# Patient Record
Sex: Female | Born: 1935
Health system: Southern US, Community
[De-identification: ages and names within clinical notes are randomized; demographics above are authoritative.]

## PROBLEM LIST (undated history)

## (undated) DIAGNOSIS — Z8719 Personal history of other diseases of the digestive system: Secondary | ICD-10-CM

## (undated) DIAGNOSIS — K219 Gastro-esophageal reflux disease without esophagitis: Secondary | ICD-10-CM

## (undated) DIAGNOSIS — I440 Atrioventricular block, first degree: Secondary | ICD-10-CM

## (undated) DIAGNOSIS — M25562 Pain in left knee: Secondary | ICD-10-CM

## (undated) DIAGNOSIS — M1990 Unspecified osteoarthritis, unspecified site: Secondary | ICD-10-CM

## (undated) DIAGNOSIS — I4891 Unspecified atrial fibrillation: Secondary | ICD-10-CM

## (undated) DIAGNOSIS — E119 Type 2 diabetes mellitus without complications: Secondary | ICD-10-CM

## (undated) DIAGNOSIS — E785 Hyperlipidemia, unspecified: Secondary | ICD-10-CM

## (undated) DIAGNOSIS — Z974 Presence of external hearing-aid: Secondary | ICD-10-CM

## (undated) DIAGNOSIS — I10 Essential (primary) hypertension: Secondary | ICD-10-CM

## (undated) DIAGNOSIS — IMO0001 Reserved for inherently not codable concepts without codable children: Secondary | ICD-10-CM

## (undated) DIAGNOSIS — H9190 Unspecified hearing loss, unspecified ear: Secondary | ICD-10-CM

## (undated) DIAGNOSIS — Z9889 Other specified postprocedural states: Secondary | ICD-10-CM

## (undated) DIAGNOSIS — S838X2A Sprain of other specified parts of left knee, initial encounter: Secondary | ICD-10-CM

## (undated) HISTORY — PX: CATARACT EXTRACTION W/ INTRAOCULAR LENS IMPLANT: SHX1309

---

## 1959-05-17 HISTORY — PX: ABDOMINAL HYSTERECTOMY: SHX81

## 1998-02-28 ENCOUNTER — Ambulatory Visit (HOSPITAL_COMMUNITY): Admission: RE | Admit: 1998-02-28 | Discharge: 1998-02-28 | Payer: Self-pay | Admitting: Family Medicine

## 1998-03-29 ENCOUNTER — Ambulatory Visit: Admission: RE | Admit: 1998-03-29 | Discharge: 1998-03-29 | Payer: Self-pay | Admitting: Family Medicine

## 1998-07-25 ENCOUNTER — Ambulatory Visit (HOSPITAL_COMMUNITY): Admission: RE | Admit: 1998-07-25 | Discharge: 1998-07-25 | Payer: Self-pay | Admitting: Internal Medicine

## 1998-11-26 ENCOUNTER — Ambulatory Visit (HOSPITAL_COMMUNITY): Admission: RE | Admit: 1998-11-26 | Discharge: 1998-11-26 | Payer: Self-pay | Admitting: Gastroenterology

## 1998-11-26 ENCOUNTER — Encounter: Payer: Self-pay | Admitting: Gastroenterology

## 1998-11-29 ENCOUNTER — Ambulatory Visit (HOSPITAL_COMMUNITY): Admission: RE | Admit: 1998-11-29 | Discharge: 1998-11-29 | Payer: Self-pay | Admitting: Gastroenterology

## 1998-11-29 ENCOUNTER — Encounter: Payer: Self-pay | Admitting: Gastroenterology

## 2000-01-14 ENCOUNTER — Ambulatory Visit (HOSPITAL_COMMUNITY): Admission: RE | Admit: 2000-01-14 | Discharge: 2000-01-14 | Payer: Self-pay | Admitting: Internal Medicine

## 2001-08-25 ENCOUNTER — Ambulatory Visit (HOSPITAL_COMMUNITY): Admission: RE | Admit: 2001-08-25 | Discharge: 2001-08-25 | Payer: Self-pay | Admitting: *Deleted

## 2002-08-26 ENCOUNTER — Ambulatory Visit (HOSPITAL_COMMUNITY): Admission: RE | Admit: 2002-08-26 | Discharge: 2002-08-26 | Payer: Self-pay | Admitting: Family Medicine

## 2002-08-26 ENCOUNTER — Encounter: Payer: Self-pay | Admitting: Family Medicine

## 2003-10-09 ENCOUNTER — Ambulatory Visit (HOSPITAL_COMMUNITY): Admission: RE | Admit: 2003-10-09 | Discharge: 2003-10-09 | Payer: Self-pay | Admitting: Family Medicine

## 2005-05-27 ENCOUNTER — Encounter: Admission: RE | Admit: 2005-05-27 | Discharge: 2005-08-25 | Payer: Self-pay | Admitting: Family Medicine

## 2006-04-16 ENCOUNTER — Encounter: Admission: RE | Admit: 2006-04-16 | Discharge: 2006-04-28 | Payer: Self-pay | Admitting: Family Medicine

## 2007-06-23 ENCOUNTER — Ambulatory Visit (HOSPITAL_COMMUNITY): Admission: RE | Admit: 2007-06-23 | Discharge: 2007-06-23 | Payer: Self-pay | Admitting: *Deleted

## 2008-07-05 ENCOUNTER — Ambulatory Visit (HOSPITAL_COMMUNITY): Admission: RE | Admit: 2008-07-05 | Discharge: 2008-07-05 | Payer: Self-pay | Admitting: Family Medicine

## 2009-09-15 HISTORY — PX: TOTAL KNEE ARTHROPLASTY: SHX125

## 2011-10-22 DIAGNOSIS — H26499 Other secondary cataract, unspecified eye: Secondary | ICD-10-CM | POA: Diagnosis not present

## 2011-10-22 DIAGNOSIS — H2510 Age-related nuclear cataract, unspecified eye: Secondary | ICD-10-CM | POA: Diagnosis not present

## 2011-11-06 DIAGNOSIS — E7800 Pure hypercholesterolemia, unspecified: Secondary | ICD-10-CM | POA: Diagnosis not present

## 2011-11-06 DIAGNOSIS — Z23 Encounter for immunization: Secondary | ICD-10-CM | POA: Diagnosis not present

## 2011-11-06 DIAGNOSIS — E119 Type 2 diabetes mellitus without complications: Secondary | ICD-10-CM | POA: Diagnosis not present

## 2011-11-06 DIAGNOSIS — I10 Essential (primary) hypertension: Secondary | ICD-10-CM | POA: Diagnosis not present

## 2012-01-06 DIAGNOSIS — M1710 Unilateral primary osteoarthritis, unspecified knee: Secondary | ICD-10-CM | POA: Diagnosis not present

## 2012-04-20 DIAGNOSIS — E559 Vitamin D deficiency, unspecified: Secondary | ICD-10-CM | POA: Diagnosis not present

## 2012-04-20 DIAGNOSIS — I10 Essential (primary) hypertension: Secondary | ICD-10-CM | POA: Diagnosis not present

## 2012-04-20 DIAGNOSIS — E7800 Pure hypercholesterolemia, unspecified: Secondary | ICD-10-CM | POA: Diagnosis not present

## 2012-04-20 DIAGNOSIS — Z0000 Encounter for general adult medical examination without abnormal findings: Secondary | ICD-10-CM | POA: Diagnosis not present

## 2012-04-20 DIAGNOSIS — Z01419 Encounter for gynecological examination (general) (routine) without abnormal findings: Secondary | ICD-10-CM | POA: Diagnosis not present

## 2012-04-20 DIAGNOSIS — E119 Type 2 diabetes mellitus without complications: Secondary | ICD-10-CM | POA: Diagnosis not present

## 2012-06-07 DIAGNOSIS — Z23 Encounter for immunization: Secondary | ICD-10-CM | POA: Diagnosis not present

## 2012-10-04 DIAGNOSIS — E119 Type 2 diabetes mellitus without complications: Secondary | ICD-10-CM | POA: Diagnosis not present

## 2012-10-04 DIAGNOSIS — E559 Vitamin D deficiency, unspecified: Secondary | ICD-10-CM | POA: Diagnosis not present

## 2012-10-04 DIAGNOSIS — E7800 Pure hypercholesterolemia, unspecified: Secondary | ICD-10-CM | POA: Diagnosis not present

## 2012-10-04 DIAGNOSIS — I10 Essential (primary) hypertension: Secondary | ICD-10-CM | POA: Diagnosis not present

## 2012-12-28 DIAGNOSIS — H3530 Unspecified macular degeneration: Secondary | ICD-10-CM | POA: Diagnosis not present

## 2012-12-28 DIAGNOSIS — H35379 Puckering of macula, unspecified eye: Secondary | ICD-10-CM | POA: Diagnosis not present

## 2012-12-28 DIAGNOSIS — E119 Type 2 diabetes mellitus without complications: Secondary | ICD-10-CM | POA: Diagnosis not present

## 2012-12-28 DIAGNOSIS — H2510 Age-related nuclear cataract, unspecified eye: Secondary | ICD-10-CM | POA: Diagnosis not present

## 2013-01-04 DIAGNOSIS — H3581 Retinal edema: Secondary | ICD-10-CM | POA: Diagnosis not present

## 2013-01-04 DIAGNOSIS — H35379 Puckering of macula, unspecified eye: Secondary | ICD-10-CM | POA: Diagnosis not present

## 2013-01-04 DIAGNOSIS — H353190 Nonexudative age-related macular degeneration, unspecified eye, stage unspecified: Secondary | ICD-10-CM | POA: Diagnosis not present

## 2013-03-04 DIAGNOSIS — M1710 Unilateral primary osteoarthritis, unspecified knee: Secondary | ICD-10-CM | POA: Diagnosis not present

## 2013-03-11 DIAGNOSIS — M1710 Unilateral primary osteoarthritis, unspecified knee: Secondary | ICD-10-CM | POA: Diagnosis not present

## 2013-03-21 DIAGNOSIS — M1710 Unilateral primary osteoarthritis, unspecified knee: Secondary | ICD-10-CM | POA: Diagnosis not present

## 2013-03-24 ENCOUNTER — Other Ambulatory Visit: Payer: Self-pay | Admitting: Surgical

## 2013-03-29 DIAGNOSIS — Z01810 Encounter for preprocedural cardiovascular examination: Secondary | ICD-10-CM | POA: Diagnosis not present

## 2013-04-06 ENCOUNTER — Encounter (HOSPITAL_BASED_OUTPATIENT_CLINIC_OR_DEPARTMENT_OTHER): Payer: Self-pay | Admitting: *Deleted

## 2013-04-07 ENCOUNTER — Encounter (HOSPITAL_BASED_OUTPATIENT_CLINIC_OR_DEPARTMENT_OTHER): Payer: Self-pay | Admitting: *Deleted

## 2013-04-07 NOTE — Progress Notes (Addendum)
SPOKE W/ PT DAUGHTER, [name]. PT IS HOH OVER PHONE EVEN WITH HEARING AIDS. NPO AFTER MN. ARRIVES AT 1030. NEEDS ISTAT. CURRENT EKG TO BE FAXED FROM [facility] W/ [facility] [phone]. DAUGHTER TO CALL BACK W/ MED. LIST.  PT DAUGHTER , [name], CALLED BACK W/ MED LIST.  INSTRUCTIONS GIVEN FOR PT TO TAKE ATENOLOL AM OF SURG W/ SIP OF WATER.

## 2013-04-10 NOTE — H&P (Signed)
Rachel Hines DOB: 13-Sep-1931  Chief Complaint: left knee pain  History of Present Illness The patient is a 79 year old female who presents with knee complaints. Rachel Hines presented with a chief complaint of left knee pain. In April, she twisted her left knee "in an awkward way" and, since then, she's had left knee pain. She reports before that time she did have occasional knee pain in both knees but since this twisting injury she has had increased pain specifically in the left knee. She reports that she has had some swelling. All the pain is with weight bearing. No pain at rest. She denies locking and catching in the knee. She reports that she does not have any groin pain and no pain coming from her back. No numbness or tingling in the lower extremities.She had a cortisone injection in the left knee which did not get her relief.  MRI shows maceration with extrusion of the medial meniscus and partial tear of the lateral meniscus left knee. This is rather severe. She also has significant tricompartmental arthritis.    Allergies No Known Drug Allergies   Family History Cerebrovascular Accident. mother Congestive Heart Failure. mother Diabetes Mellitus. mother Heart Disease. mother Heart disease in female family member before age 76 Hypertension. mother   Social History Alcohol use. never consumed alcohol Children. 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 widowed Tobacco use. never smoker   Medication History Atenolol (100MG  Tablet, Oral) Active. Irbesartan (300MG  Tablet, Oral) Active. MetFORMIN HCl ER (500MG  Tablet ER 24HR, Oral) Active. Welchol (625MG  Tablet, Oral) Active. Zolpidem Tartrate (10MG  Tablet, Oral) Active.   Past Surgical History Arthroscopy of Knee. right Cataract Surgery. right Hysterectomy. partial (non-cancerous) Tonsillectomy   Past Medical History Diabetes Mellitus, Type  II Gastroesophageal Reflux Disease Hypertension Hypercholesterolemia Osteoporosis   Review of Systems General:Not Present- Chills, Fever, Night Sweats, Appetite Loss, Fatigue, Feeling sick, Weight Gain and Weight Loss. Skin:Not Present- Itching, Rash, Skin Color Changes, Ulcer, Psoriasis and Change in Hair or Nails. HEENT:Present- Sensitivity to light. Not Present- Hearing problems, Nose Bleed and Ringing in the Ears. Neck:Not Present- Swollen Glands and Neck Mass. Respiratory:Not Present- Snoring, Chronic Cough, Bloody sputum and Dyspnea. Cardiovascular:Not Present- Shortness of Breath, Chest Pain, Swelling of Extremities, Leg Cramps and Palpitations. Gastrointestinal:Present- Heartburn. Not Present- Bloody Stool, Abdominal Pain, Vomiting, Nausea and Incontinence of Stool. Female Genitourinary:Not Present- Blood in Urine, Menstrual Irregularities, Frequency, Incontinence and Nocturia. Musculoskeletal:Present- Joint Swelling and Joint Pain. Not Present- Muscle Weakness, Muscle Pain, Joint Stiffness and Back Pain. Neurological:Not Present- Tingling, Numbness, Burning, Tremor, Headaches and Dizziness. Psychiatric:Not Present- Anxiety, Depression and Memory Loss. Endocrine:Not Present- Cold Intolerance, Heat Intolerance, Excessive hunger and Excessive Thirst. Hematology:Not Present- Abnormal Bleeding, Anemia, Blood Clots and Easy Bruising.   Vitals Weight: 187.5 lb Height: 62 in Body Surface Area: 1.93 m Body Mass Index: 34.29 kg/m Pulse: 62 (Regular) BP: 156/76 (Sitting, Left Arm, Standard)    Objective  On physical exam, she's alert and oriented. She's in no acute distress. The right knee is slightly tender over the medial compartment. No swelling. No joint effusion. Ligaments are intact. Pain with ROM. The left knee is tender also over the medial joint line. There is some mild soft tissue swelling but no joint effusion. Ligaments are also intact in  this knee. Sensation and circulation are intact in the lower extremities. There are no motor deficits in the lower extremities. The calves are soft, nontender. There is no pain with ROM of the hips, and  she is not tender over either troch. bursa.On auscultating her lungs, the lungs are clear. Heart is normal sinus rhythm, no murmur. Neck supple, no bruit. EOM intact. Abdomen soft and nontender.     RADIOGRAPHS: AP and lateral views of the knee show that she has lateral and medial joint space narrowing in the right knee as well as medial joint space narrowing in the left knee. She is nearly, if not already, bone on bone in the left knee and the medial compartment. There are no other fractures noted on x-ray.    Assessment & Plan Left knee, osteoarthritis Left knee, medial and lateral meniscus tears She needs to have an arthroscopic clean out and meniscectomy. Dr. Darrelyn Hillock dicussed the risks and benefits of the surgery with the patient and her family. This will be an outpatient procedure.      Dimitri Ped, PA-C

## 2013-04-14 ENCOUNTER — Encounter (HOSPITAL_BASED_OUTPATIENT_CLINIC_OR_DEPARTMENT_OTHER): Payer: Self-pay | Admitting: *Deleted

## 2013-04-14 ENCOUNTER — Encounter (HOSPITAL_BASED_OUTPATIENT_CLINIC_OR_DEPARTMENT_OTHER)
Admission: RE | Disposition: A | Discharge status: Home / Self Care | Payer: Self-pay | Source: Ambulatory Visit | Attending: Orthopedic Surgery

## 2013-04-14 ENCOUNTER — Ambulatory Visit (HOSPITAL_BASED_OUTPATIENT_CLINIC_OR_DEPARTMENT_OTHER): Payer: Medicare Other | Admitting: Certified Registered"

## 2013-04-14 ENCOUNTER — Encounter (HOSPITAL_BASED_OUTPATIENT_CLINIC_OR_DEPARTMENT_OTHER): Payer: Self-pay | Admitting: Certified Registered"

## 2013-04-14 ENCOUNTER — Ambulatory Visit (HOSPITAL_BASED_OUTPATIENT_CLINIC_OR_DEPARTMENT_OTHER)
Admission: RE | Admit: 2013-04-14 | Discharge: 2013-04-14 | Disposition: A | Discharge status: Home / Self Care | Payer: Medicare Other | Source: Ambulatory Visit | Attending: Orthopedic Surgery | Admitting: Orthopedic Surgery

## 2013-04-14 DIAGNOSIS — K219 Gastro-esophageal reflux disease without esophagitis: Secondary | ICD-10-CM | POA: Insufficient documentation

## 2013-04-14 DIAGNOSIS — S83289A Other tear of lateral meniscus, current injury, unspecified knee, initial encounter: Secondary | ICD-10-CM | POA: Diagnosis not present

## 2013-04-14 DIAGNOSIS — IMO0002 Reserved for concepts with insufficient information to code with codable children: Secondary | ICD-10-CM | POA: Insufficient documentation

## 2013-04-14 DIAGNOSIS — X500XXA Overexertion from strenuous movement or load, initial encounter: Secondary | ICD-10-CM | POA: Insufficient documentation

## 2013-04-14 DIAGNOSIS — Z79899 Other long term (current) drug therapy: Secondary | ICD-10-CM | POA: Diagnosis not present

## 2013-04-14 DIAGNOSIS — E119 Type 2 diabetes mellitus without complications: Secondary | ICD-10-CM | POA: Diagnosis not present

## 2013-04-14 DIAGNOSIS — Z8249 Family history of ischemic heart disease and other diseases of the circulatory system: Secondary | ICD-10-CM | POA: Insufficient documentation

## 2013-04-14 DIAGNOSIS — E7800 Pure hypercholesterolemia, unspecified: Secondary | ICD-10-CM | POA: Diagnosis not present

## 2013-04-14 DIAGNOSIS — M2390 Unspecified internal derangement of unspecified knee: Secondary | ICD-10-CM | POA: Diagnosis not present

## 2013-04-14 DIAGNOSIS — M810 Age-related osteoporosis without current pathological fracture: Secondary | ICD-10-CM | POA: Diagnosis not present

## 2013-04-14 DIAGNOSIS — Z823 Family history of stroke: Secondary | ICD-10-CM | POA: Insufficient documentation

## 2013-04-14 DIAGNOSIS — M23269 Derangement of other lateral meniscus due to old tear or injury, unspecified knee: Secondary | ICD-10-CM | POA: Diagnosis present

## 2013-04-14 DIAGNOSIS — I10 Essential (primary) hypertension: Secondary | ICD-10-CM | POA: Insufficient documentation

## 2013-04-14 DIAGNOSIS — M23205 Derangement of unspecified medial meniscus due to old tear or injury, unspecified knee: Secondary | ICD-10-CM | POA: Diagnosis present

## 2013-04-14 DIAGNOSIS — M1710 Unilateral primary osteoarthritis, unspecified knee: Secondary | ICD-10-CM | POA: Insufficient documentation

## 2013-04-14 DIAGNOSIS — M1712 Unilateral primary osteoarthritis, left knee: Secondary | ICD-10-CM | POA: Diagnosis present

## 2013-04-14 DIAGNOSIS — Y929 Unspecified place or not applicable: Secondary | ICD-10-CM | POA: Insufficient documentation

## 2013-04-14 HISTORY — DX: Essential (primary) hypertension: I10

## 2013-04-14 HISTORY — DX: Hyperlipidemia, unspecified: E78.5

## 2013-04-14 HISTORY — DX: Type 2 diabetes mellitus without complications: E11.9

## 2013-04-14 HISTORY — DX: Reserved for inherently not codable concepts without codable children: IMO0001

## 2013-04-14 HISTORY — DX: Unspecified osteoarthritis, unspecified site: M19.90

## 2013-04-14 HISTORY — DX: Sprain of other specified parts of left knee, initial encounter: S83.8X2A

## 2013-04-14 HISTORY — DX: Personal history of other diseases of the digestive system: Z87.19

## 2013-04-14 HISTORY — DX: Presence of external hearing-aid: Z97.4

## 2013-04-14 HISTORY — DX: Unspecified hearing loss, unspecified ear: H91.90

## 2013-04-14 HISTORY — DX: Gastro-esophageal reflux disease without esophagitis: K21.9

## 2013-04-14 HISTORY — DX: Pain in left knee: M25.562

## 2013-04-14 HISTORY — DX: Atrioventricular block, first degree: I44.0

## 2013-04-14 HISTORY — DX: Other specified postprocedural states: Z98.890

## 2013-04-14 HISTORY — PX: KNEE ARTHROSCOPY WITH LATERAL MENISECTOMY: SHX6193

## 2013-04-14 HISTORY — PX: CHONDROPLASTY: SHX5177

## 2013-04-14 HISTORY — PX: KNEE ARTHROSCOPY WITH MEDIAL MENISECTOMY: SHX5651

## 2013-04-14 LAB — POCT I-STAT 4, (NA,K, GLUC, HGB,HCT)
HCT: 42 % (ref 36.0–46.0)
Sodium: 142 mEq/L (ref 135–145)

## 2013-04-14 SURGERY — ARTHROSCOPY, KNEE, WITH MEDIAL MENISCECTOMY
Anesthesia: General | Site: Knee | Laterality: Left | Wound class: Clean

## 2013-04-14 MED ORDER — LIDOCAINE HCL (CARDIAC) 20 MG/ML IV SOLN
INTRAVENOUS | Status: DC | PRN
  Administered 2013-04-14: 50 mg via INTRAVENOUS

## 2013-04-14 MED ORDER — LACTATED RINGERS IV SOLN
INTRAVENOUS | Status: DC
  Filled 2013-04-14: qty 1000

## 2013-04-14 MED ORDER — OXYCODONE-ACETAMINOPHEN 10-325 MG PO TABS: 1.0000 | ORAL_TABLET | ORAL | Status: DC | PRN

## 2013-04-14 MED ORDER — CHLORHEXIDINE GLUCONATE 4 % EX LIQD
60.0000 mL | Freq: Once | CUTANEOUS | Status: DC
  Filled 2013-04-14: qty 60

## 2013-04-14 MED ORDER — PROPOFOL 10 MG/ML IV BOLUS
INTRAVENOUS | Status: DC | PRN
  Administered 2013-04-14: 160 mg via INTRAVENOUS

## 2013-04-14 MED ORDER — CEFAZOLIN SODIUM-DEXTROSE 2-3 GM-% IV SOLR
2.0000 g | INTRAVENOUS | Status: AC
  Administered 2013-04-14: 2 g via INTRAVENOUS
  Filled 2013-04-14: qty 50

## 2013-04-14 MED ORDER — LACTATED RINGERS IV SOLN
INTRAVENOUS | Status: DC
  Administered 2013-04-14 (×2): via INTRAVENOUS
  Filled 2013-04-14: qty 1000

## 2013-04-14 MED ORDER — GLYCOPYRROLATE 0.2 MG/ML IJ SOLN
INTRAMUSCULAR | Status: DC | PRN
  Administered 2013-04-14: 0.2 mg via INTRAVENOUS

## 2013-04-14 MED ORDER — OXYCODONE-ACETAMINOPHEN 5-325 MG PO TABS
1.0000 | ORAL_TABLET | ORAL | Status: DC | PRN
  Administered 2013-04-14: 1 via ORAL
  Filled 2013-04-14: qty 1

## 2013-04-14 MED ORDER — SODIUM CHLORIDE 0.9 % IR SOLN
Status: DC | PRN
  Administered 2013-04-14: 6000 mL

## 2013-04-14 MED ORDER — DEXAMETHASONE SODIUM PHOSPHATE 4 MG/ML IJ SOLN
INTRAMUSCULAR | Status: DC | PRN
  Administered 2013-04-14: 8 mg via INTRAVENOUS

## 2013-04-14 MED ORDER — BACITRACIN-NEOMYCIN-POLYMYXIN 400-5-5000 EX OINT
TOPICAL_OINTMENT | CUTANEOUS | Status: DC | PRN
  Administered 2013-04-14: 1 via TOPICAL

## 2013-04-14 MED ORDER — ONDANSETRON HCL 4 MG/2ML IJ SOLN
INTRAMUSCULAR | Status: DC | PRN
  Administered 2013-04-14: 4 mg via INTRAVENOUS

## 2013-04-14 MED ORDER — BUPIVACAINE-EPINEPHRINE 0.25% -1:200000 IJ SOLN
INTRAMUSCULAR | Status: DC | PRN
  Administered 2013-04-14: 30 mL

## 2013-04-14 MED ORDER — FENTANYL CITRATE 0.05 MG/ML IJ SOLN
INTRAMUSCULAR | Status: DC | PRN
  Administered 2013-04-14: 25 ug via INTRAVENOUS
  Administered 2013-04-14: 50 ug via INTRAVENOUS
  Administered 2013-04-14 (×3): 25 ug via INTRAVENOUS

## 2013-04-14 MED ORDER — FENTANYL CITRATE 0.05 MG/ML IJ SOLN
25.0000 ug | INTRAMUSCULAR | Status: DC | PRN
  Administered 2013-04-14: 25 ug via INTRAVENOUS
  Filled 2013-04-14: qty 1

## 2013-04-14 SURGICAL SUPPLY — 45 items
BANDAGE ELASTIC 4 VELCRO ST LF (GAUZE/BANDAGES/DRESSINGS) ×3 IMPLANT
BANDAGE ELASTIC 6 VELCRO ST LF (GAUZE/BANDAGES/DRESSINGS) ×3 IMPLANT
BLADE CUDA 5.5 (BLADE) IMPLANT
BLADE CUTTER GATOR 3.5 (BLADE) IMPLANT
BLADE CUTTER MENIS 5.5 (BLADE) IMPLANT
BLADE GREAT WHITE 4.2 (BLADE) ×3 IMPLANT
BLADE SURG 15 STRL LF DISP TIS (BLADE) IMPLANT
BLADE SURG 15 STRL SS (BLADE)
BNDG COHESIVE 6X5 TAN NS LF (GAUZE/BANDAGES/DRESSINGS) ×3 IMPLANT
CANISTER SUCT LVC 12 LTR MEDI- (MISCELLANEOUS) ×3 IMPLANT
CANISTER SUCTION 2500CC (MISCELLANEOUS) ×3 IMPLANT
CLOTH BEACON ORANGE TIMEOUT ST (SAFETY) ×3 IMPLANT
DRAPE ARTHROSCOPY W/POUCH 114 (DRAPES) ×3 IMPLANT
DRAPE LG THREE QUARTER DISP (DRAPES) ×3 IMPLANT
DRSG EMULSION OIL 3X3 NADH (GAUZE/BANDAGES/DRESSINGS) ×3 IMPLANT
DRSG PAD ABDOMINAL 8X10 ST (GAUZE/BANDAGES/DRESSINGS) ×3 IMPLANT
DURAPREP 26ML APPLICATOR (WOUND CARE) ×3 IMPLANT
ELECT MENISCUS 165MM 90D (ELECTRODE) IMPLANT
ELECT REM PT RETURN 9FT ADLT (ELECTROSURGICAL)
ELECTRODE REM PT RTRN 9FT ADLT (ELECTROSURGICAL) IMPLANT
GLOVE BIO SURGEON STRL SZ 6 (GLOVE) ×3 IMPLANT
GLOVE BIO SURGEON STRL SZ 6.5 (GLOVE) ×3 IMPLANT
GLOVE ECLIPSE 8.0 STRL XLNG CF (GLOVE) ×3 IMPLANT
GLOVE INDICATOR 7.5 STRL GRN (GLOVE) ×3 IMPLANT
GLOVE INDICATOR 8.5 STRL (GLOVE) ×6 IMPLANT
GLOVE SURG ORTHO 6.0 STRL STRW (GLOVE) ×3 IMPLANT
GOWN PREVENTION PLUS LG XLONG (DISPOSABLE) ×3 IMPLANT
GOWN STRL REIN XL XLG (GOWN DISPOSABLE) ×3 IMPLANT
IV NS IRRIG 3000ML ARTHROMATIC (IV SOLUTION) ×6 IMPLANT
KNEE WRAP E Z 3 GEL PACK (MISCELLANEOUS) ×3 IMPLANT
PACK ARTHROSCOPY DSU (CUSTOM PROCEDURE TRAY) ×3 IMPLANT
PACK BASIN DAY SURGERY FS (CUSTOM PROCEDURE TRAY) ×3 IMPLANT
PAD CAST 4YDX4 CTTN HI CHSV (CAST SUPPLIES) ×2 IMPLANT
PADDING CAST COTTON 4X4 STRL (CAST SUPPLIES) ×1
PADDING CAST COTTON 6X4 STRL (CAST SUPPLIES) ×3 IMPLANT
PENCIL BUTTON HOLSTER BLD 10FT (ELECTRODE) IMPLANT
SET ARTHROSCOPY TUBING (MISCELLANEOUS) ×1
SET ARTHROSCOPY TUBING LN (MISCELLANEOUS) ×2 IMPLANT
SPONGE GAUZE 4X4 12PLY (GAUZE/BANDAGES/DRESSINGS) ×3 IMPLANT
SPONGE SURGIFOAM ABS GEL 12-7 (HEMOSTASIS) IMPLANT
SUT ETHILON 3 0 PS 1 (SUTURE) ×3 IMPLANT
SYR CONTROL 10ML LL (SYRINGE) IMPLANT
TOWEL OR 17X24 6PK STRL BLUE (TOWEL DISPOSABLE) ×3 IMPLANT
WAND 90 DEG TURBOVAC W/CORD (SURGICAL WAND) ×6 IMPLANT
WATER STERILE IRR 500ML POUR (IV SOLUTION) ×3 IMPLANT

## 2013-04-14 NOTE — Op Note (Signed)
NAMEKAZI, MONTORO                ACCOUNT NO.:  [id number]  MEDICAL RECORD NO.:  [id number]  LOCATION:                                 FACILITY:  PHYSICIAN:  Georges Lynch. Darrelyn Hillock, M.D.     DATE OF BIRTH:  DATE OF PROCEDURE:  04/14/2013 DATE OF DISCHARGE:                              OPERATIVE REPORT   SURGEON:  Georges Lynch. Michaeljames Milnes, MD  ASSISTANT:  OR tech.  PREOPERATIVE DIAGNOSES: 1. Severe osteoarthritis of the left knee. 2. Bucket-handle tear, medial meniscus, left knee. 3. Bucket-handle tear, lateral meniscus, left knee.  POSTOPERATIVE DIAGNOSES: 1. Severe osteoarthritis of the left knee. 2. Bucket-handle tear, medial meniscus, left knee. 3. Bucket-handle tear, lateral meniscus, left knee.  OPERATION: 1. Diagnostic arthroscopy, left knee. 2. Medial meniscectomy, left knee. 3. Lateral meniscectomy, left knee. 4. Abrasion chondroplasty of the medial femoral condyle, left knee.  PROCEDURE:  Under general anesthesia, routine orthopedic prep and drape in left lower extremity was carried out.  The patient's left leg was placed in a knee holder.  At this time, the appropriate time-out was first carried out.  I also marked the appropriate left leg in the holding area.  After the prep and draping, a small punctate incision was made in the superior lateral aspect of the knee joint.  The inflow cannula was inserted.  Knee was distended with saline.  Another small punctate incision was made in the anterolateral joint.  The arthroscope was entered from lateral approach and a complete diagnostic arthroscopy was carried out.  I went up in the suprapatellar pouch.  She had chronic synovitis.  I went down in the lateral joint.  She had a large tear of the lateral meniscus.  I introduced the ArthroCare and did a lateral meniscectomy.  She had early arthritic changes.  The cruciates were intact.  I went over the medial joint where the main problem was she had severe osteoarthritic changes  with loss of cartilage of the tibial plateau and the medial femoral condyle.  I did an abrasion chondroplasty of the medial femoral condyle.  I also did a partial medial meniscectomy of a bucket-handle tear of the medial meniscus.  I thoroughly irrigated out the area. There were no other gross abnormalities noted.  I then closed all 3 punctate incisions with 3-0 nylon suture.  I injected a mixture of 30 mL of 0.25% Marcaine with epinephrine into the knee joint.  Sterile Neosporin dressings were applied after the wounds were closed.  The patient had 1 g of IV Ancef preop.          ______________________________ Georges Lynch. Darrelyn Hillock, M.D.     RAG/MEDQ  D:  04/14/2013  T:  04/14/2013  Job:  [id number]

## 2013-04-14 NOTE — Brief Op Note (Signed)
04/14/2013  1:47 PM  PATIENT:  Rachel Hines  79 y.o. female  PRE-OPERATIVE DIAGNOSIS:  left knee medial and lateral meniscal tear and osteoarthritis  POST-OPERATIVE DIAGNOSIS:  left knee medial and lateral meniscal tear and osteoarthritis.   PROCEDURE:  Procedure(s): LEFT KNEE ARTHROSCOPY WITH MEDIAL MENISECTOMY (Left)  ABRATION CHONDROPLASTY OF MEDIAL CHONDIAL KNEE ARTHROSCOPY WITH LATERAL MENISECTOMY (Left).Abraison Chondroplasty of Medial Femoral Condyle and Medial and lateral meniscectomies.  SURGEON:  Surgeon(s) and Role:    * Jacki Cones, MD - Primary  :   ASSISTANTS:OR Tech  ANESTHESIA:   general  EBL:  Total I/O In: 1000 [I.V.:1000] Out: -   BLOOD ADMINISTERED:none  DRAINS: none   LOCAL MEDICATIONS USED:  MARCAINE 30cc of 0.25% with Epinephrine.    SPECIMEN:  No Specimen  DISPOSITION OF SPECIMEN:  N/A  COUNTS:  YES  TOURNIQUET:  * No tourniquets in log *  DICTATION: .Other Dictation: Dictation Number [id number]  PLAN OF CARE: Discharge to home after PACU  PATIENT DISPOSITION:  PACU - hemodynamically stable.   Delay start of Pharmacological VTE agent (>24hrs) due to surgical blood loss or risk of bleeding: yes

## 2013-04-14 NOTE — Anesthesia Preprocedure Evaluation (Signed)
Anesthesia Evaluation  Patient identified by MRN, date of birth, ID band Patient awake    Reviewed: Allergy & Precautions, H&P , NPO status , Patient's Chart, lab work & pertinent test results, reviewed documented beta blocker date and time   Airway Mallampati: II TM Distance: >3 FB Neck ROM: full    Dental  (+) Edentulous Upper and Edentulous Lower   Pulmonary neg pulmonary ROS,  breath sounds clear to auscultation  Pulmonary exam normal       Cardiovascular Exercise Tolerance: Good hypertension, Pt. on medications and Pt. on home beta blockers Rhythm:regular Rate:Normal     Neuro/Psych negative neurological ROS  negative psych ROS   GI/Hepatic negative GI ROS, Neg liver ROS,   Endo/Other  diabetes, Well Controlled, Type 2, Oral Hypoglycemic Agents  Renal/GU negative Renal ROS  negative genitourinary   Musculoskeletal   Abdominal   Peds  Hematology negative hematology ROS (+)   Anesthesia Other Findings   Reproductive/Obstetrics negative OB ROS                           Anesthesia Physical Anesthesia Plan  ASA: III  Anesthesia Plan: General   Post-op Pain Management:    Induction: Intravenous  Airway Management Planned: LMA  Additional Equipment:   Intra-op Plan:   Post-operative Plan:   Informed Consent: I have reviewed the patients History and Physical, chart, labs and discussed the procedure including the risks, benefits and alternatives for the proposed anesthesia with the patient or authorized representative who has indicated his/her understanding and acceptance.   Dental Advisory Given  Plan Discussed with: CRNA and Surgeon  Anesthesia Plan Comments:         Anesthesia Quick Evaluation

## 2013-04-14 NOTE — Interval H&P Note (Signed)
History and Physical Interval Note:  04/14/2013 12:55 PM  Rachel Hines  has presented today for surgery, with the diagnosis of left knee medial and lateral meniscal tear   The various methods of treatment have been discussed with the patient and family. After consideration of risks, benefits and other options for treatment, the patient has consented to  Procedure(s): LEFT KNEE ARTHROSCOPY WITH MEDIAL AND LATERAL MENISCECTOMY (Left) as a surgical intervention .  The patient's history has been reviewed, patient examined, no change in status, stable for surgery.  I have reviewed the patient's chart and labs.  Questions were answered to the patient's satisfaction.     [place] A

## 2013-04-14 NOTE — Anesthesia Procedure Notes (Signed)
Procedure Name: LMA Insertion Date/Time: 04/14/2013 1:05 PM Performed by: Renella Cunas D Pre-anesthesia Checklist: Patient identified, Emergency Drugs available, Suction available and Patient being monitored Patient Re-evaluated:Patient Re-evaluated prior to inductionOxygen Delivery Method: Circle System Utilized Preoxygenation: Pre-oxygenation with 100% oxygen Intubation Type: IV induction Ventilation: Mask ventilation without difficulty LMA: LMA inserted LMA Size: 4.0 Number of attempts: 1 Airway Equipment and Method: bite block Placement Confirmation: positive ETCO2 Tube secured with: Tape Dental Injury: Teeth and Oropharynx as per pre-operative assessment

## 2013-04-14 NOTE — Transfer of Care (Signed)
Immediate Anesthesia Transfer of Care Note  Patient: Rachel Hines  Procedure(s) Performed: Procedure(s) (LRB): LEFT KNEE ARTHROSCOPY WITH MEDIAL MENISECTOMY (Left)  ABRATION CHONDROPLASTY OF MEDIAL CHONDIAL KNEE ARTHROSCOPY WITH LATERAL MENISECTOMY (Left)  Patient Location: PACU  Anesthesia Type: General  Level of Consciousness: awake, oriented, sedated and patient cooperative  Airway & Oxygen Therapy: Patient Spontanous Breathing and Patient connected to face mask oxygen  Post-op Assessment: Report given to PACU RN and Post -op Vital signs reviewed and stable  Post vital signs: Reviewed and stable  Complications: No apparent anesthesia complications

## 2013-04-15 ENCOUNTER — Encounter (HOSPITAL_BASED_OUTPATIENT_CLINIC_OR_DEPARTMENT_OTHER): Payer: Self-pay | Admitting: Orthopedic Surgery

## 2013-04-15 NOTE — Anesthesia Postprocedure Evaluation (Signed)
Anesthesia Post Note  Patient: Rachel Hines  Procedure(s) Performed: Procedure(s) (LRB): LEFT KNEE ARTHROSCOPY WITH MEDIAL MENISECTOMY (Left)  ABRATION CHONDROPLASTY OF MEDIAL CHONDIAL KNEE ARTHROSCOPY WITH LATERAL MENISECTOMY (Left)  Anesthesia type: General  Patient location: PACU  Post pain: Pain level controlled  Post assessment: Post-op Vital signs reviewed  Last Vitals: BP 152/70  Pulse 73  Temp(Src) 36.1 C (Oral)  Resp 16  Ht 5\' 2"  (1.575 m)  Wt 188 lb (85.276 kg)  BMI 34.38 kg/m2  SpO2 96%  Post vital signs: Reviewed  Level of consciousness: sedated  Complications: No apparent anesthesia complications

## 2013-04-25 DIAGNOSIS — Z4789 Encounter for other orthopedic aftercare: Secondary | ICD-10-CM | POA: Diagnosis not present

## 2013-05-04 DIAGNOSIS — G4700 Insomnia, unspecified: Secondary | ICD-10-CM | POA: Diagnosis not present

## 2013-05-04 DIAGNOSIS — E7800 Pure hypercholesterolemia, unspecified: Secondary | ICD-10-CM | POA: Diagnosis not present

## 2013-05-04 DIAGNOSIS — E559 Vitamin D deficiency, unspecified: Secondary | ICD-10-CM | POA: Diagnosis not present

## 2013-05-04 DIAGNOSIS — E119 Type 2 diabetes mellitus without complications: Secondary | ICD-10-CM | POA: Diagnosis not present

## 2013-05-04 DIAGNOSIS — Z1331 Encounter for screening for depression: Secondary | ICD-10-CM | POA: Diagnosis not present

## 2013-05-04 DIAGNOSIS — I10 Essential (primary) hypertension: Secondary | ICD-10-CM | POA: Diagnosis not present

## 2013-05-28 DIAGNOSIS — Z23 Encounter for immunization: Secondary | ICD-10-CM | POA: Diagnosis not present

## 2013-11-22 DIAGNOSIS — I10 Essential (primary) hypertension: Secondary | ICD-10-CM | POA: Diagnosis not present

## 2013-11-22 DIAGNOSIS — E559 Vitamin D deficiency, unspecified: Secondary | ICD-10-CM | POA: Diagnosis not present

## 2013-11-22 DIAGNOSIS — E119 Type 2 diabetes mellitus without complications: Secondary | ICD-10-CM | POA: Diagnosis not present

## 2013-11-22 DIAGNOSIS — E7800 Pure hypercholesterolemia, unspecified: Secondary | ICD-10-CM | POA: Diagnosis not present

## 2014-03-29 DIAGNOSIS — R21 Rash and other nonspecific skin eruption: Secondary | ICD-10-CM | POA: Diagnosis not present

## 2014-03-29 DIAGNOSIS — E7800 Pure hypercholesterolemia, unspecified: Secondary | ICD-10-CM | POA: Diagnosis not present

## 2014-03-29 DIAGNOSIS — G4700 Insomnia, unspecified: Secondary | ICD-10-CM | POA: Diagnosis not present

## 2014-03-29 DIAGNOSIS — E119 Type 2 diabetes mellitus without complications: Secondary | ICD-10-CM | POA: Diagnosis not present

## 2014-03-29 DIAGNOSIS — M1710 Unilateral primary osteoarthritis, unspecified knee: Secondary | ICD-10-CM | POA: Diagnosis not present

## 2014-03-29 DIAGNOSIS — I10 Essential (primary) hypertension: Secondary | ICD-10-CM | POA: Diagnosis not present

## 2014-05-11 DIAGNOSIS — L570 Actinic keratosis: Secondary | ICD-10-CM | POA: Diagnosis not present

## 2014-05-11 DIAGNOSIS — Z85828 Personal history of other malignant neoplasm of skin: Secondary | ICD-10-CM | POA: Diagnosis not present

## 2014-05-11 DIAGNOSIS — D485 Neoplasm of uncertain behavior of skin: Secondary | ICD-10-CM | POA: Diagnosis not present

## 2014-05-11 DIAGNOSIS — C4441 Basal cell carcinoma of skin of scalp and neck: Secondary | ICD-10-CM | POA: Diagnosis not present

## 2014-05-11 DIAGNOSIS — L723 Sebaceous cyst: Secondary | ICD-10-CM | POA: Diagnosis not present

## 2014-05-11 DIAGNOSIS — I8390 Asymptomatic varicose veins of unspecified lower extremity: Secondary | ICD-10-CM | POA: Diagnosis not present

## 2014-05-11 DIAGNOSIS — L821 Other seborrheic keratosis: Secondary | ICD-10-CM | POA: Diagnosis not present

## 2014-06-01 DIAGNOSIS — H2510 Age-related nuclear cataract, unspecified eye: Secondary | ICD-10-CM | POA: Diagnosis not present

## 2014-06-01 DIAGNOSIS — Z961 Presence of intraocular lens: Secondary | ICD-10-CM | POA: Diagnosis not present

## 2014-06-01 DIAGNOSIS — H353190 Nonexudative age-related macular degeneration, unspecified eye, stage unspecified: Secondary | ICD-10-CM | POA: Diagnosis not present

## 2014-06-01 DIAGNOSIS — H40019 Open angle with borderline findings, low risk, unspecified eye: Secondary | ICD-10-CM | POA: Diagnosis not present

## 2014-06-07 DIAGNOSIS — C4441 Basal cell carcinoma of skin of scalp and neck: Secondary | ICD-10-CM | POA: Diagnosis not present

## 2014-06-16 DIAGNOSIS — H26491 Other secondary cataract, right eye: Secondary | ICD-10-CM | POA: Diagnosis not present

## 2014-07-05 DIAGNOSIS — H401232 Low-tension glaucoma, bilateral, moderate stage: Secondary | ICD-10-CM | POA: Diagnosis not present

## 2014-07-07 ENCOUNTER — Encounter (INDEPENDENT_AMBULATORY_CARE_PROVIDER_SITE_OTHER): Payer: Medicare Other | Admitting: Ophthalmology

## 2014-07-07 DIAGNOSIS — H3531 Nonexudative age-related macular degeneration: Secondary | ICD-10-CM | POA: Diagnosis not present

## 2014-07-07 DIAGNOSIS — H43813 Vitreous degeneration, bilateral: Secondary | ICD-10-CM

## 2014-07-07 DIAGNOSIS — H35371 Puckering of macula, right eye: Secondary | ICD-10-CM

## 2014-07-07 DIAGNOSIS — H33302 Unspecified retinal break, left eye: Secondary | ICD-10-CM

## 2014-07-07 DIAGNOSIS — H35033 Hypertensive retinopathy, bilateral: Secondary | ICD-10-CM

## 2014-07-07 DIAGNOSIS — I10 Essential (primary) hypertension: Secondary | ICD-10-CM | POA: Diagnosis not present

## 2014-07-12 DIAGNOSIS — Z1389 Encounter for screening for other disorder: Secondary | ICD-10-CM | POA: Diagnosis not present

## 2014-07-12 DIAGNOSIS — G4700 Insomnia, unspecified: Secondary | ICD-10-CM | POA: Diagnosis not present

## 2014-07-12 DIAGNOSIS — E780 Pure hypercholesterolemia: Secondary | ICD-10-CM | POA: Diagnosis not present

## 2014-07-12 DIAGNOSIS — E119 Type 2 diabetes mellitus without complications: Secondary | ICD-10-CM | POA: Diagnosis not present

## 2014-07-12 DIAGNOSIS — Z23 Encounter for immunization: Secondary | ICD-10-CM | POA: Diagnosis not present

## 2014-07-12 DIAGNOSIS — I10 Essential (primary) hypertension: Secondary | ICD-10-CM | POA: Diagnosis not present

## 2014-07-12 DIAGNOSIS — Z0000 Encounter for general adult medical examination without abnormal findings: Secondary | ICD-10-CM | POA: Diagnosis not present

## 2014-07-21 ENCOUNTER — Ambulatory Visit (INDEPENDENT_AMBULATORY_CARE_PROVIDER_SITE_OTHER): Payer: Medicare Other | Admitting: Ophthalmology

## 2014-08-23 DIAGNOSIS — H401232 Low-tension glaucoma, bilateral, moderate stage: Secondary | ICD-10-CM | POA: Diagnosis not present

## 2014-08-23 DIAGNOSIS — H2510 Age-related nuclear cataract, unspecified eye: Secondary | ICD-10-CM | POA: Diagnosis not present

## 2014-08-23 DIAGNOSIS — H524 Presbyopia: Secondary | ICD-10-CM | POA: Diagnosis not present

## 2014-11-14 DIAGNOSIS — Z23 Encounter for immunization: Secondary | ICD-10-CM | POA: Diagnosis not present

## 2014-11-14 DIAGNOSIS — E780 Pure hypercholesterolemia: Secondary | ICD-10-CM | POA: Diagnosis not present

## 2014-11-14 DIAGNOSIS — I10 Essential (primary) hypertension: Secondary | ICD-10-CM | POA: Diagnosis not present

## 2014-11-14 DIAGNOSIS — E119 Type 2 diabetes mellitus without complications: Secondary | ICD-10-CM | POA: Diagnosis not present

## 2014-11-14 DIAGNOSIS — G4700 Insomnia, unspecified: Secondary | ICD-10-CM | POA: Diagnosis not present

## 2015-02-23 DIAGNOSIS — E782 Mixed hyperlipidemia: Secondary | ICD-10-CM | POA: Diagnosis not present

## 2015-05-22 DIAGNOSIS — Z23 Encounter for immunization: Secondary | ICD-10-CM | POA: Diagnosis not present

## 2015-05-22 DIAGNOSIS — S91209A Unspecified open wound of unspecified toe(s) with damage to nail, initial encounter: Secondary | ICD-10-CM | POA: Diagnosis not present

## 2015-05-29 DIAGNOSIS — Z23 Encounter for immunization: Secondary | ICD-10-CM | POA: Diagnosis not present

## 2015-08-15 DIAGNOSIS — E559 Vitamin D deficiency, unspecified: Secondary | ICD-10-CM | POA: Diagnosis not present

## 2015-08-15 DIAGNOSIS — Z0000 Encounter for general adult medical examination without abnormal findings: Secondary | ICD-10-CM | POA: Diagnosis not present

## 2015-08-15 DIAGNOSIS — E7800 Pure hypercholesterolemia, unspecified: Secondary | ICD-10-CM | POA: Diagnosis not present

## 2015-08-15 DIAGNOSIS — Z1389 Encounter for screening for other disorder: Secondary | ICD-10-CM | POA: Diagnosis not present

## 2015-08-15 DIAGNOSIS — E119 Type 2 diabetes mellitus without complications: Secondary | ICD-10-CM | POA: Diagnosis not present

## 2015-08-15 DIAGNOSIS — M129 Arthropathy, unspecified: Secondary | ICD-10-CM | POA: Diagnosis not present

## 2015-08-15 DIAGNOSIS — I10 Essential (primary) hypertension: Secondary | ICD-10-CM | POA: Diagnosis not present

## 2015-08-15 DIAGNOSIS — G4700 Insomnia, unspecified: Secondary | ICD-10-CM | POA: Diagnosis not present

## 2015-08-15 DIAGNOSIS — Z7984 Long term (current) use of oral hypoglycemic drugs: Secondary | ICD-10-CM | POA: Diagnosis not present

## 2016-04-15 DIAGNOSIS — Z7984 Long term (current) use of oral hypoglycemic drugs: Secondary | ICD-10-CM | POA: Diagnosis not present

## 2016-04-15 DIAGNOSIS — G4700 Insomnia, unspecified: Secondary | ICD-10-CM | POA: Diagnosis not present

## 2016-04-15 DIAGNOSIS — E119 Type 2 diabetes mellitus without complications: Secondary | ICD-10-CM | POA: Diagnosis not present

## 2016-04-15 DIAGNOSIS — M6780 Other specified disorders of synovium and tendon, unspecified site: Secondary | ICD-10-CM | POA: Diagnosis not present

## 2016-04-15 DIAGNOSIS — E7800 Pure hypercholesterolemia, unspecified: Secondary | ICD-10-CM | POA: Diagnosis not present

## 2016-04-15 DIAGNOSIS — I10 Essential (primary) hypertension: Secondary | ICD-10-CM | POA: Diagnosis not present

## 2016-06-03 DIAGNOSIS — Z23 Encounter for immunization: Secondary | ICD-10-CM | POA: Diagnosis not present

## 2016-08-26 DIAGNOSIS — I10 Essential (primary) hypertension: Secondary | ICD-10-CM | POA: Diagnosis not present

## 2016-08-30 DIAGNOSIS — R600 Localized edema: Secondary | ICD-10-CM | POA: Diagnosis not present

## 2016-08-30 DIAGNOSIS — I10 Essential (primary) hypertension: Secondary | ICD-10-CM | POA: Diagnosis not present

## 2016-11-21 DIAGNOSIS — R040 Epistaxis: Secondary | ICD-10-CM | POA: Diagnosis not present

## 2017-03-31 DIAGNOSIS — R2241 Localized swelling, mass and lump, right lower limb: Secondary | ICD-10-CM | POA: Diagnosis not present

## 2017-03-31 DIAGNOSIS — G4700 Insomnia, unspecified: Secondary | ICD-10-CM | POA: Diagnosis not present

## 2017-03-31 DIAGNOSIS — E7800 Pure hypercholesterolemia, unspecified: Secondary | ICD-10-CM | POA: Diagnosis not present

## 2017-03-31 DIAGNOSIS — M2550 Pain in unspecified joint: Secondary | ICD-10-CM | POA: Diagnosis not present

## 2017-03-31 DIAGNOSIS — E119 Type 2 diabetes mellitus without complications: Secondary | ICD-10-CM | POA: Diagnosis not present

## 2017-03-31 DIAGNOSIS — I10 Essential (primary) hypertension: Secondary | ICD-10-CM | POA: Diagnosis not present

## 2017-03-31 DIAGNOSIS — Z7984 Long term (current) use of oral hypoglycemic drugs: Secondary | ICD-10-CM | POA: Diagnosis not present

## 2017-04-04 DIAGNOSIS — L308 Other specified dermatitis: Secondary | ICD-10-CM | POA: Diagnosis not present

## 2017-04-22 DIAGNOSIS — B359 Dermatophytosis, unspecified: Secondary | ICD-10-CM | POA: Diagnosis not present

## 2017-04-29 DIAGNOSIS — H524 Presbyopia: Secondary | ICD-10-CM | POA: Diagnosis not present

## 2017-04-29 DIAGNOSIS — H353132 Nonexudative age-related macular degeneration, bilateral, intermediate dry stage: Secondary | ICD-10-CM | POA: Diagnosis not present

## 2017-04-29 DIAGNOSIS — H35371 Puckering of macula, right eye: Secondary | ICD-10-CM | POA: Diagnosis not present

## 2017-04-29 DIAGNOSIS — H2512 Age-related nuclear cataract, left eye: Secondary | ICD-10-CM | POA: Diagnosis not present

## 2017-04-29 DIAGNOSIS — H33312 Horseshoe tear of retina without detachment, left eye: Secondary | ICD-10-CM | POA: Diagnosis not present

## 2017-06-17 DIAGNOSIS — Z23 Encounter for immunization: Secondary | ICD-10-CM | POA: Diagnosis not present

## 2017-10-01 DIAGNOSIS — Z7984 Long term (current) use of oral hypoglycemic drugs: Secondary | ICD-10-CM | POA: Diagnosis not present

## 2017-10-01 DIAGNOSIS — I10 Essential (primary) hypertension: Secondary | ICD-10-CM | POA: Diagnosis not present

## 2017-10-01 DIAGNOSIS — Z1389 Encounter for screening for other disorder: Secondary | ICD-10-CM | POA: Diagnosis not present

## 2017-10-01 DIAGNOSIS — Z23 Encounter for immunization: Secondary | ICD-10-CM | POA: Diagnosis not present

## 2017-10-01 DIAGNOSIS — R8290 Unspecified abnormal findings in urine: Secondary | ICD-10-CM | POA: Diagnosis not present

## 2017-10-01 DIAGNOSIS — Z0000 Encounter for general adult medical examination without abnormal findings: Secondary | ICD-10-CM | POA: Diagnosis not present

## 2017-10-01 DIAGNOSIS — G4700 Insomnia, unspecified: Secondary | ICD-10-CM | POA: Diagnosis not present

## 2017-10-01 DIAGNOSIS — E7800 Pure hypercholesterolemia, unspecified: Secondary | ICD-10-CM | POA: Diagnosis not present

## 2017-10-01 DIAGNOSIS — E119 Type 2 diabetes mellitus without complications: Secondary | ICD-10-CM | POA: Diagnosis not present

## 2017-12-09 DIAGNOSIS — J069 Acute upper respiratory infection, unspecified: Secondary | ICD-10-CM | POA: Diagnosis not present

## 2017-12-09 DIAGNOSIS — I10 Essential (primary) hypertension: Secondary | ICD-10-CM | POA: Diagnosis not present

## 2017-12-14 ENCOUNTER — Other Ambulatory Visit: Payer: Self-pay | Admitting: Family Medicine

## 2017-12-14 DIAGNOSIS — I10 Essential (primary) hypertension: Secondary | ICD-10-CM

## 2017-12-16 ENCOUNTER — Other Ambulatory Visit: Payer: Medicare Other

## 2017-12-21 DIAGNOSIS — S46812A Strain of other muscles, fascia and tendons at shoulder and upper arm level, left arm, initial encounter: Secondary | ICD-10-CM | POA: Diagnosis not present

## 2017-12-23 ENCOUNTER — Other Ambulatory Visit: Payer: Medicare Other

## 2018-01-18 ENCOUNTER — Ambulatory Visit
Admission: RE | Admit: 2018-01-18 | Discharge: 2018-01-18 | Disposition: A | Discharge status: Home / Self Care | Payer: Medicare Other | Source: Ambulatory Visit | Attending: Family Medicine | Admitting: Family Medicine

## 2018-01-18 DIAGNOSIS — I10 Essential (primary) hypertension: Secondary | ICD-10-CM

## 2018-01-18 DIAGNOSIS — I150 Renovascular hypertension: Secondary | ICD-10-CM | POA: Diagnosis not present

## 2018-03-31 DIAGNOSIS — I10 Essential (primary) hypertension: Secondary | ICD-10-CM | POA: Diagnosis not present

## 2018-03-31 DIAGNOSIS — H6122 Impacted cerumen, left ear: Secondary | ICD-10-CM | POA: Diagnosis not present

## 2018-03-31 DIAGNOSIS — E7800 Pure hypercholesterolemia, unspecified: Secondary | ICD-10-CM | POA: Diagnosis not present

## 2018-03-31 DIAGNOSIS — G4700 Insomnia, unspecified: Secondary | ICD-10-CM | POA: Diagnosis not present

## 2018-03-31 DIAGNOSIS — E1169 Type 2 diabetes mellitus with other specified complication: Secondary | ICD-10-CM | POA: Diagnosis not present

## 2018-04-07 DIAGNOSIS — H35371 Puckering of macula, right eye: Secondary | ICD-10-CM | POA: Diagnosis not present

## 2018-04-07 DIAGNOSIS — H353132 Nonexudative age-related macular degeneration, bilateral, intermediate dry stage: Secondary | ICD-10-CM | POA: Diagnosis not present

## 2018-04-07 DIAGNOSIS — H524 Presbyopia: Secondary | ICD-10-CM | POA: Diagnosis not present

## 2018-04-07 DIAGNOSIS — H33312 Horseshoe tear of retina without detachment, left eye: Secondary | ICD-10-CM | POA: Diagnosis not present

## 2018-04-07 DIAGNOSIS — H2512 Age-related nuclear cataract, left eye: Secondary | ICD-10-CM | POA: Diagnosis not present

## 2018-04-26 DIAGNOSIS — H2512 Age-related nuclear cataract, left eye: Secondary | ICD-10-CM | POA: Diagnosis not present

## 2018-04-26 DIAGNOSIS — H2513 Age-related nuclear cataract, bilateral: Secondary | ICD-10-CM | POA: Diagnosis not present

## 2018-05-03 DIAGNOSIS — H25812 Combined forms of age-related cataract, left eye: Secondary | ICD-10-CM | POA: Diagnosis not present

## 2018-05-03 DIAGNOSIS — H2512 Age-related nuclear cataract, left eye: Secondary | ICD-10-CM | POA: Diagnosis not present

## 2018-05-28 DIAGNOSIS — I10 Essential (primary) hypertension: Secondary | ICD-10-CM | POA: Diagnosis not present

## 2018-06-17 DIAGNOSIS — I10 Essential (primary) hypertension: Secondary | ICD-10-CM | POA: Diagnosis not present

## 2018-06-17 DIAGNOSIS — Z23 Encounter for immunization: Secondary | ICD-10-CM | POA: Diagnosis not present

## 2018-07-23 DIAGNOSIS — E119 Type 2 diabetes mellitus without complications: Secondary | ICD-10-CM | POA: Diagnosis not present

## 2018-07-23 DIAGNOSIS — Z7984 Long term (current) use of oral hypoglycemic drugs: Secondary | ICD-10-CM | POA: Diagnosis not present

## 2018-10-05 DIAGNOSIS — E1169 Type 2 diabetes mellitus with other specified complication: Secondary | ICD-10-CM | POA: Diagnosis not present

## 2018-10-05 DIAGNOSIS — I10 Essential (primary) hypertension: Secondary | ICD-10-CM | POA: Diagnosis not present

## 2018-10-05 DIAGNOSIS — E7800 Pure hypercholesterolemia, unspecified: Secondary | ICD-10-CM | POA: Diagnosis not present

## 2018-10-05 DIAGNOSIS — Z1389 Encounter for screening for other disorder: Secondary | ICD-10-CM | POA: Diagnosis not present

## 2018-10-05 DIAGNOSIS — I700 Atherosclerosis of aorta: Secondary | ICD-10-CM | POA: Diagnosis not present

## 2018-10-05 DIAGNOSIS — G4700 Insomnia, unspecified: Secondary | ICD-10-CM | POA: Diagnosis not present

## 2018-10-05 DIAGNOSIS — Z0000 Encounter for general adult medical examination without abnormal findings: Secondary | ICD-10-CM | POA: Diagnosis not present

## 2018-10-05 DIAGNOSIS — M129 Arthropathy, unspecified: Secondary | ICD-10-CM | POA: Diagnosis not present

## 2018-10-05 DIAGNOSIS — Z7984 Long term (current) use of oral hypoglycemic drugs: Secondary | ICD-10-CM | POA: Diagnosis not present

## 2019-04-13 DIAGNOSIS — I10 Essential (primary) hypertension: Secondary | ICD-10-CM | POA: Diagnosis not present

## 2019-04-13 DIAGNOSIS — E1169 Type 2 diabetes mellitus with other specified complication: Secondary | ICD-10-CM | POA: Diagnosis not present

## 2019-04-13 DIAGNOSIS — G4700 Insomnia, unspecified: Secondary | ICD-10-CM | POA: Diagnosis not present

## 2019-04-13 DIAGNOSIS — E7800 Pure hypercholesterolemia, unspecified: Secondary | ICD-10-CM | POA: Diagnosis not present

## 2019-04-13 DIAGNOSIS — Z7984 Long term (current) use of oral hypoglycemic drugs: Secondary | ICD-10-CM | POA: Diagnosis not present

## 2019-04-29 DIAGNOSIS — B353 Tinea pedis: Secondary | ICD-10-CM | POA: Diagnosis not present

## 2019-04-29 DIAGNOSIS — B354 Tinea corporis: Secondary | ICD-10-CM | POA: Diagnosis not present

## 2019-06-27 DIAGNOSIS — Z23 Encounter for immunization: Secondary | ICD-10-CM | POA: Diagnosis not present

## 2019-10-14 ENCOUNTER — Ambulatory Visit: Payer: Medicare Other

## 2019-10-20 DIAGNOSIS — I700 Atherosclerosis of aorta: Secondary | ICD-10-CM | POA: Diagnosis not present

## 2019-10-20 DIAGNOSIS — G4700 Insomnia, unspecified: Secondary | ICD-10-CM | POA: Diagnosis not present

## 2019-10-20 DIAGNOSIS — E7800 Pure hypercholesterolemia, unspecified: Secondary | ICD-10-CM | POA: Diagnosis not present

## 2019-10-20 DIAGNOSIS — E1169 Type 2 diabetes mellitus with other specified complication: Secondary | ICD-10-CM | POA: Diagnosis not present

## 2019-10-20 DIAGNOSIS — Z1389 Encounter for screening for other disorder: Secondary | ICD-10-CM | POA: Diagnosis not present

## 2019-10-20 DIAGNOSIS — Z0000 Encounter for general adult medical examination without abnormal findings: Secondary | ICD-10-CM | POA: Diagnosis not present

## 2019-10-20 DIAGNOSIS — I10 Essential (primary) hypertension: Secondary | ICD-10-CM | POA: Diagnosis not present

## 2019-10-21 ENCOUNTER — Ambulatory Visit: Payer: Medicare Other | Attending: Internal Medicine

## 2019-10-21 ENCOUNTER — Ambulatory Visit: Payer: Medicare Other

## 2019-10-21 DIAGNOSIS — Z23 Encounter for immunization: Secondary | ICD-10-CM | POA: Insufficient documentation

## 2019-10-21 NOTE — Progress Notes (Signed)
   Covid-19 Vaccination Clinic  Name:  Rachel Hines    MRN: [id number] DOB: Mar 12, 1931  10/21/2019  [name] was observed post Covid-19 immunization for 15 minutes without incidence. She was provided with Vaccine Information Sheet and instruction to access the V-Safe system.   [name] was instructed to call 911 with any severe reactions post vaccine: Marland Kitchen Difficulty breathing  . Swelling of your face and throat  . A fast heartbeat  . A bad rash all over your body  . Dizziness and weakness    Immunizations Administered    Name Date Dose VIS Date Route   Pfizer COVID-19 Vaccine 10/21/2019  9:05 AM 0.3 mL 08/26/2019 Intramuscular   Manufacturer: [place]   Lot: [id number]   [place]: [id number]

## 2019-11-04 ENCOUNTER — Ambulatory Visit: Payer: Medicare Other

## 2019-11-15 ENCOUNTER — Ambulatory Visit: Payer: Medicare Other | Attending: Internal Medicine

## 2019-11-15 DIAGNOSIS — Z23 Encounter for immunization: Secondary | ICD-10-CM | POA: Insufficient documentation

## 2019-11-15 NOTE — Progress Notes (Signed)
   Covid-19 Vaccination Clinic  Name:  Rachel Hines    MRN: [id number] DOB: 1931-08-16  11/15/2019  Ms. Rachel Hines was observed post Covid-19 immunization for 15 minutes without incident. She was provided with Vaccine Information Sheet and instruction to access the V-Safe system.   Ms. Rachel Hines was instructed to call 911 with any severe reactions post vaccine: Marland Kitchen Difficulty breathing  . Swelling of face and throat  . A fast heartbeat  . A bad rash all over body  . Dizziness and weakness   Immunizations Administered    Name Date Dose VIS Date Route   Pfizer COVID-19 Vaccine 11/15/2019  9:27 AM 0.3 mL 08/26/2019 Intramuscular   Manufacturer: [place]   Lot: [id number]   [place]: [id number]

## 2019-11-28 DIAGNOSIS — I10 Essential (primary) hypertension: Secondary | ICD-10-CM | POA: Diagnosis not present

## 2020-04-18 DIAGNOSIS — I700 Atherosclerosis of aorta: Secondary | ICD-10-CM | POA: Diagnosis not present

## 2020-04-18 DIAGNOSIS — E7800 Pure hypercholesterolemia, unspecified: Secondary | ICD-10-CM | POA: Diagnosis not present

## 2020-04-18 DIAGNOSIS — I10 Essential (primary) hypertension: Secondary | ICD-10-CM | POA: Diagnosis not present

## 2020-04-18 DIAGNOSIS — Z7984 Long term (current) use of oral hypoglycemic drugs: Secondary | ICD-10-CM | POA: Diagnosis not present

## 2020-04-18 DIAGNOSIS — E1169 Type 2 diabetes mellitus with other specified complication: Secondary | ICD-10-CM | POA: Diagnosis not present

## 2020-04-18 DIAGNOSIS — Z789 Other specified health status: Secondary | ICD-10-CM | POA: Diagnosis not present

## 2020-04-18 DIAGNOSIS — G4700 Insomnia, unspecified: Secondary | ICD-10-CM | POA: Diagnosis not present

## 2020-08-06 DIAGNOSIS — Z23 Encounter for immunization: Secondary | ICD-10-CM | POA: Diagnosis not present

## 2020-08-18 ENCOUNTER — Ambulatory Visit: Payer: Medicare Other | Attending: Internal Medicine

## 2020-08-18 DIAGNOSIS — Z23 Encounter for immunization: Secondary | ICD-10-CM

## 2020-08-18 NOTE — Progress Notes (Signed)
   Covid-19 Vaccination Clinic  Name:  Rachel Hines    MRN: [id number] DOB: [birth year]  08/18/2020  [name] was observed post Covid-19 immunization for 15 minutes without incident. She was provided with Vaccine Information Sheet and instruction to access the V-Safe system.   [name] was instructed to call 911 with any severe reactions post vaccine: Marland Kitchen Difficulty breathing  . Swelling of face and throat  . A fast heartbeat  . A bad rash all over body  . Dizziness and weakness   Immunizations Administered    Name Date Dose VIS Date Route   Pfizer COVID-19 Vaccine 08/18/2020 10:54 AM 0.3 mL 07/04/2020 Intramuscular   Manufacturer: [place]   Lot: [id number]   [place]: [id number]

## 2021-02-13 DIAGNOSIS — Z1389 Encounter for screening for other disorder: Secondary | ICD-10-CM | POA: Diagnosis not present

## 2021-02-13 DIAGNOSIS — G4700 Insomnia, unspecified: Secondary | ICD-10-CM | POA: Diagnosis not present

## 2021-02-13 DIAGNOSIS — E7800 Pure hypercholesterolemia, unspecified: Secondary | ICD-10-CM | POA: Diagnosis not present

## 2021-02-13 DIAGNOSIS — I700 Atherosclerosis of aorta: Secondary | ICD-10-CM | POA: Diagnosis not present

## 2021-02-13 DIAGNOSIS — I10 Essential (primary) hypertension: Secondary | ICD-10-CM | POA: Diagnosis not present

## 2021-02-13 DIAGNOSIS — E1169 Type 2 diabetes mellitus with other specified complication: Secondary | ICD-10-CM | POA: Diagnosis not present

## 2021-02-13 DIAGNOSIS — Z7984 Long term (current) use of oral hypoglycemic drugs: Secondary | ICD-10-CM | POA: Diagnosis not present

## 2021-02-13 DIAGNOSIS — Z789 Other specified health status: Secondary | ICD-10-CM | POA: Diagnosis not present

## 2021-02-13 DIAGNOSIS — Z0000 Encounter for general adult medical examination without abnormal findings: Secondary | ICD-10-CM | POA: Diagnosis not present

## 2021-02-18 DIAGNOSIS — H353122 Nonexudative age-related macular degeneration, left eye, intermediate dry stage: Secondary | ICD-10-CM | POA: Diagnosis not present

## 2021-02-18 DIAGNOSIS — H353114 Nonexudative age-related macular degeneration, right eye, advanced atrophic with subfoveal involvement: Secondary | ICD-10-CM | POA: Diagnosis not present

## 2021-02-18 DIAGNOSIS — H40011 Open angle with borderline findings, low risk, right eye: Secondary | ICD-10-CM | POA: Diagnosis not present

## 2021-02-18 DIAGNOSIS — H35371 Puckering of macula, right eye: Secondary | ICD-10-CM | POA: Diagnosis not present

## 2021-02-21 DIAGNOSIS — E875 Hyperkalemia: Secondary | ICD-10-CM | POA: Diagnosis not present

## 2021-02-28 ENCOUNTER — Encounter (INDEPENDENT_AMBULATORY_CARE_PROVIDER_SITE_OTHER): Payer: Medicare Other | Admitting: Ophthalmology

## 2021-02-28 ENCOUNTER — Other Ambulatory Visit: Payer: Self-pay

## 2021-02-28 DIAGNOSIS — H33302 Unspecified retinal break, left eye: Secondary | ICD-10-CM

## 2021-02-28 DIAGNOSIS — H43813 Vitreous degeneration, bilateral: Secondary | ICD-10-CM

## 2021-02-28 DIAGNOSIS — H353132 Nonexudative age-related macular degeneration, bilateral, intermediate dry stage: Secondary | ICD-10-CM | POA: Diagnosis not present

## 2021-02-28 DIAGNOSIS — I10 Essential (primary) hypertension: Secondary | ICD-10-CM

## 2021-02-28 DIAGNOSIS — H35033 Hypertensive retinopathy, bilateral: Secondary | ICD-10-CM

## 2021-10-07 DIAGNOSIS — I10 Essential (primary) hypertension: Secondary | ICD-10-CM | POA: Diagnosis not present

## 2021-10-07 DIAGNOSIS — E1169 Type 2 diabetes mellitus with other specified complication: Secondary | ICD-10-CM | POA: Diagnosis not present

## 2021-10-07 DIAGNOSIS — G4700 Insomnia, unspecified: Secondary | ICD-10-CM | POA: Diagnosis not present

## 2021-10-07 DIAGNOSIS — Z7984 Long term (current) use of oral hypoglycemic drugs: Secondary | ICD-10-CM | POA: Diagnosis not present

## 2021-10-07 DIAGNOSIS — E7800 Pure hypercholesterolemia, unspecified: Secondary | ICD-10-CM | POA: Diagnosis not present

## 2021-11-21 ENCOUNTER — Encounter (HOSPITAL_COMMUNITY): Payer: Self-pay | Admitting: Emergency Medicine

## 2021-11-21 ENCOUNTER — Emergency Department (HOSPITAL_COMMUNITY)
Admission: EM | Admit: 2021-11-21 | Discharge: 2021-11-21 | Disposition: A | Discharge status: Home / Self Care | Payer: Medicare Other | Attending: Emergency Medicine | Admitting: Emergency Medicine

## 2021-11-21 ENCOUNTER — Emergency Department (HOSPITAL_COMMUNITY): Payer: Medicare Other

## 2021-11-21 ENCOUNTER — Other Ambulatory Visit: Payer: Self-pay

## 2021-11-21 DIAGNOSIS — I10 Essential (primary) hypertension: Secondary | ICD-10-CM | POA: Insufficient documentation

## 2021-11-21 DIAGNOSIS — R079 Chest pain, unspecified: Secondary | ICD-10-CM | POA: Insufficient documentation

## 2021-11-21 DIAGNOSIS — S199XXA Unspecified injury of neck, initial encounter: Secondary | ICD-10-CM | POA: Diagnosis not present

## 2021-11-21 DIAGNOSIS — W108XXA Fall (on) (from) other stairs and steps, initial encounter: Secondary | ICD-10-CM | POA: Diagnosis not present

## 2021-11-21 DIAGNOSIS — S42032A Displaced fracture of lateral end of left clavicle, initial encounter for closed fracture: Secondary | ICD-10-CM | POA: Diagnosis not present

## 2021-11-21 DIAGNOSIS — S299XXA Unspecified injury of thorax, initial encounter: Secondary | ICD-10-CM | POA: Diagnosis not present

## 2021-11-21 DIAGNOSIS — Z7984 Long term (current) use of oral hypoglycemic drugs: Secondary | ICD-10-CM | POA: Insufficient documentation

## 2021-11-21 DIAGNOSIS — S4992XA Unspecified injury of left shoulder and upper arm, initial encounter: Secondary | ICD-10-CM | POA: Diagnosis present

## 2021-11-21 DIAGNOSIS — E119 Type 2 diabetes mellitus without complications: Secondary | ICD-10-CM | POA: Diagnosis not present

## 2021-11-21 DIAGNOSIS — S0990XA Unspecified injury of head, initial encounter: Secondary | ICD-10-CM | POA: Diagnosis not present

## 2021-11-21 DIAGNOSIS — S42002A Fracture of unspecified part of left clavicle, initial encounter for closed fracture: Secondary | ICD-10-CM | POA: Diagnosis not present

## 2021-11-21 DIAGNOSIS — M25519 Pain in unspecified shoulder: Secondary | ICD-10-CM | POA: Diagnosis not present

## 2021-11-21 DIAGNOSIS — R531 Weakness: Secondary | ICD-10-CM | POA: Diagnosis not present

## 2021-11-21 DIAGNOSIS — W19XXXA Unspecified fall, initial encounter: Secondary | ICD-10-CM | POA: Diagnosis not present

## 2021-11-21 LAB — CBC WITH DIFFERENTIAL/PLATELET
Abs Immature Granulocytes: 0.09 10*3/uL — ABNORMAL HIGH (ref 0.00–0.07)
Basophils Absolute: 0 10*3/uL (ref 0.0–0.1)
Basophils Relative: 0 %
Eosinophils Absolute: 0 10*3/uL (ref 0.0–0.5)
Eosinophils Relative: 0 %
HCT: 44.7 % (ref 36.0–46.0)
Hemoglobin: 14.7 g/dL (ref 12.0–15.0)
Immature Granulocytes: 1 %
Lymphocytes Relative: 7 %
Lymphs Abs: 0.9 10*3/uL (ref 0.7–4.0)
MCH: 30.7 pg (ref 26.0–34.0)
MCHC: 32.9 g/dL (ref 30.0–36.0)
MCV: 93.3 fL (ref 80.0–100.0)
Monocytes Absolute: 0.8 10*3/uL (ref 0.1–1.0)
Monocytes Relative: 6 %
Neutro Abs: 11.5 10*3/uL — ABNORMAL HIGH (ref 1.7–7.7)
Neutrophils Relative %: 86 %
Platelets: 227 10*3/uL (ref 150–400)
RBC: 4.79 MIL/uL (ref 3.87–5.11)
RDW: 12.5 % (ref 11.5–15.5)
WBC: 13.3 10*3/uL — ABNORMAL HIGH (ref 4.0–10.5)
nRBC: 0 % (ref 0.0–0.2)

## 2021-11-21 LAB — BASIC METABOLIC PANEL
Anion gap: 7 (ref 5–15)
BUN: 27 mg/dL — ABNORMAL HIGH (ref 8–23)
CO2: 27 mmol/L (ref 22–32)
Calcium: 9.3 mg/dL (ref 8.9–10.3)
Chloride: 102 mmol/L (ref 98–111)
Creatinine, Ser: 0.97 mg/dL (ref 0.44–1.00)
GFR, Estimated: 56 mL/min — ABNORMAL LOW (ref >60)
Glucose, Bld: 136 mg/dL — ABNORMAL HIGH (ref 70–99)
Potassium: 4.2 mmol/L (ref 3.5–5.1)
Sodium: 136 mmol/L (ref 135–145)

## 2021-11-21 MED ORDER — SODIUM CHLORIDE 0.9 % IV SOLN: Freq: Once | INTRAVENOUS | Status: AC

## 2021-11-21 MED ORDER — IOHEXOL 300 MG/ML  SOLN
75.0000 mL | Freq: Once | INTRAMUSCULAR | Status: AC | PRN
  Administered 2021-11-21: 03:00:00 75 mL via INTRAVENOUS

## 2021-11-21 MED ORDER — HYDROMORPHONE HCL 1 MG/ML IJ SOLN
0.5000 mg | Freq: Once | INTRAMUSCULAR | Status: AC
  Administered 2021-11-21: 02:00:00 0.5 mg via INTRAVENOUS
  Filled 2021-11-21: qty 1

## 2021-11-21 MED ORDER — OXYCODONE HCL 5 MG PO TABS: 2.5000 mg | ORAL_TABLET | Freq: Four times a day (QID) | ORAL | 0 refills | Status: AC | PRN

## 2021-11-21 MED ORDER — SODIUM CHLORIDE (PF) 0.9 % IJ SOLN
INTRAMUSCULAR | Status: AC
  Filled 2021-11-21: qty 50

## 2021-11-21 MED ORDER — HYDROCODONE-ACETAMINOPHEN 5-325 MG PO TABS
1.0000 | ORAL_TABLET | Freq: Once | ORAL | Status: AC
  Administered 2021-11-21: 05:00:00 1 via ORAL
  Filled 2021-11-21: qty 1

## 2021-11-21 MED ORDER — ACETAMINOPHEN 325 MG PO TABS
650.0000 mg | ORAL_TABLET | Freq: Once | ORAL | Status: AC
  Administered 2021-11-21: 05:00:00 650 mg via ORAL
  Filled 2021-11-21: qty 2

## 2021-11-21 NOTE — ED Provider Notes (Addendum)
[facility]-EMERGENCY DEPT Provider Note  CSN: [id number] Arrival date & time: 11/21/21 0053  Chief Complaint(s) Fall and Shoulder Injury  HPI Rachel Hines is a 88 y.o. female    The history is provided by the patient and a relative.  Shoulder Injury This is a new problem. The current episode started 1 to 2 hours ago. The problem occurs constantly. The problem has not changed since onset.Associated symptoms include chest pain. Pertinent negatives include no abdominal pain, no headaches and no shortness of breath. Exacerbated by: movement. Nothing relieves the symptoms. She has tried nothing for the symptoms.   Lost her balance while walking up the stairs (one step up) and fell backwards onto her left shoulder.  Patient hit her head but denied any loss of consciousness.  Endorses mild neck pain and headache she is not anticoagulated.  No other injuries. Past Medical History Past Medical History:  Diagnosis Date   Acute meniscal injury of left knee    Arthritis    GERD (gastroesophageal reflux disease)    H/O hiatal hernia    Heart block AV first degree    History of esophageal dilatation    Hyperlipidemia    Hypertension    Impaired hearing    Left knee pain    severe   Type 2 diabetes mellitus (HCC)    Wears hearing aid    Patient Active Problem List   Diagnosis Date Noted   Osteoarthritis of left knee 04/14/2013   Meniscus, lateral, bucket handle tear, old 04/14/2013   Meniscus, medial, bucket handle tear, old 04/14/2013   Home Medication(s) Prior to Admission medications   Medication Sig Start Date End Date Taking? Authorizing Provider  oxyCODONE (ROXICODONE) 5 MG immediate release tablet Take 0.5-1 tablets (2.5-5 mg total) by mouth every 6 (six) hours as needed for up to 5 days for severe pain. 11/21/21 11/26/21 Yes Yeimi Debnam, Amadeo Garnet, MD  atenolol (TENORMIN) 100 MG tablet Take 100 mg by mouth every morning.    [provider]  irbesartan  (AVAPRO) 300 MG tablet Take 300 mg by mouth at bedtime.    [provider]  metFORMIN (GLUCOPHAGE-XR) 500 MG 24 hr tablet Take 500 mg by mouth daily with breakfast.    [provider]  zolpidem (AMBIEN) 10 MG tablet Take 10 mg by mouth at bedtime as needed for sleep.    [provider]                                                                                                                                    Allergies Atorvastatin  Review of Systems Review of Systems  Respiratory:  Negative for shortness of breath.   Cardiovascular:  Positive for chest pain.  Gastrointestinal:  Negative for abdominal pain.  Neurological:  Negative for headaches.  As noted in HPI  Physical Exam Vital Signs  I have reviewed the triage vital signs  BP (!) 164/64 (BP Location: Right Arm)   Pulse (!) 57   Temp (!) 97.4 F (36.3 C) (Oral)   Resp 16   Ht 5\' 4"  (1.626 m)   Wt 77.1 kg   SpO2 95%   BMI 29.18 kg/m   Physical Exam Constitutional:      General: She is not in acute distress.    Appearance: She is well-developed. She is not diaphoretic.  HENT:     Head: Normocephalic and atraumatic.     Right Ear: External ear normal.     Left Ear: External ear normal.     Nose: Nose normal.  Eyes:     General: No scleral icterus.       Right eye: No discharge.        Left eye: No discharge.     Conjunctiva/sclera: Conjunctivae normal.     Pupils: Pupils are equal, round, and reactive to light.  Cardiovascular:     Rate and Rhythm: Normal rate and regular rhythm.     Pulses:          Radial pulses are 2+ on the right side and 2+ on the left side.       Dorsalis pedis pulses are 2+ on the right side and 2+ on the left side.     Heart sounds: Normal heart sounds. No murmur heard.   No friction rub. No gallop.  Pulmonary:     Effort: Pulmonary effort is normal. No respiratory distress.     Breath sounds: Normal breath sounds. No stridor. No wheezing.  Abdominal:      General: There is no distension.     Palpations: Abdomen is soft.     Tenderness: There is no abdominal tenderness.  Musculoskeletal:     Left shoulder: Swelling, tenderness and bony tenderness present. No laceration. Decreased range of motion.       Arms:     Cervical back: Normal range of motion and neck supple. No bony tenderness. Muscular tenderness present. No spinous process tenderness.     Thoracic back: No bony tenderness.     Lumbar back: No bony tenderness.     Comments: Clavicles stable. Chest stable to AP/Lat compression. Pelvis stable to Lat compression. No obvious extremity deformity. No chest or abdominal wall contusion.  Skin:    General: Skin is warm and dry.     Findings: No erythema or rash.  Neurological:     Mental Status: She is alert and oriented to person, place, and time.     Comments: Moving all extremities    ED Results and Treatments Labs (all labs ordered are listed, but only abnormal results are displayed) Labs Reviewed  CBC WITH DIFFERENTIAL/PLATELET - Abnormal; Notable for the following components:      Result Value   WBC 13.3 (*)    Neutro Abs 11.5 (*)    Abs Immature Granulocytes 0.09 (*)    All other components within normal limits  BASIC METABOLIC PANEL - Abnormal; Notable for the following components:   Glucose, Bld 136 (*)    BUN 27 (*)    GFR, Estimated 56 (*)    All other components within normal limits  EKG  EKG Interpretation  Date/Time:    Ventricular Rate:    PR Interval:    QRS Duration:   QT Interval:    QTC Calculation:   R Axis:     Text Interpretation:         Radiology CT Head Wo Contrast  Result Date: 11/21/2021 CLINICAL DATA:  Initial evaluation for acute trauma, fall. EXAM: CT HEAD WITHOUT CONTRAST TECHNIQUE: Contiguous axial images were obtained from the base of the skull through the vertex  without intravenous contrast. RADIATION DOSE REDUCTION: This exam was performed according to the departmental dose-optimization program which includes automated exposure control, adjustment of the mA and/or kV according to patient size and/or use of iterative reconstruction technique. COMPARISON:  None available. FINDINGS: Brain: Generalized age-related cerebral atrophy with chronic small vessel ischemic disease. Dilated perivascular space present at the inferior left basal ganglia. No acute intracranial hemorrhage. No acute large vessel territory infarct. No mass lesion, mass effect or midline shift. No hydrocephalus or extra-axial fluid collection. Vascular: No hyperdense vessel. Scattered vascular calcifications noted within the carotid siphons. Skull: Scalp soft tissues within normal limits.  Calvarium intact. Sinuses/Orbits: Globes and orbital soft tissues demonstrate no acute finding. Paranasal sinuses are largely clear. No significant mastoid effusion. Other: None. IMPRESSION: 1. No acute intracranial abnormality. 2. Generalized age-related cerebral atrophy with chronic small vessel ischemic disease. Electronically Signed   By: Rise Mu M.D.   On: 11/21/2021 03:17   CT Chest W Contrast  Result Date: 11/21/2021 CLINICAL DATA:  Initial evaluation for acute trauma, fall. EXAM: CT CHEST WITH CONTRAST TECHNIQUE: Multidetector CT imaging of the chest was performed during intravenous contrast administration. RADIATION DOSE REDUCTION: This exam was performed according to the departmental dose-optimization program which includes automated exposure control, adjustment of the mA and/or kV according to patient size and/or use of iterative reconstruction technique. CONTRAST:  75mL OMNIPAQUE IOHEXOL 300 MG/ML  SOLN COMPARISON:  None available. FINDINGS: Cardiovascular: Diffuse aneurysmal dilatation of the intrathoracic aorta is seen. Ascending aorta measures up to 5 cm in diameter. Aortic arch dilated up to  3.5 cm. Descending intrathoracic aorta dilated up to 4.5 cm. No acute aortic pathology. Visualized great vessels intact. Cardiomegaly noted. Trace pericardial effusion. Limited assessment of the pulmonary arterial tree grossly unremarkable. Mediastinum/Nodes: Thyroid within normal limits. No enlarged mediastinal, hilar, or axillary lymph nodes. Esophagus within normal limits. Small hiatal hernia noted. Lungs/Pleura: Tracheobronchial tree intact and patent. Lungs normally inflated. Scattered subsegmental atelectatic changes noted within the right middle lobe, lingula, and lower lobes bilaterally. No focal infiltrates or consolidative airspace disease. No pulmonary edema or pleural effusion. No pneumothorax. No worrisome pulmonary nodule or mass. Upper Abdomen: Visualized upper abdomen demonstrates no acute finding. Musculoskeletal: Comminuted fracture involving the distal third of the left clavicle. Associated soft tissue stranding within the adjacent left supraclavicular region. No frank active contrast extravasation. No other acute osseous abnormality. Visualized ribs are intact. Exaggeration of the normal thoracic kyphosis noted. No discrete or worrisome osseous lesions. IMPRESSION: 1. Comminuted fracture involving the distal third of the left clavicle. Associated soft tissue stranding within the adjacent left supraclavicular region without active contrast extravasation. 2. No other acute traumatic injury or other abnormality within the thorax. 3. Cardiomegaly with trace pericardial effusion. 4. Aneurysmal dilatation of the intrathoracic aorta, with the ascending aorta measuring up to 5 cm in diameter. Ascending thoracic aortic aneurysm. Recommend semi-annual imaging followup by CTA or MRA and referral to cardiothoracic surgery if not already obtained. This recommendation follows 2010  ACCF/AHA/AATS/ACR/ASA/SCA/SCAI/SIR/STS/SVM Guidelines for the Diagnosis and Management of Patients With Thoracic Aortic Disease.  Circulation. 2010; 121: Z660-Y301. Aortic aneurysm NOS (ICD10-I71.9) Electronically Signed   By: Rise Mu M.D.   On: 11/21/2021 03:42   CT Cervical Spine Wo Contrast  Result Date: 11/21/2021 CLINICAL DATA:  Initial evaluation for acute traumatic, fall. EXAM: CT CERVICAL SPINE WITHOUT CONTRAST TECHNIQUE: Multidetector CT imaging of the cervical spine was performed without intravenous contrast. Multiplanar CT image reconstructions were also generated. RADIATION DOSE REDUCTION: This exam was performed according to the departmental dose-optimization program which includes automated exposure control, adjustment of the mA and/or kV according to patient size and/or use of iterative reconstruction technique. COMPARISON:  None available. FINDINGS: Alignment: Trace stepwise anterolisthesis of C5 on C6 through C7 on T1. Trace anterolisthesis of C3 on C4 noted as well. Findings likely chronic and facet mediated. Vertebral bodies otherwise normally aligned with preservation of the normal cervical lordosis. Skull base and vertebrae: Skull base intact. Normal C1-2 articulations are preserved in the dens is intact. Vertebral body heights maintained. No acute fracture. Soft tissues and spinal canal: Scattered soft tissue stranding about the partially visualized left shoulder, likely posttraumatic, incompletely assessed on this exam. Soft tissues of the neck demonstrate no other acute finding. No abnormal prevertebral edema. Spinal canal within normal limits. Disc levels: Mild for age multilevel spondylosis without significant spinal stenosis. Upper chest: Better evaluated on concomitant CT of the chest. Partially visualized lung apices are clear. Other: None. IMPRESSION: 1. No acute traumatic injury within the cervical spine. 2. Scattered soft tissue stranding about the partially visualized left shoulder/supraclavicular region, likely posttraumatic. Finding incompletely assessed on this exam. 3. Mild for age  multilevel spondylosis without significant spinal stenosis. Electronically Signed   By: Rise Mu M.D.   On: 11/21/2021 03:31    Pertinent labs & imaging results that were available during my care of the patient were reviewed by me and considered in my medical decision making (see MDM for details).  Medications Ordered in ED Medications  sodium chloride (PF) 0.9 % injection (has no administration in time range)  HYDROmorphone (DILAUDID) injection 0.5 mg (0.5 mg Intravenous Given 11/21/21 0200)  0.9 %  sodium chloride infusion (0 mLs Intravenous Stopped 11/21/21 0523)  iohexol (OMNIPAQUE) 300 MG/ML solution 75 mL (75 mLs Intravenous Contrast Given 11/21/21 0247)  acetaminophen (TYLENOL) tablet 650 mg (650 mg Oral Given 11/21/21 0510)  HYDROcodone-acetaminophen (NORCO/VICODIN) 5-325 MG per tablet 1 tablet (1 tablet Oral Given 11/21/21 0510)                                                                                                                                     Procedures Procedures  (including critical care time)  Medical Decision Making / ED Course    Complexity of Problem:  Co-morbidities/SDOH that complicate the patient evaluation/care: elderly  Additional history obtained: none  Patient's presenting problem/concern and DDX listed below:  Fall  We will need to assess for any intracranial or cervical injury.  We will also assess for any shoulder and chest wall injury.     Complexity of Data:   Cardiac Monitoring: None  Laboratory Tests ordered listed below with my independent interpretation: None   Imaging Studies ordered listed below with my independent interpretation: CT head and cervical spine negative for any acute injuries CT of the chest notable for comminuted distal left clavicle fracture.  No other injuries noted.  Radiology confirmed.  Also noted the patient has a 5 cm ascending aortic aneurysm.  Patient and daughter made aware.     ED Course:     Hospitalization Considered:  Yes if she has significant injuries  Assessment, Intervention, and Reassessment: Clavicle fracture  IV pain and oral pain meds Sling provided. Ortho follow up.   Final Clinical Impression(s) / ED Diagnoses Final diagnoses:  Closed displaced fracture of acromial end of left clavicle, initial encounter   The patient appears reasonably screened and/or stabilized for discharge and I doubt any other medical condition or other [facility] requiring further screening, evaluation, or treatment in the ED at this time prior to discharge. Safe for discharge with strict return precautions.  Disposition: Discharge  Condition: Good  I have discussed the results, Dx and Tx plan with the patient/family who expressed understanding and agree(s) with the plan. Discharge instructions discussed at length. The patient/family was given strict return precautions who verbalized understanding of the instructions. No further questions at time of discharge.    ED Discharge Orders          Ordered    oxyCODONE (ROXICODONE) 5 MG immediate release tablet  Every 6 hours PRN        11/21/21 0519            [facility] narcotic database reviewed and no active prescriptions noted.   Follow Up: Loreli Slot, MD [address] [place] [address] [place] [address] [phone]  Call  to schedule an appointment to follow up for aortic aneurysm.  Samson Frederic, MD [address] [place] [address] [place] [address] [phone]  Call  to schedule an appointment for close follow up clavicle fracture           This chart was dictated using voice recognition software.  Despite best efforts to proofread,  errors can occur which can change the documentation meaning.      Nira Conn, MD 11/22/21 [phone]

## 2021-11-21 NOTE — ED Notes (Signed)
MD Cardama at bedside talking to family of patient and patient.  ?

## 2021-11-21 NOTE — ED Triage Notes (Signed)
BIB EMS from home c/o fall 30 mins pta.  C/o left shoulder pain.  EMS reports deformity.  No LOC, no blood thinners.   ? ?158/80 ?92% ?68 ?16 ?

## 2021-11-21 NOTE — Discharge Instructions (Addendum)
For pain control you may take at 1000 mg of Tylenol every 8 hours scheduled.  In addition you can take 0.5 to 1 tablet of Oxycodone every 6 hours as needed for pain not controlled with the scheduled Tylenol. ? ?During the workup we noted incidental findings on your imaging that would require you to follow-up for further evaluation/management: ?Aneurysmal dilatation of the intrathoracic aorta, with the  ?ascending aorta measuring up to 5 cm in diameter. Ascending thoracic  ?aortic aneurysm. Recommend semi-annual imaging followup by CTA or  ?MRA and referral to cardiothoracic surgery if not already obtained.  ?This recommendation follows 2010  ?ACCF/AHA/AATS/ACR/ASA/SCA/SCAI/SIR/STS/SVM Guidelines for the  ?Diagnosis and Management of Patients With Thoracic Aortic Disease.  ?Circulation. 2010; 121: [id number]. Aortic aneurysm NOS (ICD10-I71.9)  ? ?

## 2021-11-28 ENCOUNTER — Other Ambulatory Visit: Payer: Self-pay

## 2021-11-28 DIAGNOSIS — E1169 Type 2 diabetes mellitus with other specified complication: Secondary | ICD-10-CM | POA: Insufficient documentation

## 2021-11-28 DIAGNOSIS — Z789 Other specified health status: Secondary | ICD-10-CM | POA: Insufficient documentation

## 2021-11-28 DIAGNOSIS — E7800 Pure hypercholesterolemia, unspecified: Secondary | ICD-10-CM | POA: Insufficient documentation

## 2021-11-28 DIAGNOSIS — I10 Essential (primary) hypertension: Secondary | ICD-10-CM | POA: Insufficient documentation

## 2021-12-05 DIAGNOSIS — I77810 Thoracic aortic ectasia: Secondary | ICD-10-CM | POA: Diagnosis not present

## 2021-12-05 DIAGNOSIS — S42002D Fracture of unspecified part of left clavicle, subsequent encounter for fracture with routine healing: Secondary | ICD-10-CM | POA: Diagnosis not present

## 2021-12-05 DIAGNOSIS — E119 Type 2 diabetes mellitus without complications: Secondary | ICD-10-CM | POA: Diagnosis not present

## 2021-12-05 DIAGNOSIS — I10 Essential (primary) hypertension: Secondary | ICD-10-CM | POA: Diagnosis not present

## 2021-12-05 DIAGNOSIS — R600 Localized edema: Secondary | ICD-10-CM | POA: Diagnosis not present

## 2021-12-05 DIAGNOSIS — R001 Bradycardia, unspecified: Secondary | ICD-10-CM | POA: Diagnosis not present

## 2021-12-05 DIAGNOSIS — I700 Atherosclerosis of aorta: Secondary | ICD-10-CM | POA: Diagnosis not present

## 2021-12-16 ENCOUNTER — Institutional Professional Consult (permissible substitution) (INDEPENDENT_AMBULATORY_CARE_PROVIDER_SITE_OTHER): Payer: Medicare Other | Admitting: Physician Assistant

## 2021-12-16 ENCOUNTER — Encounter: Payer: Self-pay | Admitting: Cardiothoracic Surgery

## 2021-12-16 VITALS — BP 166/85 | HR 56 | Resp 20 | Ht 64.0 in | Wt 164.8 lb

## 2021-12-16 DIAGNOSIS — I77810 Thoracic aortic ectasia: Secondary | ICD-10-CM | POA: Diagnosis not present

## 2021-12-16 NOTE — Patient Instructions (Signed)
Make every effort to maintain a "heart-healthy" lifestyle with regular physical exercise and adherence to a low-fat, low-carbohydrate diet.  Continue to seek regular follow-up appointments with your primary care physician and/or cardiologist. ? ?AVOID FLUOROQUINOLONES AS THESE CAN RAISE YOUR RISK OF AORTIC DISSECTION ?

## 2021-12-16 NOTE — Progress Notes (Signed)
? ?   ?[address] ?      [place] [address] ?            [phone]   ? ?  ? ? ?Juliann Pulse ?[id number] ?[birth year] ? ?History of Present Illness: ? ?Rachel Hines is an 88 yo female with history of HTN, HLD, Type 2 DM, and Ascending aortic aneurysm.  The patient recently suffered a fall and was found to have an aortic aneurysm during evaluation.  This was measured at 5 cm.  The patient is accompanied by her daughter.  Overall the patient states she is doing well.  She knows she has high blood pressure and states they are currently working on getting it under better control.  She was recently started on Spironolactone by Dr. Tamala Julian and she follows up with him next week.  She denies chest pain, palpitations, and shortness of breath.  She has developed new lower extremity swelling.  She has tried TED hose which cause the back of her knee to swell.  She is a non-smoker.  She is not currently on a statin because she is intolerant.  ? ?Current Outpatient Medications on File Prior to Visit  ?Medication Sig Dispense Refill  ? atenolol (TENORMIN) 100 MG tablet Take 100 mg by mouth every morning.    ? metFORMIN (GLUCOPHAGE-XR) 500 MG 24 hr tablet Take 500 mg by mouth daily with breakfast.    ? olmesartan (BENICAR) 40 MG tablet Take 40 mg by mouth daily.    ? oxyCODONE-acetaminophen (PERCOCET/ROXICET) 5-325 MG tablet Take 1 tablet by mouth every 6 (six) hours.    ? spironolactone (ALDACTONE) 25 MG tablet Take 0.5 tablets by mouth daily.    ? zolpidem (AMBIEN) 10 MG tablet Take 10 mg by mouth at bedtime as needed for sleep.    ? ?No current facility-administered medications on file prior to visit.  ? ?BP (!) 166/85 (BP Location: Left Arm, Patient Position: Sitting, Cuff Size: Large)   Pulse (!) 56   Resp 20   Ht '5\' 4"'$  (1.626 m)   Wt 164 lb 12.8 oz (74.8 kg)   SpO2 95% Comment: RA  BMI 28.29 kg/m?  ? ?Physical Exam ? ?Gen: NAD ?Neck: no carotid bruit ?Heart: RRR ?Lungs: CTA bilaterally ?Abd: soft non  tender non-distended ?Ext: mild edema bilaterally ?Neuro: grossly intact ? ?CTA Results: ? ?CLINICAL DATA:  Initial evaluation for acute trauma, fall. ?  ?EXAM: ?CT CHEST WITH CONTRAST ?  ?TECHNIQUE: ?Multidetector CT imaging of the chest was performed during ?intravenous contrast administration. ?  ?RADIATION DOSE REDUCTION: This exam was performed according to the ?departmental dose-optimization program which includes automated ?exposure control, adjustment of the mA and/or kV according to ?patient size and/or use of iterative reconstruction technique. ?  ?CONTRAST:  32m OMNIPAQUE IOHEXOL 300 MG/ML  SOLN ?  ?COMPARISON:  None available. ?  ?FINDINGS: ?Cardiovascular: Diffuse aneurysmal dilatation of the intrathoracic ?aorta is seen. Ascending aorta measures up to 5 cm in diameter. ?Aortic arch dilated up to 3.5 cm. Descending intrathoracic aorta ?dilated up to 4.5 cm. No acute aortic pathology. Visualized great ?vessels intact. Cardiomegaly noted. Trace pericardial effusion. ?Limited assessment of the pulmonary arterial tree grossly ?unremarkable. ?  ?Mediastinum/Nodes: Thyroid within normal limits. No enlarged ?mediastinal, hilar, or axillary lymph nodes. Esophagus within normal ?limits. Small hiatal hernia noted. ?  ?Lungs/Pleura: Tracheobronchial tree intact and patent. Lungs ?normally inflated. Scattered subsegmental atelectatic changes noted ?within the right middle lobe, lingula, and lower lobes bilaterally. ?  No focal infiltrates or consolidative airspace disease. No pulmonary ?edema or pleural effusion. No pneumothorax. No worrisome pulmonary ?nodule or mass. ?  ?Upper Abdomen: Visualized upper abdomen demonstrates no acute ?finding. ?  ?Musculoskeletal: Comminuted fracture involving the distal third of ?the left clavicle. Associated soft tissue stranding within the ?adjacent left supraclavicular region. No frank active contrast ?extravasation. No other acute osseous abnormality. Visualized ribs ?are  intact. Exaggeration of the normal thoracic kyphosis noted. No ?discrete or worrisome osseous lesions. ?  ?IMPRESSION: ?1. Comminuted fracture involving the distal third of the left ?clavicle. Associated soft tissue stranding within the adjacent left ?supraclavicular region without active contrast extravasation. ?2. No other acute traumatic injury or other abnormality within the ?thorax. ?3. Cardiomegaly with trace pericardial effusion. ?4. Aneurysmal dilatation of the intrathoracic aorta, with the ?ascending aorta measuring up to 5 cm in diameter. Ascending thoracic ?aortic aneurysm. Recommend semi-annual imaging followup by CTA or ?MRA and referral to cardiothoracic surgery if not already obtained. ?This recommendation follows 2010 ?ACCF/AHA/AATS/ACR/ASA/SCA/SCAI/SIR/STS/SVM Guidelines for the ?Diagnosis and Management of Patients With Thoracic Aortic Disease. ?Circulation. 2010; 121: O709-G283. Aortic aneurysm NOS (ICD10-I71.9) ?   ?Electronically Signed ?  By: Jeannine Boga M.D. ?  On: 11/21/2021 03:42 ? ? ?A/P; ? ?Ascending Aortic Aneurysm- measuring 5 cm on CT scan, due to the patient's advanced age she would not be a surgical candidate for intervention.  She will require repeat CT scan in 6 months ?HTN- patients blood pressure is improved, she states its running in the 130s at home.  She was recently started on Spironolactone by Dr. Tamala Julian at 12.5 mg daily.  She will follow up with him next week where I suspect this will be increased to 25 mg daily +/- addition of additional agent, to get BP to 120 or less ?HLD- she is statin intolerant ?Lower Extremity- TED caused swelling in back of knee, encouraged patient to try socks, elevate legs at rest ?RTC in 6 months with repeat CT chest ? ? ?Risk Modification: ? ?Statin:  Intolerant ? ?Smoking cessation instruction/counseling given:   ?Never Smoker ? ?Patient was counseled on importance of Blood Pressure Control.  Despite Medical intervention if the patient  notices persistently elevated blood pressure readings.  They are instructed to contact their Primary Care Physician ? ?Please avoid use of Fluoroquinolones as this can potentially increase your risk of Aortic Rupture and/or Dissection ? ?Patient educated on signs and symptoms of Aortic Dissection, handout also provided in AVS ? ?Ellwood Handler, PA-C ?12/16/21 ? ? ? ? ?

## 2021-12-25 DIAGNOSIS — I10 Essential (primary) hypertension: Secondary | ICD-10-CM | POA: Diagnosis not present

## 2021-12-25 DIAGNOSIS — I77810 Thoracic aortic ectasia: Secondary | ICD-10-CM | POA: Diagnosis not present

## 2022-01-14 DIAGNOSIS — I10 Essential (primary) hypertension: Secondary | ICD-10-CM | POA: Diagnosis not present

## 2022-03-06 DIAGNOSIS — R1319 Other dysphagia: Secondary | ICD-10-CM | POA: Diagnosis not present

## 2022-03-06 DIAGNOSIS — Z0000 Encounter for general adult medical examination without abnormal findings: Secondary | ICD-10-CM | POA: Diagnosis not present

## 2022-03-06 DIAGNOSIS — E7800 Pure hypercholesterolemia, unspecified: Secondary | ICD-10-CM | POA: Diagnosis not present

## 2022-03-06 DIAGNOSIS — E1169 Type 2 diabetes mellitus with other specified complication: Secondary | ICD-10-CM | POA: Diagnosis not present

## 2022-03-06 DIAGNOSIS — I77810 Thoracic aortic ectasia: Secondary | ICD-10-CM | POA: Diagnosis not present

## 2022-03-06 DIAGNOSIS — I700 Atherosclerosis of aorta: Secondary | ICD-10-CM | POA: Diagnosis not present

## 2022-03-06 DIAGNOSIS — Z1331 Encounter for screening for depression: Secondary | ICD-10-CM | POA: Diagnosis not present

## 2022-03-06 DIAGNOSIS — I10 Essential (primary) hypertension: Secondary | ICD-10-CM | POA: Diagnosis not present

## 2022-04-25 ENCOUNTER — Observation Stay (HOSPITAL_BASED_OUTPATIENT_CLINIC_OR_DEPARTMENT_OTHER)
Admission: EM | Admit: 2022-04-25 | Discharge: 2022-04-26 | Disposition: A | Discharge status: Home Health | Payer: Medicare Other | Attending: Internal Medicine | Admitting: Internal Medicine

## 2022-04-25 ENCOUNTER — Encounter (HOSPITAL_COMMUNITY): Payer: Self-pay

## 2022-04-25 ENCOUNTER — Emergency Department (HOSPITAL_BASED_OUTPATIENT_CLINIC_OR_DEPARTMENT_OTHER): Payer: Medicare Other

## 2022-04-25 ENCOUNTER — Other Ambulatory Visit: Payer: Self-pay

## 2022-04-25 ENCOUNTER — Observation Stay (HOSPITAL_COMMUNITY): Payer: Medicare Other

## 2022-04-25 ENCOUNTER — Encounter (HOSPITAL_BASED_OUTPATIENT_CLINIC_OR_DEPARTMENT_OTHER): Payer: Self-pay | Admitting: Pharmacy Technician

## 2022-04-25 DIAGNOSIS — R410 Disorientation, unspecified: Secondary | ICD-10-CM | POA: Diagnosis present

## 2022-04-25 DIAGNOSIS — I63532 Cerebral infarction due to unspecified occlusion or stenosis of left posterior cerebral artery: Secondary | ICD-10-CM | POA: Diagnosis not present

## 2022-04-25 DIAGNOSIS — I6529 Occlusion and stenosis of unspecified carotid artery: Secondary | ICD-10-CM | POA: Diagnosis not present

## 2022-04-25 DIAGNOSIS — Z79899 Other long term (current) drug therapy: Secondary | ICD-10-CM | POA: Insufficient documentation

## 2022-04-25 DIAGNOSIS — E1122 Type 2 diabetes mellitus with diabetic chronic kidney disease: Secondary | ICD-10-CM | POA: Diagnosis not present

## 2022-04-25 DIAGNOSIS — I10 Essential (primary) hypertension: Secondary | ICD-10-CM | POA: Diagnosis not present

## 2022-04-25 DIAGNOSIS — I639 Cerebral infarction, unspecified: Secondary | ICD-10-CM | POA: Diagnosis not present

## 2022-04-25 DIAGNOSIS — H547 Unspecified visual loss: Secondary | ICD-10-CM

## 2022-04-25 DIAGNOSIS — I129 Hypertensive chronic kidney disease with stage 1 through stage 4 chronic kidney disease, or unspecified chronic kidney disease: Secondary | ICD-10-CM | POA: Insufficient documentation

## 2022-04-25 DIAGNOSIS — I77810 Thoracic aortic ectasia: Secondary | ICD-10-CM | POA: Diagnosis not present

## 2022-04-25 DIAGNOSIS — I63432 Cerebral infarction due to embolism of left posterior cerebral artery: Secondary | ICD-10-CM

## 2022-04-25 DIAGNOSIS — Z96651 Presence of right artificial knee joint: Secondary | ICD-10-CM | POA: Diagnosis not present

## 2022-04-25 DIAGNOSIS — E1169 Type 2 diabetes mellitus with other specified complication: Secondary | ICD-10-CM | POA: Diagnosis present

## 2022-04-25 DIAGNOSIS — Z7984 Long term (current) use of oral hypoglycemic drugs: Secondary | ICD-10-CM | POA: Diagnosis not present

## 2022-04-25 DIAGNOSIS — N1831 Chronic kidney disease, stage 3a: Secondary | ICD-10-CM | POA: Diagnosis present

## 2022-04-25 LAB — DIFFERENTIAL
Abs Immature Granulocytes: 0.01 10*3/uL (ref 0.00–0.07)
Basophils Absolute: 0 10*3/uL (ref 0.0–0.1)
Basophils Relative: 0 %
Eosinophils Absolute: 0.1 10*3/uL (ref 0.0–0.5)
Eosinophils Relative: 1 %
Immature Granulocytes: 0 %
Lymphocytes Relative: 28 %
Lymphs Abs: 1.9 10*3/uL (ref 0.7–4.0)
Monocytes Absolute: 0.6 10*3/uL (ref 0.1–1.0)
Monocytes Relative: 8 %
Neutro Abs: 4.2 10*3/uL (ref 1.7–7.7)
Neutrophils Relative %: 63 %

## 2022-04-25 LAB — COMPREHENSIVE METABOLIC PANEL
ALT: 9 U/L (ref 0–44)
AST: 13 U/L — ABNORMAL LOW (ref 15–41)
Albumin: 4 g/dL (ref 3.5–5.0)
Alkaline Phosphatase: 84 U/L (ref 38–126)
Anion gap: 10 (ref 5–15)
BUN: 31 mg/dL — ABNORMAL HIGH (ref 8–23)
CO2: 24 mmol/L (ref 22–32)
Calcium: 9.6 mg/dL (ref 8.9–10.3)
Chloride: 104 mmol/L (ref 98–111)
Creatinine, Ser: 1.1 mg/dL — ABNORMAL HIGH (ref 0.44–1.00)
GFR, Estimated: 48 mL/min — ABNORMAL LOW (ref >60)
Glucose, Bld: 100 mg/dL — ABNORMAL HIGH (ref 70–99)
Potassium: 4.7 mmol/L (ref 3.5–5.1)
Sodium: 138 mmol/L (ref 135–145)
Total Bilirubin: 0.7 mg/dL (ref 0.3–1.2)
Total Protein: 7.2 g/dL (ref 6.5–8.1)

## 2022-04-25 LAB — URINALYSIS, ROUTINE W REFLEX MICROSCOPIC
Bilirubin Urine: NEGATIVE
Glucose, UA: NEGATIVE mg/dL
Hgb urine dipstick: NEGATIVE
Ketones, ur: NEGATIVE mg/dL
Nitrite: NEGATIVE
Protein, ur: NEGATIVE mg/dL
Specific Gravity, Urine: 1.015 (ref 1.005–1.030)
pH: 6 (ref 5.0–8.0)

## 2022-04-25 LAB — CBC
HCT: 39.5 % (ref 36.0–46.0)
Hemoglobin: 13.3 g/dL (ref 12.0–15.0)
MCH: 30.9 pg (ref 26.0–34.0)
MCHC: 33.7 g/dL (ref 30.0–36.0)
MCV: 91.6 fL (ref 80.0–100.0)
Platelets: 202 10*3/uL (ref 150–400)
RBC: 4.31 MIL/uL (ref 3.87–5.11)
RDW: 12.4 % (ref 11.5–15.5)
WBC: 6.8 10*3/uL (ref 4.0–10.5)
nRBC: 0 % (ref 0.0–0.2)

## 2022-04-25 LAB — GLUCOSE, CAPILLARY: Glucose-Capillary: 127 mg/dL — ABNORMAL HIGH (ref 70–99)

## 2022-04-25 LAB — PROTIME-INR
INR: 1.1 (ref 0.8–1.2)
Prothrombin Time: 14.1 seconds (ref 11.4–15.2)

## 2022-04-25 LAB — CBG MONITORING, ED: Glucose-Capillary: 90 mg/dL (ref 70–99)

## 2022-04-25 LAB — APTT: aPTT: 32 seconds (ref 24–36)

## 2022-04-25 MED ORDER — SPIRONOLACTONE 12.5 MG HALF TABLET
12.5000 mg | ORAL_TABLET | Freq: Every day | ORAL | Status: DC
  Administered 2022-04-26: 12.5 mg via ORAL
  Filled 2022-04-25: qty 1

## 2022-04-25 MED ORDER — ATENOLOL 25 MG PO TABS: 100.0000 mg | ORAL_TABLET | Freq: Every morning | ORAL | Status: DC

## 2022-04-25 MED ORDER — IOHEXOL 350 MG/ML SOLN
100.0000 mL | Freq: Once | INTRAVENOUS | Status: AC | PRN
  Administered 2022-04-25: 75 mL via INTRAVENOUS

## 2022-04-25 MED ORDER — ACETAMINOPHEN 160 MG/5ML PO SOLN: 650.0000 mg | ORAL | Status: DC | PRN

## 2022-04-25 MED ORDER — ZOLPIDEM TARTRATE 5 MG PO TABS: 10.0000 mg | ORAL_TABLET | Freq: Every day | ORAL | Status: DC

## 2022-04-25 MED ORDER — ATENOLOL 25 MG PO TABS
100.0000 mg | ORAL_TABLET | Freq: Every morning | ORAL | Status: DC
  Administered 2022-04-26: 100 mg via ORAL
  Filled 2022-04-25: qty 4

## 2022-04-25 MED ORDER — SODIUM CHLORIDE 0.9% FLUSH
3.0000 mL | Freq: Once | INTRAVENOUS | Status: DC
  Filled 2022-04-25: qty 3

## 2022-04-25 MED ORDER — STROKE: EARLY STAGES OF RECOVERY BOOK
Freq: Once | Status: AC
Start: 2022-04-26 — End: 2022-04-26
  Filled 2022-04-25: qty 1

## 2022-04-25 MED ORDER — SENNOSIDES-DOCUSATE SODIUM 8.6-50 MG PO TABS: 1.0000 | ORAL_TABLET | Freq: Every evening | ORAL | Status: DC | PRN

## 2022-04-25 MED ORDER — ENOXAPARIN SODIUM 40 MG/0.4ML IJ SOSY
40.0000 mg | PREFILLED_SYRINGE | INTRAMUSCULAR | Status: DC
  Administered 2022-04-25: 40 mg via SUBCUTANEOUS
  Filled 2022-04-25: qty 0.4

## 2022-04-25 MED ORDER — METFORMIN HCL ER 500 MG PO TB24: 500.0000 mg | ORAL_TABLET | Freq: Every day | ORAL | Status: DC

## 2022-04-25 MED ORDER — INSULIN ASPART 100 UNIT/ML IJ SOLN
0.0000 [IU] | Freq: Three times a day (TID) | INTRAMUSCULAR | Status: DC
  Administered 2022-04-26: 1 [IU] via SUBCUTANEOUS

## 2022-04-25 MED ORDER — SPIRONOLACTONE 12.5 MG HALF TABLET
12.5000 mg | ORAL_TABLET | Freq: Every day | ORAL | Status: DC
Start: 2022-04-26 — End: 2022-04-25
  Filled 2022-04-25: qty 1

## 2022-04-25 MED ORDER — HYDRALAZINE HCL 25 MG PO TABS: 25.0000 mg | ORAL_TABLET | Freq: Four times a day (QID) | ORAL | Status: DC | PRN

## 2022-04-25 MED ORDER — IRBESARTAN 300 MG PO TABS: 300.0000 mg | ORAL_TABLET | Freq: Every day | ORAL | Status: DC

## 2022-04-25 MED ORDER — ZOLPIDEM TARTRATE 5 MG PO TABS
5.0000 mg | ORAL_TABLET | Freq: Every day | ORAL | Status: DC
  Administered 2022-04-25: 5 mg via ORAL
  Filled 2022-04-25: qty 1

## 2022-04-25 MED ORDER — ACETAMINOPHEN 650 MG RE SUPP: 650.0000 mg | RECTAL | Status: DC | PRN

## 2022-04-25 MED ORDER — ACETAMINOPHEN 325 MG PO TABS: 650.0000 mg | ORAL_TABLET | ORAL | Status: DC | PRN

## 2022-04-25 MED ORDER — IRBESARTAN 300 MG PO TABS
300.0000 mg | ORAL_TABLET | Freq: Every day | ORAL | Status: DC
  Administered 2022-04-26: 300 mg via ORAL
  Filled 2022-04-25: qty 1

## 2022-04-25 MED ORDER — SODIUM CHLORIDE 0.9 % IV SOLN: INTRAVENOUS | Status: AC

## 2022-04-25 MED ORDER — ASPIRIN 325 MG PO TABS
325.0000 mg | ORAL_TABLET | Freq: Every day | ORAL | Status: DC
  Administered 2022-04-25: 325 mg via ORAL
  Filled 2022-04-25: qty 1

## 2022-04-25 MED ORDER — ASPIRIN 81 MG PO TBEC
81.0000 mg | DELAYED_RELEASE_TABLET | Freq: Every day | ORAL | Status: DC
  Administered 2022-04-26: 81 mg via ORAL
  Filled 2022-04-25: qty 1

## 2022-04-25 NOTE — H&P (Signed)
History and Physical    Rachel Hines GGY:[id number] DOB: 12-13-1930 DOA: 04/25/2022  PCP: Carol Ada, MD   Patient coming from: Home   Chief Complaint: Confusion, vision deficit, headache   HPI: Rachel Hines is a pleasant 88 y.o. female with medical history significant for hypertension, thoracic aortic aneurysm, type 2 diabetes mellitus, and hyperlipidemia with statin intolerance, now presenting to the emergency department with 2-3 days of confusion, impaired vision, and headache.  Patient is accompanied by her daughter who assists with the history.  The patient lives alone but across the street from her daughter who checks on her frequently.  She developed a mild headache near the left temple that has been intermittent over the last 2 to 3 days.  She also noticed difficulty seeing out of her right eye.  Daughter noticed that she was newly confused which has persisted, and prompted her to be evaluated at the ED today.  She denies any recent chest pain, palpitations, fevers, or chills.  [place] ED Course: Upon arrival to the ED, patient is found to be afebrile and saturating well on room air with stable blood pressure.  EKG features sinus rhythm with PAC, first-degree AV nodal block, and LVH.  Head CT is concerning for acute to subacute nonhemorrhagic left medial occipital lobe infarction.  CTA is negative for large vessel occlusion but redemonstrates her known thoracic aortic aneurysm.  Neurology was consulted by the ED physician and the patient was transferred to [facility] for stroke work-up.  Review of Systems:  All other systems reviewed and apart from HPI, are negative.  Past Medical History:  Diagnosis Date   Acute meniscal injury of left knee    Arthritis    GERD (gastroesophageal reflux disease)    H/O hiatal hernia    Heart block AV first degree    History of esophageal dilatation    Hyperlipidemia    Hypertension    Impaired hearing    Left knee  pain    severe   Type 2 diabetes mellitus ([place])    Wears hearing aid     Past Surgical History:  Procedure Laterality Date   ABDOMINAL HYSTERECTOMY  1960'S   CATARACT EXTRACTION W/ INTRAOCULAR LENS IMPLANT Right    CHONDROPLASTY  04/14/2013   Procedure:  ABRATION CHONDROPLASTY OF MEDIAL CHONDIAL;  Surgeon: Tobi Bastos, MD;  Location: [place];  Service: Orthopedics;;   KNEE ARTHROSCOPY WITH LATERAL MENISECTOMY Left 04/14/2013   Procedure: KNEE ARTHROSCOPY WITH LATERAL MENISECTOMY;  Surgeon: Tobi Bastos, MD;  Location: [place];  Service: Orthopedics;  Laterality: Left;   KNEE ARTHROSCOPY WITH MEDIAL MENISECTOMY Left 04/14/2013   Procedure: LEFT KNEE ARTHROSCOPY WITH MEDIAL MENISECTOMY;  Surgeon: Tobi Bastos, MD;  Location: [place];  Service: Orthopedics;  Laterality: Left;   TOTAL KNEE ARTHROPLASTY Right 2011    Social History:   reports that she has never smoked. She has never used smokeless tobacco. She reports that she does not drink alcohol and does not use drugs.  Allergies  Allergen Reactions   Atorvastatin Other (See Comments)    Other reaction(s): severe muscle pain, bone pain   Other Rash and Other (See Comments)    Food dyes- Rashes and joint pain    History reviewed. No pertinent family history.   Prior to Admission medications   Medication Sig Start Date End Date Taking? Authorizing Provider  atenolol (TENORMIN) 100 MG tablet Take 100 mg by  mouth every morning.    [provider]  metFORMIN (GLUCOPHAGE-XR) 500 MG 24 hr tablet Take 500 mg by mouth daily with breakfast.    [provider]  olmesartan (BENICAR) 40 MG tablet Take 40 mg by mouth daily. 11/27/21   [provider]  oxyCODONE-acetaminophen (PERCOCET/ROXICET) 5-325 MG tablet Take 1 tablet by mouth every 6 (six) hours. 11/25/21   [provider]  spironolactone (ALDACTONE) 25 MG tablet Take 0.5 tablets by  mouth daily.    [provider]  zolpidem (AMBIEN) 10 MG tablet Take 10 mg by mouth at bedtime as needed for sleep.    [provider]    Physical Exam: Vitals:   04/25/22 1549 04/25/22 1600 04/25/22 1746 04/25/22 1940  BP: (!) 153/72 (!) 157/69  (!) 156/74  Pulse: (!) 51 (!) 50  (!) 54  Resp: '17 18  20  '$ Temp:   98.1 F (36.7 C) 98.2 F (36.8 C)  TempSrc:   Oral Oral  SpO2: 99% 98%  98%    Constitutional: NAD, calm  Eyes: PERTLA, lids and conjunctivae normal ENMT: Mucous membranes are moist. Posterior pharynx clear of any exudate or lesions.   Neck: supple, no masses  Respiratory: no wheezing, no crackles. No accessory muscle use.  Cardiovascular: S1 & S2 heard, regular rate and rhythm. Trace b/l lower extremity edema.   Abdomen: No distension, no tenderness, soft. Bowel sounds active.  Musculoskeletal: no clubbing / cyanosis. No joint deformity upper and lower extremities.   Skin: no significant rashes, lesions, ulcers. Warm, dry, well-perfused. Neurologic: Gross visual acuity deficit involving right eye, CNs otherwise intact . Sensation intact. Strength 5/5 in all 4 limbs. Alert and oriented.  Psychiatric: Very pleasant. Cooperative.    Labs and Imaging on Admission: I have personally reviewed following labs and imaging studies  CBC: Recent Labs  Lab 04/25/22 1119  WBC 6.8  NEUTROABS 4.2  HGB 13.3  HCT 39.5  MCV 91.6  PLT 253   Basic Metabolic Panel: Recent Labs  Lab 04/25/22 1119  NA 138  K 4.7  CL 104  CO2 24  GLUCOSE 100*  BUN 31*  CREATININE 1.10*  CALCIUM 9.6   GFR: CrCl cannot be calculated (Unknown ideal weight.). Liver Function Tests: Recent Labs  Lab 04/25/22 1119  AST 13*  ALT 9  ALKPHOS 84  BILITOT 0.7  PROT 7.2  ALBUMIN 4.0   No results for input(s): "LIPASE", "AMYLASE" in the last 168 hours. No results for input(s): "AMMONIA" in the last 168 hours. Coagulation Profile: Recent Labs  Lab 04/25/22 1119  INR 1.1    Cardiac Enzymes: No results for input(s): "CKTOTAL", "CKMB", "CKMBINDEX", "TROPONINI" in the last 168 hours. BNP (last 3 results) No results for input(s): "PROBNP" in the last 8760 hours. HbA1C: No results for input(s): "HGBA1C" in the last 72 hours. CBG: Recent Labs  Lab 04/25/22 1553  GLUCAP 90   Lipid Profile: No results for input(s): "CHOL", "HDL", "LDLCALC", "TRIG", "CHOLHDL", "LDLDIRECT" in the last 72 hours. Thyroid Function Tests: No results for input(s): "TSH", "T4TOTAL", "FREET4", "T3FREE", "THYROIDAB" in the last 72 hours. Anemia Panel: No results for input(s): "VITAMINB12", "FOLATE", "FERRITIN", "TIBC", "IRON", "RETICCTPCT" in the last 72 hours. Urine analysis:    Component Value Date/Time   COLORURINE COLORLESS (A) 04/25/2022 1316   APPEARANCEUR CLEAR 04/25/2022 1316   LABSPEC 1.015 04/25/2022 1316   PHURINE 6.0 04/25/2022 1316   GLUCOSEU NEGATIVE 04/25/2022 1316   HGBUR NEGATIVE 04/25/2022 1316   BILIRUBINUR NEGATIVE  04/25/2022 1316   [place] 04/25/2022 1316   PROTEINUR NEGATIVE 04/25/2022 1316   NITRITE NEGATIVE 04/25/2022 1316   LEUKOCYTESUR SMALL (A) 04/25/2022 1316   Sepsis Labs: '@LABRCNTIP'$ (procalcitonin:4,lacticidven:4) )No results found for this or any previous visit (from the past 240 hour(s)).   Radiological Exams on Admission: CT ANGIO HEAD NECK W WO CM  Result Date: 04/25/2022 CLINICAL DATA:  Stroke/TIA, determine embolic source. EXAM: CT ANGIOGRAPHY HEAD AND NECK TECHNIQUE: Multidetector CT imaging of the head and neck was performed using the standard protocol during bolus administration of intravenous contrast. Multiplanar CT image reconstructions and MIPs were obtained to evaluate the vascular anatomy. Carotid stenosis measurements (when applicable) are obtained utilizing NASCET criteria, using the distal internal carotid diameter as the denominator. RADIATION DOSE REDUCTION: This exam was performed according to the departmental  dose-optimization program which includes automated exposure control, adjustment of the mA and/or kV according to patient size and/or use of iterative reconstruction technique. CONTRAST:  47m OMNIPAQUE IOHEXOL 350 MG/ML SOLN COMPARISON:  None Available. FINDINGS: CTA NECK FINDINGS Aortic arch: Aneurysmal dilation of the partially imaged ascending and descending aorta, measuring up to 4.3 cm in diameter at the imaged levels. Standard aortic branching. Atherosclerotic plaque within the visualized aortic arch and proximal major branch vessels of the neck. No hemodynamically significant innominate or proximal subclavian artery stenosis. Right carotid system: CCA and ICA patent within the neck without stenosis. Mild atherosclerotic plaque about the carotid bifurcation. Left carotid system: CCA and ICA patent within the neck without stenosis. Mild atherosclerotic plaque at the CCA origin and about the carotid bifurcation. Vertebral arteries: Vertebral arteries codominant and patent within the neck without stenosis or significant atherosclerotic disease. 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 Cervical spondylosis. Chronic fracture deformity of the manubrium. No acute fracture or aggressive osseous lesion. Other neck: No neck mass or cervical lymphadenopathy. Upper chest: No consolidation within the imaged lung apices. Review of the MIP images confirms the above findings CTA HEAD FINDINGS Anterior circulation: The intracranial internal carotid arteries are patent. Atherosclerotic plaque within both vessels with no more than mild stenosis. The M1 middle cerebral arteries are patent. No M2 proximal branch occlusion or high-grade proximal stenosis. The anterior cerebral arteries are patent. No intracranial aneurysm is identified. Posterior circulation: The intracranial vertebral arteries are patent. The basilar artery is patent. Atherosclerotic irregularity of the posterior cerebral  arteries bilaterally without proximal occlusion or proximal high-grade stenosis. A right posterior communicating artery is present. The left posterior communicating artery is diminutive or absent. Venous sinuses: Within the limitations of contrast timing, no convincing thrombus. Anatomic variants: As described. Review of the MIP images confirms the above findings CTA neck impression #2 will be called to the ordering clinician or representative by the Radiologist Assistant, and communication documented in the PACS or [employer] IMPRESSION: CTA neck: 1. The common carotid, internal carotid and vertebral arteries are patent within the neck without stenosis. Mild atherosclerotic plaque within the carotid systems within the neck, as described. 2. Aneurysmal dilation of the partially imaged ascending and descending thoracic aorta (measuring up to 4.3 cm in diameter at the imaged levels). A dedicated CT angiogram of the chest is recommended for further evaluation. 3.  Aortic Atherosclerosis (ICD10-I70.0). CTA head: 1. Atherosclerotic irregularity of the posterior cerebral arteries without proximal occlusion or proximal high-grade stenosis. 2. No intracranial large vessel occlusion or proximal high-grade arterial stenosis elsewhere. 3. Mild atherosclerotic plaque within the intracranial internal carotid arteries with no more than mild stenosis.  Electronically Signed   By: Kellie Simmering D.O.   On: 04/25/2022 12:37   CT HEAD WO CONTRAST  Result Date: 04/25/2022 CLINICAL DATA:  Loss of vision started 2 days ago. EXAM: CT HEAD WITHOUT CONTRAST TECHNIQUE: Contiguous axial images were obtained from the base of the skull through the vertex without intravenous contrast. RADIATION DOSE REDUCTION: This exam was performed according to the departmental dose-optimization program which includes automated exposure control, adjustment of the mA and/or kV according to patient size and/or use of iterative reconstruction technique.  COMPARISON:  11/21/2021 FINDINGS: Brain: Low attenuation involving the left medial occipital lobe concerning for acute versus subacute infarct. No evidence of hemorrhage, extra-axial collection, ventriculomegaly, or mass effect. Generalized cerebral atrophy. Periventricular white matter low attenuation likely secondary to microangiopathy. Vascular: Cerebrovascular atherosclerotic calcifications are noted. Skull: Negative for fracture or focal lesion. Sinuses/Orbits: Visualized portions of the orbits are unremarkable. Visualized portions of the paranasal sinuses are unremarkable. Visualized portions of the mastoid air cells are unremarkable. Other: None. IMPRESSION: 1. Acute-subacute nonhemorrhagic left medial occipital lobe infarct. Critical Value/emergent results were called by telephone at the time of interpretation on 04/25/2022 at 10:52 am to provider [facility] , who verbally acknowledged these results. Electronically Signed   By: Kathreen Devoid M.D.   On: 04/25/2022 10:53    EKG: Independently reviewed. SR, PAC, 1st deg AV block, LVH.   Assessment/Plan   1. Acute-subacute ischemic CVA  - Presents with 2-3 days of vision disturbance and confusion and is found on CT to have acute to subacute ischemic CVA involving left occipital lobe  - CTA head and neck negative for LVO or significant stenosis  - She passed swallow screen  - Appreciate neurology consultation, plan to continue cardiac monitoring, continue neuro checks, check MRI brain, check echocardiogram, check A1c and lipids, consult PT/OT/SLP, treat with ASA 325 now and then 81 mg qD  - She reports stain intolerance    2. Type II DM  - Check A1c, check CBGs and use SSI for now   3. Hypertension  - Outside window for permissive HTN  - Continue atenolol, ARB, Aldcactone     4. CKD 3a  - SCr is 1.10 on admission  - Renally-dose medications, monitor    5. Thoracic aortic aneurysm  - Continue BP-control, continue CT surgery follow-up     DVT prophylaxis: [place]  Code Status: Full  Level of Care: Level of care: Telemetry Medical Family Communication: Daughter at bedside  Disposition Plan:  Patient is from: Home  Anticipated d/c is to: TBD Anticipated d/c date is: 04/26/22  Patient currently: Pending CVA workup, therapy assessments Consults called: Neurology Admission status: Observation    Vianne Bulls, MD [facility]  04/25/2022, 8:19 PM

## 2022-04-25 NOTE — Progress Notes (Signed)
Received from [place] by ambulance.  Oriented to room and surroundings.  Received book detailing rights, responsibilities, and visitor guidelines.

## 2022-04-25 NOTE — Consult Note (Signed)
Neurology Consultation  Reason for Consult: Stroke Referring Physician: Dr. Langston Masker  CC: Confusion, vision loss  History is obtained from: Patient, chart  HPI: Rachel Hines is a 88 y.o. female past medical history of hypertension, hyperlipidemia, type 2 diabetes, hearing loss, first-degree AV block, presented to the emergency room at dro[place] with 2 days complaint of confusion and vision changes.  Per chart review, the daughter accompanied her to the ER and reported that the patient started complaining of bad headache on the left side and some changes in her vision now at least 2 to 3 days ago.  She is outside the window for any intervention.  Head CT was done that showed a subacute left PCA infarct.  Transferred to [facility] for higher level of care. Patient complains of a headache that was on the left head and temple that started 2 to 3 days ago.  After that she started having trouble, she says with the right eye looking at objects towards the right and felt that she could not see part of the picture or television that she was looking at.  She also appears a little confused.  She said that she was having trouble with seeing and finding things which added to her confusion.  She did not report any word finding difficulty but reports that she is not able to remember things well.  She lives independently and has no prior known dementia.   LKW: Unclear-2 to 3 days ago IV thrombolysis given?: no, outside the window Premorbid modified Rankin scale (mRS): Presumably 0  ROS: Full ROS was performed and is negative except as noted in the HPI.   Past Medical History:  Diagnosis Date   Acute meniscal injury of left knee    Arthritis    GERD (gastroesophageal reflux disease)    H/O hiatal hernia    Heart block AV first degree    History of esophageal dilatation    Hyperlipidemia    Hypertension    Impaired hearing    Left knee pain    severe   Type 2 diabetes mellitus ([place])    Wears  hearing aid      No family history on file.   Social History:   reports that she has never smoked. She has never used smokeless tobacco. She reports that she does not drink alcohol and does not use drugs.  Medications  Current Facility-Administered Medications:    aspirin tablet 325 mg, 325 mg, Oral, Daily, Langston Masker, Carola Rhine, MD, 325 mg at 04/25/22 1316   sodium chloride flush (NS) 0.9 % injection 3 mL, 3 mL, Intravenous, Once, [place], Carola Rhine, MD   Exam: Current vital signs: BP (!) 157/69   Pulse (!) 50   Temp 98.1 F (36.7 C) (Oral)   Resp 18   SpO2 98%  Vital signs in last 24 hours: Temp:  [97.6 F (36.4 C)-98.1 F (36.7 C)] 98.1 F (36.7 C) (08/11 1746) Pulse Rate:  [50-55] 50 (08/11 1600) Resp:  [16-18] 18 (08/11 1600) BP: (141-183)/(68-72) 157/69 (08/11 1600) SpO2:  [97 %-99 %] 98 % (08/11 1600) General: Awake alert in no distress HEENT: Normocephalic atraumatic, palpable temporal pulses, no temporal tenderness. Lungs: Clear Cardiovascular: Regular rhythm Abdomen nondistended nontender Extremities warm well perfused Neurological exam Awake alert oriented x3 No dysarthria No aphasia Mildly diminished attention concentration 3/3 registration, 1/3 recall for 3 words. Cranial nerves: Pupils equal round react light, extraocular movements intact, visual fields-right homonymous hemianopsia, facial sensation intact, face symmetric,  hearing decreased bilaterally. Motor examination with no drift in any 4 extremities Sensation intact light touch Coordination exam with no dysmetria NIH stroke scale-2 for homonymous hemianopsia on the right  Labs I have reviewed labs in epic and the results pertinent to this consultation are: CBC    Component Value Date/Time   WBC 6.8 04/25/2022 1119   RBC 4.31 04/25/2022 1119   HGB 13.3 04/25/2022 1119   HCT 39.5 04/25/2022 1119   PLT 202 04/25/2022 1119   MCV 91.6 04/25/2022 1119   MCH 30.9 04/25/2022 1119   MCHC 33.7  04/25/2022 1119   RDW 12.4 04/25/2022 1119   LYMPHSABS 1.9 04/25/2022 1119   MONOABS 0.6 04/25/2022 1119   EOSABS 0.1 04/25/2022 1119   BASOSABS 0.0 04/25/2022 1119    CMP     Component Value Date/Time   NA 138 04/25/2022 1119   K 4.7 04/25/2022 1119   CL 104 04/25/2022 1119   CO2 24 04/25/2022 1119   GLUCOSE 100 (H) 04/25/2022 1119   BUN 31 (H) 04/25/2022 1119   CREATININE 1.10 (H) 04/25/2022 1119   CALCIUM 9.6 04/25/2022 1119   PROT 7.2 04/25/2022 1119   ALBUMIN 4.0 04/25/2022 1119   AST 13 (L) 04/25/2022 1119   ALT 9 04/25/2022 1119   ALKPHOS 84 04/25/2022 1119   BILITOT 0.7 04/25/2022 1119   GFRNONAA 48 (L) 04/25/2022 1119    Imaging I have reviewed the images obtained:  CT-head subacute left PCA stroke CT angiography head and neck with ascending and descending aortic aneurysm but no emergent LVO or proximal vessel stenosis in the head and neck.  Assessment:  88 year old with above past medical history presenting for 2 to 3 days worth of confusion and vision changes.  On exam has right homonymous hemianopsia.  CT head with subacute left PCA stroke. Etiology atheroembolic versus cardioembolic Needs further work-up.  Recommendations: -Admit to hospitalist -Telemetry monitoring -3 days-no need for permissive hypertension  -MRI brain without contrast -Echocardiogram -HgbA1c, fasting lipid panel -Frequent neuro checks -Prophylactic therapy-Antiplatelet med: Aspirin - dose '325mg'$  PO today followed by aspirin 81. -Atorvastatin 80 mg PO daily -PT consult, OT consult, Speech consult -May need long-term cardiac monitoring as an outpatient-we will defer to telemetry results and stroke team rounding tomorrow.  Plan relayed to the admitting hospitalist Dr. Myna Hidalgo.  -- Amie Portland, MD Neurologist [facility] Pager: [phone]

## 2022-04-25 NOTE — ED Triage Notes (Signed)
Pt here with R sided vision loss and L sided headache along with confusion. States vision is better now but still having some confusion.

## 2022-04-25 NOTE — ED Provider Notes (Signed)
[facility] EMERGENCY DEPT Provider Note   CSN: [id number] Arrival date & time: 04/25/22  1017     History  Chief Complaint  Patient presents with   Stroke Symptoms    Rachel Hines is a 88 y.o. female with history of diabetes, hypertension, presenting to the ED with concern for confusion and vision changes.  Supplement history provided by the patient's daughter at the bedside.  She reports that the patient called her complaining of a bad headache and bilateral vision loss 2 days ago.  The patient reported the headache seems to have improved, it is uncertain whether the vision has improved, as the patient is a fairly poor historian.  However her daughter is concerned the patient has been quite confused over the past 2 days, repeating herself, normally the patient is quite lucid, lives independently, and does not have issues with dementia according to her daughter.  I called her doctor's office advised that they come immediately to the ER for stroke workup  HPI     Home Medications Prior to Admission medications   Medication Sig Start Date End Date Taking? Authorizing Provider  atenolol (TENORMIN) 100 MG tablet Take 100 mg by mouth every morning.    [provider]  metFORMIN (GLUCOPHAGE-XR) 500 MG 24 hr tablet Take 500 mg by mouth daily with breakfast.    [provider]  olmesartan (BENICAR) 40 MG tablet Take 40 mg by mouth daily. 11/27/21   [provider]  oxyCODONE-acetaminophen (PERCOCET/ROXICET) 5-325 MG tablet Take 1 tablet by mouth every 6 (six) hours. 11/25/21   [provider]  spironolactone (ALDACTONE) 25 MG tablet Take 0.5 tablets by mouth daily.    [provider]  zolpidem (AMBIEN) 10 MG tablet Take 10 mg by mouth at bedtime as needed for sleep.    [provider]      Allergies    Atorvastatin    Review of Systems   Review of Systems  Physical Exam Updated Vital Signs BP (!) 141/68   Pulse (!)  51   Temp 97.6 F (36.4 C) (Temporal)   Resp 18   SpO2 97%  Physical Exam Constitutional:      General: She is not in acute distress. HENT:     Head: Normocephalic and atraumatic.  Eyes:     Conjunctiva/sclera: Conjunctivae normal.     Pupils: Pupils are equal, round, and reactive to light.  Cardiovascular:     Rate and Rhythm: Normal rate and regular rhythm.  Pulmonary:     Effort: Pulmonary effort is normal. No respiratory distress.  Skin:    General: Skin is warm and dry.  Neurological:     Mental Status: She is alert. Mental status is at baseline.     Comments: No gross visual field deficits, however patient is having difficulty following with cranial nerve exam, reports, dark spots in middle of vision No pronator drift, no slurred speech, no other cranial nerve deficits noted Patient is ambulatory in the ED  Psychiatric:        Mood and Affect: Mood normal.        Behavior: Behavior normal.     ED Results / Procedures / Treatments   Labs (all labs ordered are listed, but only abnormal results are displayed) Labs Reviewed  COMPREHENSIVE METABOLIC PANEL - Abnormal; Notable for the following components:      Result Value   Glucose, Bld 100 (*)    BUN 31 (*)    Creatinine, Ser  1.10 (*)    AST 13 (*)    GFR, Estimated 48 (*)    All other components within normal limits  PROTIME-INR  APTT  CBC  DIFFERENTIAL  ETHANOL  URINALYSIS, ROUTINE W REFLEX MICROSCOPIC  CBG MONITORING, ED    EKG EKG Interpretation  Date/Time:  Friday April 25 2022 10:49:37 EDT Ventricular Rate:  52 PR Interval:  253 QRS Duration: 92 QT Interval:  442 QTC Calculation: 411 R Axis:   -17 Text Interpretation: Sinus rhythm Atrial premature complex Prolonged PR interval Abnormal R-wave progression, early transition LVH by voltage Confirmed by Octaviano Glow [phone]) on 04/25/2022 11:07:15 AM  Radiology CT ANGIO HEAD NECK W WO CM  Result Date: 04/25/2022 CLINICAL DATA:  Stroke/TIA,  determine embolic source. EXAM: CT ANGIOGRAPHY HEAD AND NECK TECHNIQUE: Multidetector CT imaging of the head and neck was performed using the standard protocol during bolus administration of intravenous contrast. Multiplanar CT image reconstructions and MIPs were obtained to evaluate the vascular anatomy. Carotid stenosis measurements (when applicable) are obtained utilizing NASCET criteria, using the distal internal carotid diameter as the denominator. RADIATION DOSE REDUCTION: This exam was performed according to the departmental dose-optimization program which includes automated exposure control, adjustment of the mA and/or kV according to patient size and/or use of iterative reconstruction technique. CONTRAST:  3m OMNIPAQUE IOHEXOL 350 MG/ML SOLN COMPARISON:  None Available. FINDINGS: CTA NECK FINDINGS Aortic arch: Aneurysmal dilation of the partially imaged ascending and descending aorta, measuring up to 4.3 cm in diameter at the imaged levels. Standard aortic branching. Atherosclerotic plaque within the visualized aortic arch and proximal major branch vessels of the neck. No hemodynamically significant innominate or proximal subclavian artery stenosis. Right carotid system: CCA and ICA patent within the neck without stenosis. Mild atherosclerotic plaque about the carotid bifurcation. Left carotid system: CCA and ICA patent within the neck without stenosis. Mild atherosclerotic plaque at the CCA origin and about the carotid bifurcation. Vertebral arteries: Vertebral arteries codominant and patent within the neck without stenosis or significant atherosclerotic disease. 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 Cervical spondylosis. Chronic fracture deformity of the manubrium. No acute fracture or aggressive osseous lesion. Other neck: No neck mass or cervical lymphadenopathy. Upper chest: No consolidation within the imaged lung apices. Review of the MIP images confirms the  above findings CTA HEAD FINDINGS Anterior circulation: The intracranial internal carotid arteries are patent. Atherosclerotic plaque within both vessels with no more than mild stenosis. The M1 middle cerebral arteries are patent. No M2 proximal branch occlusion or high-grade proximal stenosis. The anterior cerebral arteries are patent. No intracranial aneurysm is identified. Posterior circulation: The intracranial vertebral arteries are patent. The basilar artery is patent. Atherosclerotic irregularity of the posterior cerebral arteries bilaterally without proximal occlusion or proximal high-grade stenosis. A right posterior communicating artery is present. The left posterior communicating artery is diminutive or absent. Venous sinuses: Within the limitations of contrast timing, no convincing thrombus. Anatomic variants: As described. Review of the MIP images confirms the above findings CTA neck impression #2 will be called to the ordering clinician or representative by the Radiologist Assistant, and communication documented in the PACS or [employer] IMPRESSION: CTA neck: 1. The common carotid, internal carotid and vertebral arteries are patent within the neck without stenosis. Mild atherosclerotic plaque within the carotid systems within the neck, as described. 2. Aneurysmal dilation of the partially imaged ascending and descending thoracic aorta (measuring up to 4.3 cm in diameter at the imaged levels). A dedicated CT  angiogram of the chest is recommended for further evaluation. 3.  Aortic Atherosclerosis (ICD10-I70.0). CTA head: 1. Atherosclerotic irregularity of the posterior cerebral arteries without proximal occlusion or proximal high-grade stenosis. 2. No intracranial large vessel occlusion or proximal high-grade arterial stenosis elsewhere. 3. Mild atherosclerotic plaque within the intracranial internal carotid arteries with no more than mild stenosis. Electronically Signed   By: Kellie Simmering D.O.    On: 04/25/2022 12:37   CT HEAD WO CONTRAST  Result Date: 04/25/2022 CLINICAL DATA:  Loss of vision started 2 days ago. EXAM: CT HEAD WITHOUT CONTRAST TECHNIQUE: Contiguous axial images were obtained from the base of the skull through the vertex without intravenous contrast. RADIATION DOSE REDUCTION: This exam was performed according to the departmental dose-optimization program which includes automated exposure control, adjustment of the mA and/or kV according to patient size and/or use of iterative reconstruction technique. COMPARISON:  11/21/2021 FINDINGS: Brain: Low attenuation involving the left medial occipital lobe concerning for acute versus subacute infarct. No evidence of hemorrhage, extra-axial collection, ventriculomegaly, or mass effect. Generalized cerebral atrophy. Periventricular white matter low attenuation likely secondary to microangiopathy. Vascular: Cerebrovascular atherosclerotic calcifications are noted. Skull: Negative for fracture or focal lesion. Sinuses/Orbits: Visualized portions of the orbits are unremarkable. Visualized portions of the paranasal sinuses are unremarkable. Visualized portions of the mastoid air cells are unremarkable. Other: None. IMPRESSION: 1. Acute-subacute nonhemorrhagic left medial occipital lobe infarct. Critical Value/emergent results were called by telephone at the time of interpretation on 04/25/2022 at 10:52 am to provider [facility] , who verbally acknowledged these results. Electronically Signed   By: Kathreen Devoid M.D.   On: 04/25/2022 10:53    Procedures Procedures    Medications Ordered in ED Medications  sodium chloride flush (NS) 0.9 % injection 3 mL (has no administration in time range)  aspirin tablet 325 mg (has no administration in time range)  iohexol (OMNIPAQUE) 350 MG/ML injection 100 mL (75 mLs Intravenous Contrast Given 04/25/22 1156)    ED Course/ Medical Decision Making/ A&P Clinical Course as of 04/25/22 1302  Fri Apr 25, 2022  1106 Concern for occipital stroke on CT scan.  Patient and daughter updated.  Will discuss with teleneurologist, anticipate admission afterwards. [MT]  1111 Spoke to Dr Leonel Ramsay Neurology who advises medical admisison for stroke workp [MT]  1230 Admitted to dr zhang hospitalist [MT]  1247 Patient has a known ascending thoracic aortic aneurysm, noted incidentally on CTA [MT]    Clinical Course User Index [MT] Langston Masker Carola Rhine, MD                           Medical Decision Making Amount and/or Complexity of Data Reviewed Labs: ordered. Radiology: ordered.  Risk OTC drugs. Prescription drug management. Decision regarding hospitalization.   This patient presents to the ED with concern for headache, visual changes, confusion for 2 days. This involves an extensive number of treatment options, and is a complaint that carries with it a high risk of complications and morbidity.  The differential diagnosis includes CVA which would be highest on the differential versus UTI or other infection versus encephalitis  Co-morbidities that complicate the patient evaluation: Stroke risk factors include high blood pressure, diabetes  Patient is now presenting after 2 days of her symptoms, and is outside the window for TNK or thrombolytic intervention, does not demonstrate hemineglect or LVO symptoms.  Additional history obtained from the patient's daughter at the bedside  I ordered and personally  interpreted labs.  The pertinent results include: No emergent findings  I ordered imaging studies including CT imaging of the head. I independently visualized and interpreted imaging which showed acute occipital infarct, CTA head and neck noting incidental ascending thoracic aneurysm, which is known from prior workup. I agree with the radiologist interpretation  The patient was maintained on a cardiac monitor.  I personally viewed and interpreted the cardiac monitored which showed an underlying  rhythm of: This rhythm  Per my interpretation the patient's ECG shows sinus rhythm  I ordered medication including aspirin 3 to 25 mg for acute stroke I have reviewed the patients home medicines and have made adjustments as needed  I requested consultation with the neurology,  and discussed lab and imaging findings as well as pertinent plan - they recommend: Giving aspirin and medical admission for stroke workup  After the interventions noted above, I reevaluated the patient and found that they have: stayed the same   Dispostion:  After consideration of the diagnostic results and the patients response to treatment, I feel that the patent would benefit from medical admission.         Final Clinical Impression(s) / ED Diagnoses Final diagnoses:  Occipital stroke ([place])  Vision loss  Confusion    Rx / DC Orders ED Discharge Orders     None         Srihith Aquilino, Carola Rhine, MD 04/25/22 1302

## 2022-04-26 ENCOUNTER — Observation Stay (HOSPITAL_BASED_OUTPATIENT_CLINIC_OR_DEPARTMENT_OTHER): Payer: Medicare Other

## 2022-04-26 ENCOUNTER — Observation Stay (HOSPITAL_COMMUNITY): Payer: Medicare Other

## 2022-04-26 DIAGNOSIS — I639 Cerebral infarction, unspecified: Secondary | ICD-10-CM

## 2022-04-26 DIAGNOSIS — I6389 Other cerebral infarction: Secondary | ICD-10-CM

## 2022-04-26 LAB — GLUCOSE, CAPILLARY
Glucose-Capillary: 80 mg/dL (ref 70–99)
Glucose-Capillary: 178 mg/dL — ABNORMAL HIGH (ref 70–99)

## 2022-04-26 LAB — COMPREHENSIVE METABOLIC PANEL
ALT: 10 U/L (ref 0–44)
AST: 14 U/L — ABNORMAL LOW (ref 15–41)
Albumin: 3.5 g/dL (ref 3.5–5.0)
Alkaline Phosphatase: 86 U/L (ref 38–126)
Anion gap: 7 (ref 5–15)
BUN: 27 mg/dL — ABNORMAL HIGH (ref 8–23)
CO2: 25 mmol/L (ref 22–32)
Calcium: 9.1 mg/dL (ref 8.9–10.3)
Chloride: 108 mmol/L (ref 98–111)
Creatinine, Ser: 1.14 mg/dL — ABNORMAL HIGH (ref 0.44–1.00)
GFR, Estimated: 46 mL/min — ABNORMAL LOW (ref >60)
Glucose, Bld: 85 mg/dL (ref 70–99)
Potassium: 4.9 mmol/L (ref 3.5–5.1)
Sodium: 140 mmol/L (ref 135–145)
Total Bilirubin: 0.8 mg/dL (ref 0.3–1.2)
Total Protein: 6.4 g/dL — ABNORMAL LOW (ref 6.5–8.1)

## 2022-04-26 LAB — LIPID PANEL
Cholesterol: 154 mg/dL (ref 0–200)
HDL: 33 mg/dL — ABNORMAL LOW (ref >40)
LDL Cholesterol: 103 mg/dL — ABNORMAL HIGH (ref 0–99)
Total CHOL/HDL Ratio: 4.7 RATIO
Triglycerides: 92 mg/dL (ref <150)
VLDL: 18 mg/dL (ref 0–40)

## 2022-04-26 LAB — ECHOCARDIOGRAM COMPLETE
Area-P 1/2: 2.56 cm2
Height: 63 in
P 1/2 time: 696 msec
S' Lateral: 2.8 cm
Weight: 2490.32 oz

## 2022-04-26 LAB — CBC
HCT: 38.1 % (ref 36.0–46.0)
Hemoglobin: 12.9 g/dL (ref 12.0–15.0)
MCH: 31.2 pg (ref 26.0–34.0)
MCHC: 33.9 g/dL (ref 30.0–36.0)
MCV: 92 fL (ref 80.0–100.0)
Platelets: 180 10*3/uL (ref 150–400)
RBC: 4.14 MIL/uL (ref 3.87–5.11)
RDW: 12.3 % (ref 11.5–15.5)
WBC: 7.5 10*3/uL (ref 4.0–10.5)
nRBC: 0 % (ref 0.0–0.2)

## 2022-04-26 LAB — HEMOGLOBIN A1C
Hgb A1c MFr Bld: 5.6 % (ref 4.8–5.6)
Mean Plasma Glucose: 114.02 mg/dL

## 2022-04-26 MED ORDER — CLOPIDOGREL BISULFATE 75 MG PO TABS: 75.0000 mg | ORAL_TABLET | Freq: Every day | ORAL | 0 refills | Status: DC

## 2022-04-26 MED ORDER — ASPIRIN 81 MG PO TBEC: 81.0000 mg | DELAYED_RELEASE_TABLET | Freq: Every day | ORAL | 12 refills | Status: DC

## 2022-04-26 MED ORDER — ROSUVASTATIN CALCIUM 5 MG PO TABS: 5.0000 mg | ORAL_TABLET | Freq: Every day | ORAL | Status: DC

## 2022-04-26 MED ORDER — SPIRONOLACTONE 25 MG PO TABS: 12.5000 mg | ORAL_TABLET | Freq: Every day | ORAL | 0 refills | Status: DC

## 2022-04-26 MED ORDER — CLOPIDOGREL BISULFATE 75 MG PO TABS
75.0000 mg | ORAL_TABLET | Freq: Every day | ORAL | Status: DC
  Administered 2022-04-26: 75 mg via ORAL
  Filled 2022-04-26: qty 1

## 2022-04-26 MED ORDER — ROSUVASTATIN CALCIUM 5 MG PO TABS: 5.0000 mg | ORAL_TABLET | Freq: Every day | ORAL | 1 refills | Status: DC

## 2022-04-26 MED ORDER — ORAL CARE MOUTH RINSE: 15.0000 mL | OROMUCOSAL | Status: DC | PRN

## 2022-04-26 NOTE — Progress Notes (Addendum)
STROKE TEAM PROGRESS NOTE   INTERVAL HISTORY Her daughter and son-in-law are at the bedside. Patient reports that a week ago she started to have a L sided headache and R sided vision changes. This lasted for around an hour. Family reports she called them at the time but she was unwilling to go see a doctor. They also noticed difficulty with left hand coordination and memory difficulties over the course of the week. She went to see her PCP who recommended further evaluation in the ED, so she came yesterday.   Today, patient is alert and oriented to self and place but not oriented to time. Most of the history is provided by family at bedside.  MRI scan of the brain shows a left medial temporoparietal infarct sparing the occipital cortex.  CT angiogram shows mild intracranial extracranial atherosclerotic changes without large vessel occlusion or stenosis.  Echocardiogram is pending Vitals:   04/26/22 0000 04/26/22 0418 04/26/22 0500 04/26/22 0804  BP:  (!) 146/62  (!) 164/76  Pulse:  (!) 54  (!) 56  Resp:    18  Temp:  98 F (36.7 C)  98.3 F (36.8 C)  TempSrc:  Oral    SpO2:  95%  97%  Weight: 69.3 kg  70.6 kg   Height: '5\' 3"'$  (1.6 m)      CBC:  Recent Labs  Lab 04/25/22 1119 04/26/22 0213  WBC 6.8 7.5  NEUTROABS 4.2  --   HGB 13.3 12.9  HCT 39.5 38.1  MCV 91.6 92.0  PLT 202 094   Basic Metabolic Panel:  Recent Labs  Lab 04/25/22 1119 04/26/22 0213  NA 138 140  K 4.7 4.9  CL 104 108  CO2 24 25  GLUCOSE 100* 85  BUN 31* 27*  CREATININE 1.10* 1.14*  CALCIUM 9.6 9.1   Lipid Panel:  Recent Labs  Lab 04/26/22 0213  CHOL 154  TRIG 92  HDL 33*  CHOLHDL 4.7  VLDL 18  LDLCALC 103*   HgbA1c:  Recent Labs  Lab 04/26/22 0213  HGBA1C 5.6   Urine Drug Screen: No results for input(s): "LABOPIA", "COCAINSCRNUR", "LABBENZ", "AMPHETMU", "THCU", "LABBARB" in the last 168 hours.  Alcohol Level No results for input(s): "ETH" in the last 168 hours.  IMAGING past 24 hours MR  BRAIN WO CONTRAST  Result Date: 04/26/2022 CLINICAL DATA:  Follow-up examination for acute stroke. EXAM: MRI HEAD WITHOUT CONTRAST TECHNIQUE: Multiplanar, multiecho pulse sequences of the brain and surrounding structures were obtained without intravenous contrast. COMPARISON:  Comparison made with prior CTs from 04/25/2022. FINDINGS: Brain: Cerebral volume within normal limits for age. Mild scattered patchy T2/FLAIR hyperintensity involving the periventricular and deep white matter both cerebral hemispheres, most consistent with chronic small vessel ischemic disease, minor for age. Confluent diffusion signal abnormality involving the mesial left temporal occipital region, consistent with an evolving early subacute left PCA distribution infarct. Involvement of the posterior left hippocampal formation noted. Localized gyral swelling without significant regional mass effect. Minimal associated petechial blood products without hemorrhagic transformation (series 14, image 23). No other evidence for acute or subacute ischemia. Gray-white matter differentiation otherwise maintained. No other areas of chronic cortical infarction. No other acute or chronic intracranial blood products. No mass lesion, midline shift or mass effect. No hydrocephalus or extra-axial fluid collection. Pituitary gland and suprasellar region within normal limits. Vascular: Major intracranial vascular flow voids are maintained. Skull and upper cervical spine: Craniocervical junction within normal limits. Bone marrow signal intensity normal. 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 No scalp soft tissue abnormality. Sinuses/Orbits: Prior bilateral ocular lens replacement. Paranasal sinuses are largely clear. No significant mastoid effusion. Other: None. IMPRESSION: 1. Evolving early subacute left PCA distribution infarct involving the mesial left temporoccipital region. Minimal associated petechial blood products without hemorrhagic  transformation. No significant regional mass effect. 2. No other acute intracranial abnormality. 3. Underlying mild for age chronic microvascular ischemic disease. Electronically Signed   By: Jeannine Boga M.D.   On: 04/26/2022 04:28    PHYSICAL EXAM  Physical Exam  Constitutional: Appears well-developed and well-nourished.  Pleasant elderly Caucasian lady not in distress. Cardiovascular: Normal rate and regular rhythm.  Respiratory: Effort normal, non-labored breathing  Neuro: Mental Status: Patient is awake, alert, oriented to person, place. Not oriented to month, year. No signs of aphasia or neglect. Short term memory not intact. Unable to recall any of the 3 words. Only able to name 4 animals.  Cranial Nerves: II: Visual Fields are full but cooperation is not good. Pupils are equal, round, and reactive to light.   III,IV, VI: EOMI without ptosis or diploplia.  V: Facial sensation is symmetric to light touch VII: Facial movement is symmetric resting and smiling VIII: Hearing is intact to voice but decreased bilaterally.  X: Palate elevates symmetrically XI: Shoulder shrug is symmetric. XII: Tongue protrudes midline without atrophy or fasciculations.  Motor: Tone is normal. Bulk is normal. 5/5 strength was present in all four extremities.  Sensory: Sensation is symmetric to light touch in the arms and legs. Cerebellar: FNF and HKS are largely intact bilaterally  ASSESSMENT/PLAN Ms. Rachel Hines is a 88 y.o. female with history of HTN, HLD, DM,  presenting with confusion, vision changes, and R homonymous hemianopsia.   Stroke: subacute left PCA stroke Etiology: suspect embolic of cryptogenic source CT head Acute-subacute nonhemorrhagic left medial occipital lobe infarct. CTA head & neck: Common carotid, internal carotid and vertebral arteries are patent within the neck without stenosis. Aneurysmal dilation of the partially imaged ascending and descending thoracic aorta.   Atherosclerotic irregularity of the posterior cerebral arteries without proximal occlusion or proximal high-grade stenosis MRI: Evolving early subacute left PCA distribution infarct involving the mesial left temporoccipital region 2D Echo pending LDL 103 HgbA1c 5.6 VTE prophylaxis - lovenox    Diet   Diet regular Room service appropriate? Yes; Fluid consistency: Thin   No antithrombotic prior to admission, now on aspirin 81 mg daily and clopidogrel 75 mg daily for 3 weeks, then continue ASA indefinitely.  Discussed with cardiology, patient will see Dr. Lovena Le with [facility] in 2-3 weeks for loop recorder placement Therapy recommendations:  Home health OT Disposition:  pending  Hypertension Home meds:  atenolol, spironolactone  Stable Long-term BP goal normotensive  Hyperlipidemia Home meds:  None LDL 103, goal < 70 Per prior notes, she is not on a statin because she is statin intolerant (noted severe muscle pain, bone pain) on atorvastatin. Discussed prior intolerance to statin in room with patient and family. They reported she tried all the statins previously and statins caused her cutaneous lupus to flare up. She reports it has been a while since she has been on a statin and is willing to attempt statin again. Discussed that statins are most effective for secondary prevention of ASCVD events.  Discussed starting trial of crestor. Both patient and family were in agreement. If continued intolerance, can change to Repatha or Praluent as as an  outpatient Start crestor '5mg'$  daily.  Diabetes type II  Controlled Home meds:  on metformin '500mg'$  HgbA1c 5.6, goal < 7.0 CBGs Recent Labs    04/25/22 1553 04/25/22 2208 04/26/22 0618  GLUCAP 90 127* 80    Other Stroke Risk Factors Advanced Age >/= 32   Other Active Problems Cognitive deficits. Discussed that current stroke could be causing worsening of cognitive deficits.  Thoracic aortic aneurysm   Hospital day # 0  Rolanda Lundborg, MD, PGY-1  STROKE MD NOTE :  I have personally obtained history,examined this patient, reviewed notes, independently viewed imaging studies, participated in medical decision making and plan of care.ROS completed by me personally and pertinent positives fully documented  I have made any additions or clarifications directly to the above note. Agree with note above.  Patient presented with a subacute symptoms of initially transient right-sided vision difficulties followed later by coordination and memory difficulties and MRI scan shows left medial temporoparietal embolic infarct etiology to be determined but suspect paroxysmal A-fib.  Continue ongoing stroke work-up and check echocardiogram and telemetry monitoring.  May need outpatient loop recorder after discharge if no definite etiology found.  Recommend aspirin and Plavix for 3 weeks followed by aspirin alone and aggressive risk factor modification and statin for elevated LDL.  Long discussion patient and family at the bedside and answered questions.  Discussed with Dr. Avon Gully greater than 50% time during this 50-minute visit was spent on counseling and coordination of care about her cryptogenic stroke and discussion about evaluation and treatment and answering questions.  Antony Contras, MD Medical Director [facility] Pager: [phone] 04/26/2022 1:38 PM  To contact Stroke Continuity provider, please refer to [email or web address] After hours, contact General Neurology

## 2022-04-26 NOTE — Progress Notes (Signed)
  Echocardiogram 2D Echocardiogram has been performed.  Rachel Hines 04/26/2022, 2:28 PM

## 2022-04-26 NOTE — Plan of Care (Signed)
  Problem: Education: Goal: Knowledge of General Education information will improve Description: Including pain rating scale, medication(s)/side effects and non-pharmacologic comfort measures Outcome: Progressing   Problem: Health Behavior/Discharge Planning: Goal: Ability to manage health-related needs will improve Outcome: Progressing   Problem: Clinical Measurements: Goal: Ability to maintain clinical measurements within normal limits will improve Outcome: Progressing Goal: Will remain free from infection Outcome: Progressing Goal: Diagnostic test results will improve Outcome: Progressing Goal: Respiratory complications will improve Outcome: Progressing Goal: Cardiovascular complication will be avoided Outcome: Progressing   Problem: Activity: Goal: Risk for activity intolerance will decrease Outcome: Progressing   Problem: Nutrition: Goal: Adequate nutrition will be maintained Outcome: Progressing   Problem: Coping: Goal: Level of anxiety will decrease Outcome: Progressing   Problem: Elimination: Goal: Will not experience complications related to bowel motility Outcome: Progressing Goal: Will not experience complications related to urinary retention Outcome: Progressing   Problem: Pain Managment: Goal: General experience of comfort will improve Outcome: Progressing   Problem: Safety: Goal: Ability to remain free from injury will improve Outcome: Progressing   Problem: Skin Integrity: Goal: Risk for impaired skin integrity will decrease Outcome: Progressing   Problem: Education: Goal: Ability to describe self-care measures that may prevent or decrease complications (Diabetes Survival Skills Education) will improve Outcome: Progressing Goal: Individualized Educational Video(s) Outcome: Progressing   Problem: Coping: Goal: Ability to adjust to condition or change in health will improve Outcome: Progressing   Problem: Fluid Volume: Goal: Ability to  maintain a balanced intake and output will improve Outcome: Progressing   Problem: Health Behavior/Discharge Planning: Goal: Ability to identify and utilize available resources and services will improve Outcome: Progressing Goal: Ability to manage health-related needs will improve Outcome: Progressing   Problem: Metabolic: Goal: Ability to maintain appropriate glucose levels will improve Outcome: Progressing   Problem: Nutritional: Goal: Maintenance of adequate nutrition will improve Outcome: Progressing Goal: Progress toward achieving an optimal weight will improve Outcome: Progressing   Problem: Skin Integrity: Goal: Risk for impaired skin integrity will decrease Outcome: Progressing   Problem: Tissue Perfusion: Goal: Adequacy of tissue perfusion will improve Outcome: Progressing   Problem: Education: Goal: Knowledge of disease or condition will improve Outcome: Progressing Goal: Knowledge of secondary prevention will improve (SELECT ALL) Outcome: Progressing Goal: Knowledge of patient specific risk factors will improve (INDIVIDUALIZE FOR PATIENT) Outcome: Progressing Goal: Individualized Educational Video(s) Outcome: Progressing   Problem: Coping: Goal: Will identify appropriate support needs Outcome: Progressing   Problem: Self-Care: Goal: Ability to participate in self-care as condition permits will improve Outcome: Progressing   Problem: Ischemic Stroke/TIA Tissue Perfusion: Goal: Complications of ischemic stroke/TIA will be minimized Outcome: Progressing

## 2022-04-26 NOTE — Progress Notes (Signed)
Discharge instructions have been printed and reviewed with patient and family.  They verbalize understanding and has no questions.Peripheral IV has been removed.

## 2022-04-26 NOTE — Evaluation (Signed)
Occupational Therapy Evaluation Patient Details Name: Rachel Hines MRN: [id number] DOB: 04-13-1931 Today's Date: 04/26/2022   History of Present Illness Pt is an 88 y/o F presenting to ED on 8/11 with R vision loss and L sided headache. CT adn MRI revealing L PCA infarct involving L temporoccipital region. PMH includes GERD, arthritis, HLD, and DM2.   Clinical Impression   Pt independent at baseline with ADLs and functional mobility, lives alone, but has family across the street can assist PRN. Pt reports daughter assists with medications and transportation. Pt uses cane outside of home for mobility. Pt currently min guard A for ADLs, bed mobility, and transfers. Reports blurriness of vision in R eye with L eye occluded, decreased smoothness of tracking noted, however functional for ADLs/mobility. Pt presenting with impairments listed below, will follow acutely. Recommend HHOT at d/c.     Recommendations for follow up therapy are one component of a multi-disciplinary discharge planning process, led by the attending physician.  Recommendations may be updated based on patient status, additional functional criteria and insurance authorization.   Follow Up Recommendations  Home health OT    Assistance Recommended at Discharge Intermittent Supervision/Assistance  Patient can return home with the following Direct supervision/assist for medications management;Direct supervision/assist for financial management;Assistance with cooking/housework;Assist for transportation;A little help with bathing/dressing/bathroom    Functional Status Assessment  Patient has had a recent decline in their functional status and demonstrates the ability to make significant improvements in function in a reasonable and predictable amount of time.  Equipment Recommendations  BSC/3in1 (as shower seat)    Recommendations for Other Services PT consult     Precautions / Restrictions Precautions Precautions:  Fall Restrictions Weight Bearing Restrictions: No      Mobility Bed Mobility Overal bed mobility: Needs Assistance Bed Mobility: Supine to Sit     Supine to sit: Min guard     General bed mobility comments: increased time    Transfers Overall transfer level: Needs assistance Equipment used: 1 person hand held assist Transfers: Sit to/from Stand, Bed to chair/wheelchair/BSC Sit to Stand: Min guard Stand pivot transfers: Min guard                Balance Overall balance assessment: Needs assistance Sitting-balance support: Feet supported Sitting balance-Leahy Scale: Normal Sitting balance - Comments: can reach outside BOS towards feet to don sock without LOB   Standing balance support: During functional activity Standing balance-Leahy Scale: Fair Standing balance comment: needs min external assist                           ADL either performed or assessed with clinical judgement   ADL Overall ADL's : Needs assistance/impaired Eating/Feeding: Supervision/ safety   Grooming: Supervision/safety;Wash/dry face   Upper Body Bathing: Min guard   Lower Body Bathing: Min guard   Upper Body Dressing : Min guard   Lower Body Dressing: Supervision/safety;Sitting/lateral leans;Sit to/from stand Lower Body Dressing Details (indicate cue type and reason): dons socks sitting EOB Toilet Transfer: Min guard;Ambulation;Regular Museum/gallery exhibitions officer and Hygiene: Min guard       Functional mobility during ADLs: Min guard       Vision Baseline Vision/History: 1 Wears glasses Vision Assessment?: Yes Eye Alignment: Within Functional Limits Ocular Range of Motion: Within Functional Limits Alignment/Gaze Preference: Within Defined Limits Tracking/Visual Pursuits: Decreased smoothness of horizontal tracking;Decreased smoothness of vertical tracking;Unable to hold eye position out of midline Saccades: Decreased speed  of saccadic  movement;Additional eye shifts occurred during testing     Perception     Praxis      Pertinent Vitals/Pain Pain Assessment Pain Assessment: No/denies pain     Hand Dominance Right   Extremity/Trunk Assessment Upper Extremity Assessment Upper Extremity Assessment: Generalized weakness   Lower Extremity Assessment Lower Extremity Assessment: Defer to PT evaluation   Cervical / Trunk Assessment Cervical / Trunk Assessment: Kyphotic   Communication Communication Communication: HOH   Cognition Arousal/Alertness: Awake/alert Behavior During Therapy: WFL for tasks assessed/performed Overall Cognitive Status: Impaired/Different from baseline Area of Impairment: Orientation, Attention, Memory, Following commands, Safety/judgement, Awareness, Problem solving                 Orientation Level: Disoriented to, Place, Time Current Attention Level: Focused Memory: Decreased short-term memory Following Commands: Follows one step commands with increased time Safety/Judgement: Decreased awareness of safety, Decreased awareness of deficits Awareness: Intellectual Problem Solving: Slow processing, Difficulty sequencing, Requires verbal cues, Decreased initiation       General Comments  VSS on RA    Exercises     Shoulder Instructions      Home Living Family/patient expects to be discharged to:: Private residence Living Arrangements: Alone Available Help at Discharge: Family;Available PRN/intermittently Type of Home: House Home Access: Level entry     Home Layout: Two level;Bed/bath upstairs Alternate Level Stairs-Number of Steps: flight w/ rails   Bathroom Shower/Tub: Walk-in shower         Home Equipment: Cane - single point   Additional Comments: daughter and granddaughter lives nearby, checks in on pt daily      Prior Functioning/Environment Prior Level of Function : Independent/Modified Independent;Driving             Mobility Comments: uses cane  for mobility outside home, wall walks inside home ADLs Comments: pt does med mgmt, 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, and is ind with ADLs        OT Problem List: Decreased strength;Decreased range of motion;Decreased activity tolerance;Impaired balance (sitting and/or standing);Decreased coordination;Decreased safety awareness      OT Treatment/Interventions: Self-care/ADL training;Therapeutic exercise;Energy conservation;DME and/or AE instruction;Therapeutic activities;Visual/perceptual remediation/compensation;Cognitive remediation/compensation;Patient/family education;Balance training    OT Goals(Current goals can be found in the care plan section) Acute Rehab OT Goals OT Goal Formulation: With patient Time For Goal Achievement: 05/10/22 Potential to Achieve Goals: Good  OT Frequency: Min 2X/week    Co-evaluation              AM-PAC OT "6 Clicks" Daily Activity     Outcome Measure Help from another person eating meals?: None Help from another person taking care of personal grooming?: None Help from another person toileting, which includes using toliet, bedpan, or urinal?: A Little Help from another person bathing (including washing, rinsing, drying)?: A Little Help from another person to put on and taking off regular upper body clothing?: A Little Help from another person to put on and taking off regular lower body clothing?: A Little 6 Click Score: 20   End of Session Equipment Utilized During Treatment: Gait belt Nurse Communication: Mobility status  Activity Tolerance: Patient tolerated treatment well Patient left: in chair;with call bell/phone within reach;with chair alarm set  OT Visit Diagnosis: Unsteadiness on feet (R26.81);Other abnormalities of gait and mobility (R26.89);Muscle weakness (generalized) (M62.81);Low vision, both eyes (H54.2);Other symptoms and signs involving cognitive function                Time: 1610-9604 OT Time Calculation (min): 25 min  Charges:   OT General Charges $OT Visit: 1 Visit OT Evaluation $OT Eval Moderate Complexity: 1 Mod OT Treatments $Self Care/Home Management : 8-22 mins  Lynnda Child, OTD, OTR/L Acute Rehab [phone] [place] 04/26/2022, 12:18 PM

## 2022-04-26 NOTE — Discharge Summary (Signed)
Physician Discharge Summary  Rachel Hines AVW:[id number] DOB: 30-Apr-1931 DOA: 04/25/2022  PCP: Carol Ada, MD  Admit date: 04/25/2022 Discharge date: 04/26/2022  Admitted From: Home Disposition: Home  Recommendations for Outpatient Follow-up:  Follow up with PCP in 1-2 weeks Follow-up with cardiology for loop recorder placement Follow-up with neurology as scheduled  Home Health: None Equipment/Devices: None  Discharge Condition: Stable CODE STATUS: Full Diet recommendation: Low-salt low-fat low-carb diet  Brief/Interim Summary: Rachel Hines is a pleasant 88 y.o. female with medical history significant for hypertension, thoracic aortic aneurysm, type 2 diabetes mellitus, and hyperlipidemia with statin intolerance, now presenting to the emergency department with 2-3 days of confusion, impaired vision, and headache.  Patient admitted as above due to have acute to subacute ischemic CVA involving the left occipital lobe.  Neurology following recommending Plavix x21 days, aspirin 81 mg ongoing as well as low-dose Crestor.  Patient does have known history of difficulty tolerating statins, we discussed every other day dosing as well if 5 mg daily is not tolerated.  Patient's other chronic comorbid conditions appear stable, A1c less than 6, blood pressure and creatinine remain at baseline.  Known history of thoracic aortic aneurysm nonoperative per CT surgery remains asymptomatic.  At this time patient is otherwise stable for discharge, follow-up outpatient with cardiology for loop recorder placement, follow-up with neurology as scheduled for ongoing evaluation and treatment as well as with PCP for routine care.  Discharge Diagnoses:  Principal Problem:   Ischemic stroke [facility]) Active Problems:   Ascending aorta dilatation (HCC)   Essential hypertension   Type 2 diabetes mellitus with other specified complication (HCC)   Stage 3a chronic kidney disease (CKD) ([place])    Discharge  Instructions  Discharge Instructions     Call MD for:  persistant dizziness or light-headedness   Complete by: As directed    Diet - low sodium heart healthy   Complete by: As directed    Discharge patient   Complete by: As directed    Discharge disposition: 01-Home or Self Care   Discharge patient date: 04/26/2022   Increase activity slowly   Complete by: As directed       Allergies as of 04/26/2022       Reactions   Atorvastatin Other (See Comments)   Severe muscle pain, bone pain   Other Rash, Other (See Comments)   Food dyes- Rashes and joint pain        Medication List     STOP taking these medications    metFORMIN 500 MG 24 hr tablet Commonly known as: GLUCOPHAGE-XR       TAKE these medications    acetaminophen 500 MG tablet Commonly known as: TYLENOL Take 500 mg by mouth every 6 (six) hours as needed for mild pain or headache.   aspirin EC 81 MG tablet Take 1 tablet (81 mg total) by mouth daily. Swallow whole. Start taking on: April 27, 2022   atenolol 100 MG tablet Commonly known as: TENORMIN Take 100 mg by mouth every morning.   clopidogrel 75 MG tablet Commonly known as: PLAVIX Take 1 tablet (75 mg total) by mouth daily. Start taking on: April 27, 2022   olmesartan 40 MG tablet Commonly known as: BENICAR Take 40 mg by mouth daily.   rosuvastatin 5 MG tablet Commonly known as: CRESTOR Take 1 tablet (5 mg total) by mouth daily.   spironolactone 25 MG tablet Commonly known as: ALDACTONE Take 0.5 tablets (12.5 mg total) by mouth daily. Start  taking on: April 27, 2022 What changed: how much to take   zolpidem 10 MG tablet Commonly known as: AMBIEN Take 10 mg by mouth at bedtime.        Allergies  Allergen Reactions   Atorvastatin Other (See Comments)    Severe muscle pain, bone pain   Other Rash and Other (See Comments)    Food dyes- Rashes and joint pain    Consultations: Neurology  Procedures/Studies: ECHOCARDIOGRAM  COMPLETE  Result Date: 04/26/2022    ECHOCARDIOGRAM REPORT   Patient Name:   Rachel Hines Date of Exam: 04/26/2022 Medical Rec #:  [id number]      Height:       63.0 in Accession #:    [id number]     Weight:       155.6 lb Date of Birth:  10-Sep-1931      BSA:          1.738 m Patient Age:    65 years       BP:           121/46 mmHg Patient Gender: F              HR:           62 bpm. Exam Location:  Inpatient Procedure: 2D Echo Indications:    stroke  History:        Patient has no prior history of Echocardiogram examinations.                 Ascending aorta dilatation, chronic kidney disease; Risk                 Factors:Hypertension, Dyslipidemia and Diabetes.  Sonographer:    Johny Chess RDCS Referring Phys: [id number] [place]  1. Left ventricular ejection fraction, by estimation, is 60 to 65%. The left ventricle has normal function. The left ventricle has no regional wall motion abnormalities. There is mild asymmetric left ventricular hypertrophy of the septal segment. Left ventricular diastolic parameters are indeterminate.  2. Right ventricular systolic function is normal. The right ventricular size is normal. Tricuspid regurgitation signal is inadequate for assessing PA pressure.  3. The mitral valve is grossly normal. Trivial mitral valve regurgitation. No evidence of mitral stenosis.  4. The aortic valve is tricuspid. There is mild calcification of the aortic valve. There is mild thickening of the aortic valve. Aortic valve regurgitation is mild. Aortic valve sclerosis is present, with no evidence of aortic valve stenosis.  5. Aneurysm of the ascending aorta, measuring 50 mm.  6. The inferior vena cava is normal in size with greater than 50% respiratory variability, suggesting right atrial pressure of 3 mmHg. Conclusion(s)/Recommendation(s): No intracardiac source of embolism detected on this transthoracic study. Consider a transesophageal echocardiogram to exclude cardiac source of  embolism if clinically indicated. FINDINGS  Left Ventricle: Left ventricular ejection fraction, by estimation, is 60 to 65%. The left ventricle has normal function. The left ventricle has no regional wall motion abnormalities. The left ventricular internal cavity size was normal in size. There is  mild asymmetric left ventricular hypertrophy of the septal segment. Left ventricular diastolic parameters are indeterminate. Right Ventricle: The right ventricular size is normal. No increase in right ventricular wall thickness. Right ventricular systolic function is normal. Tricuspid regurgitation signal is inadequate for assessing PA pressure. Left Atrium: Left atrial size was normal in size. Right Atrium: Right atrial size was normal in size. Pericardium: Trivial pericardial effusion is present. Presence of epicardial  fat layer. Mitral Valve: The mitral valve is grossly normal. Trivial mitral valve regurgitation. No evidence of mitral valve stenosis. Tricuspid Valve: The tricuspid valve is grossly normal. Tricuspid valve regurgitation is not demonstrated. No evidence of tricuspid stenosis. Aortic Valve: The aortic valve is tricuspid. There is mild calcification of the aortic valve. There is mild thickening of the aortic valve. Aortic valve regurgitation is mild. Aortic regurgitation PHT measures 696 msec. Aortic valve sclerosis is present,  with no evidence of aortic valve stenosis. Pulmonic Valve: The pulmonic valve was grossly normal. Pulmonic valve regurgitation is not visualized. No evidence of pulmonic stenosis. Aorta: The aortic root is normal in size and structure. There is an aneurysm involving the ascending aorta measuring 50 mm. Venous: The inferior vena cava is normal in size with greater than 50% respiratory variability, suggesting right atrial pressure of 3 mmHg. IAS/Shunts: The atrial septum is grossly normal.  LEFT VENTRICLE PLAX 2D LVIDd:         4.80 cm   Diastology LVIDs:         2.80 cm   LV e'  lateral:   6.42 cm/s LV PW:         0.90 cm   LV E/e' lateral: 11.6 LV IVS:        1.20 cm LVOT diam:     1.60 cm LV SV:         55 LV SV Index:   32 LVOT Area:     2.01 cm  RIGHT VENTRICLE             IVC RV S prime:     10.80 cm/s  IVC diam: 1.30 cm LEFT ATRIUM             Index LA diam:        2.80 cm 1.61 cm/m LA Vol (A2C):   46.0 ml 26.46 ml/m LA Vol (A4C):   46.7 ml 26.87 ml/m LA Biplane Vol: 47.6 ml 27.38 ml/m  AORTIC VALVE LVOT Vmax:   118.00 cm/s LVOT Vmean:  81.900 cm/s LVOT VTI:    0.275 m AI PHT:      696 msec  AORTA Ao Asc diam: 5.00 cm MITRAL VALVE MV Area (PHT): 2.56 cm    SHUNTS MV Decel Time: 296 msec    Systemic VTI:  0.28 m MV E velocity: 74.40 cm/s  Systemic Diam: 1.60 cm MV A velocity: 93.40 cm/s MV E/A ratio:  0.80 Eleonore Chiquito MD Electronically signed by Eleonore Chiquito MD Signature Date/Time: 04/26/2022/2:37:44 PM    Final    MR BRAIN WO CONTRAST  Result Date: 04/26/2022 CLINICAL DATA:  Follow-up examination for acute stroke. EXAM: MRI HEAD WITHOUT CONTRAST TECHNIQUE: Multiplanar, multiecho pulse sequences of the brain and surrounding structures were obtained without intravenous contrast. COMPARISON:  Comparison made with prior CTs from 04/25/2022. FINDINGS: Brain: Cerebral volume within normal limits for age. Mild scattered patchy T2/FLAIR hyperintensity involving the periventricular and deep white matter both cerebral hemispheres, most consistent with chronic small vessel ischemic disease, minor for age. Confluent diffusion signal abnormality involving the mesial left temporal occipital region, consistent with an evolving early subacute left PCA distribution infarct. Involvement of the posterior left hippocampal formation noted. Localized gyral swelling without significant regional mass effect. Minimal associated petechial blood products without hemorrhagic transformation (series 14, image 23). No other evidence for acute or subacute ischemia. Gray-white matter differentiation  otherwise maintained. No other areas of chronic cortical infarction. No other acute or chronic intracranial blood products.  No mass lesion, midline shift or mass effect. No hydrocephalus or extra-axial fluid collection. Pituitary gland and suprasellar region within normal limits. Vascular: Major intracranial vascular flow voids are maintained. Skull and upper cervical spine: Craniocervical junction within normal limits. Bone marrow signal intensity normal. 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 No scalp soft tissue abnormality. Sinuses/Orbits: Prior bilateral ocular lens replacement. Paranasal sinuses are largely clear. No significant mastoid effusion. Other: None. IMPRESSION: 1. Evolving early subacute left PCA distribution infarct involving the mesial left temporoccipital region. Minimal associated petechial blood products without hemorrhagic transformation. No significant regional mass effect. 2. No other acute intracranial abnormality. 3. Underlying mild for age chronic microvascular ischemic disease. Electronically Signed   By: Jeannine Boga M.D.   On: 04/26/2022 04:28   CT ANGIO HEAD NECK W WO CM  Result Date: 04/25/2022 CLINICAL DATA:  Stroke/TIA, determine embolic source. EXAM: CT ANGIOGRAPHY HEAD AND NECK TECHNIQUE: Multidetector CT imaging of the head and neck was performed using the standard protocol during bolus administration of intravenous contrast. Multiplanar CT image reconstructions and MIPs were obtained to evaluate the vascular anatomy. Carotid stenosis measurements (when applicable) are obtained utilizing NASCET criteria, using the distal internal carotid diameter as the denominator. RADIATION DOSE REDUCTION: This exam was performed according to the departmental dose-optimization program which includes automated exposure control, adjustment of the mA and/or kV according to patient size and/or use of iterative reconstruction technique. CONTRAST:  62m OMNIPAQUE IOHEXOL 350  MG/ML SOLN COMPARISON:  None Available. FINDINGS: CTA NECK FINDINGS Aortic arch: Aneurysmal dilation of the partially imaged ascending and descending aorta, measuring up to 4.3 cm in diameter at the imaged levels. Standard aortic branching. Atherosclerotic plaque within the visualized aortic arch and proximal major branch vessels of the neck. No hemodynamically significant innominate or proximal subclavian artery stenosis. Right carotid system: CCA and ICA patent within the neck without stenosis. Mild atherosclerotic plaque about the carotid bifurcation. Left carotid system: CCA and ICA patent within the neck without stenosis. Mild atherosclerotic plaque at the CCA origin and about the carotid bifurcation. Vertebral arteries: Vertebral arteries codominant and patent within the neck without stenosis or significant atherosclerotic disease. 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 Cervical spondylosis. Chronic fracture deformity of the manubrium. No acute fracture or aggressive osseous lesion. Other neck: No neck mass or cervical lymphadenopathy. Upper chest: No consolidation within the imaged lung apices. Review of the MIP images confirms the above findings CTA HEAD FINDINGS Anterior circulation: The intracranial internal carotid arteries are patent. Atherosclerotic plaque within both vessels with no more than mild stenosis. The M1 middle cerebral arteries are patent. No M2 proximal branch occlusion or high-grade proximal stenosis. The anterior cerebral arteries are patent. No intracranial aneurysm is identified. Posterior circulation: The intracranial vertebral arteries are patent. The basilar artery is patent. Atherosclerotic irregularity of the posterior cerebral arteries bilaterally without proximal occlusion or proximal high-grade stenosis. A right posterior communicating artery is present. The left posterior communicating artery is diminutive or absent. Venous sinuses:  Within the limitations of contrast timing, no convincing thrombus. Anatomic variants: As described. Review of the MIP images confirms the above findings CTA neck impression #2 will be called to the ordering clinician or representative by the Radiologist Assistant, and communication documented in the PACS or [employer] IMPRESSION: CTA neck: 1. The common carotid, internal carotid and vertebral arteries are patent within the neck without stenosis. Mild atherosclerotic plaque within the carotid systems within the neck, as described. 2. Aneurysmal dilation  of the partially imaged ascending and descending thoracic aorta (measuring up to 4.3 cm in diameter at the imaged levels). A dedicated CT angiogram of the chest is recommended for further evaluation. 3.  Aortic Atherosclerosis (ICD10-I70.0). CTA head: 1. Atherosclerotic irregularity of the posterior cerebral arteries without proximal occlusion or proximal high-grade stenosis. 2. No intracranial large vessel occlusion or proximal high-grade arterial stenosis elsewhere. 3. Mild atherosclerotic plaque within the intracranial internal carotid arteries with no more than mild stenosis. Electronically Signed   By: Kellie Simmering D.O.   On: 04/25/2022 12:37   CT HEAD WO CONTRAST  Result Date: 04/25/2022 CLINICAL DATA:  Loss of vision started 2 days ago. EXAM: CT HEAD WITHOUT CONTRAST TECHNIQUE: Contiguous axial images were obtained from the base of the skull through the vertex without intravenous contrast. RADIATION DOSE REDUCTION: This exam was performed according to the departmental dose-optimization program which includes automated exposure control, adjustment of the mA and/or kV according to patient size and/or use of iterative reconstruction technique. COMPARISON:  11/21/2021 FINDINGS: Brain: Low attenuation involving the left medial occipital lobe concerning for acute versus subacute infarct. No evidence of hemorrhage, extra-axial collection, ventriculomegaly,  or mass effect. Generalized cerebral atrophy. Periventricular white matter low attenuation likely secondary to microangiopathy. Vascular: Cerebrovascular atherosclerotic calcifications are noted. Skull: Negative for fracture or focal lesion. Sinuses/Orbits: Visualized portions of the orbits are unremarkable. Visualized portions of the paranasal sinuses are unremarkable. Visualized portions of the mastoid air cells are unremarkable. Other: None. IMPRESSION: 1. Acute-subacute nonhemorrhagic left medial occipital lobe infarct. Critical Value/emergent results were called by telephone at the time of interpretation on 04/25/2022 at 10:52 am to provider [facility] , who verbally acknowledged these results. Electronically Signed   By: Kathreen Devoid M.D.   On: 04/25/2022 10:53     Subjective: No acute issues or events overnight, back to baseline denies nausea vomiting diarrhea constipation headache fevers chills or chest pain   Discharge Exam: Vitals:   04/26/22 0804 04/26/22 1305  BP: (!) 164/76 (!) 121/46  Pulse: (!) 56 67  Resp: 18 18  Temp: 98.3 F (36.8 C)   SpO2: 97%    Vitals:   04/26/22 0418 04/26/22 0500 04/26/22 0804 04/26/22 1305  BP: (!) 146/62  (!) 164/76 (!) 121/46  Pulse: (!) 54  (!) 56 67  Resp:   18 18  Temp: 98 F (36.7 C)  98.3 F (36.8 C)   TempSrc: Oral     SpO2: 95%  97%   Weight:  70.6 kg    Height:        General: Pt is alert, awake, not in acute distress Cardiovascular: RRR, S1/S2 +, no rubs, no gallops Respiratory: CTA bilaterally, no wheezing, no rhonchi Abdominal: Soft, NT, ND, bowel sounds + Extremities: no edema, no cyanosis    The results of significant diagnostics from this hospitalization (including imaging, microbiology, ancillary and laboratory) are listed below for reference.     Microbiology: No results found for this or any previous visit (from the past 240 hour(s)).   Labs: BNP (last 3 results) No results for input(s): "BNP" in the last  8760 hours. Basic Metabolic Panel: Recent Labs  Lab 04/25/22 1119 04/26/22 0213  NA 138 140  K 4.7 4.9  CL 104 108  CO2 24 25  GLUCOSE 100* 85  BUN 31* 27*  CREATININE 1.10* 1.14*  CALCIUM 9.6 9.1   Liver Function Tests: Recent Labs  Lab 04/25/22 1119 04/26/22 0213  AST 13* 14*  ALT  9 10  ALKPHOS 84 86  BILITOT 0.7 0.8  PROT 7.2 6.4*  ALBUMIN 4.0 3.5   No results for input(s): "LIPASE", "AMYLASE" in the last 168 hours. No results for input(s): "AMMONIA" in the last 168 hours. CBC: Recent Labs  Lab 04/25/22 1119 04/26/22 0213  WBC 6.8 7.5  NEUTROABS 4.2  --   HGB 13.3 12.9  HCT 39.5 38.1  MCV 91.6 92.0  PLT 202 180   Cardiac Enzymes: No results for input(s): "CKTOTAL", "CKMB", "CKMBINDEX", "TROPONINI" in the last 168 hours. BNP: Invalid input(s): "POCBNP" CBG: Recent Labs  Lab 04/25/22 1553 04/25/22 2208 04/26/22 0618 04/26/22 1237  GLUCAP 90 127* 80 178*   D-Dimer No results for input(s): "DDIMER" in the last 72 hours. Hgb A1c Recent Labs    04/26/22 0213  HGBA1C 5.6   Lipid Profile Recent Labs    04/26/22 0213  CHOL 154  HDL 33*  LDLCALC 103*  TRIG 92  CHOLHDL 4.7   Thyroid function studies No results for input(s): "TSH", "T4TOTAL", "T3FREE", "THYROIDAB" in the last 72 hours.  Invalid input(s): "FREET3" Anemia work up No results for input(s): "VITAMINB12", "FOLATE", "FERRITIN", "TIBC", "IRON", "RETICCTPCT" in the last 72 hours. Urinalysis    Component Value Date/Time   COLORURINE COLORLESS (A) 04/25/2022 1316   APPEARANCEUR CLEAR 04/25/2022 1316   LABSPEC 1.015 04/25/2022 1316   PHURINE 6.0 04/25/2022 1316   GLUCOSEU NEGATIVE 04/25/2022 1316   HGBUR NEGATIVE 04/25/2022 1316   BILIRUBINUR NEGATIVE 04/25/2022 1316   KETONESUR NEGATIVE 04/25/2022 1316   PROTEINUR NEGATIVE 04/25/2022 1316   NITRITE NEGATIVE 04/25/2022 1316   LEUKOCYTESUR SMALL (A) 04/25/2022 1316   Sepsis Labs Recent Labs  Lab 04/25/22 1119 04/26/22 0213   WBC 6.8 7.5   Microbiology No results found for this or any previous visit (from the past 240 hour(s)).   Time coordinating discharge: Over 30 minutes  SIGNED:   Little Ishikawa, DO [facility] 04/26/2022, 2:57 PM Pager   If 7PM-7AM, please contact night-coverage [email or web address]

## 2022-04-26 NOTE — Evaluation (Signed)
Physical Therapy Evaluation Patient Details Name: Rachel Hines MRN: [id number] DOB: July 01, 1931 Today's Date: 04/26/2022  History of Present Illness  Pt is an 88 y/o F presenting to ED on 8/11 with R vision loss and L sided headache. CT adn MRI revealing L PCA infarct involving L temporoccipital region. PMH includes GERD, arthritis, HLD, and DM2.   Clinical Impression  Pt admitted with above. Pt with noted R vision deficits, R inattention, and impaired executive functioning in addition to decreased insight to deficits and safety. Pt mobilizes well with use of SPC in R UE. Spoke at length with dtr about providing 24/7 assist for first few days and how to assist pt with driving, medication management, and paying bills. Acute PT to cont to follow.       Recommendations for follow up therapy are one component of a multi-disciplinary discharge planning process, led by the attending physician.  Recommendations may be updated based on patient status, additional functional criteria and insurance authorization.  Follow Up Recommendations Home health PT      Assistance Recommended at Discharge Frequent or constant Supervision/Assistance (dtr to stay 24/7 for first few days)  Patient can return home with the following  A little help with walking and/or transfers;A little help with bathing/dressing/bathroom;Assistance with cooking/housework;Direct supervision/assist for medications management;Direct supervision/assist for financial management;Assist for transportation;Help with stairs or ramp for entrance    Equipment Recommendations None recommended by PT  Recommendations for Other Services       Functional Status Assessment Patient has had a recent decline in their functional status and demonstrates the ability to make significant improvements in function in a reasonable and predictable amount of time.     Precautions / Restrictions Precautions Precautions: Fall Restrictions Weight Bearing  Restrictions: No      Mobility  Bed Mobility Overal bed mobility: Needs Assistance Bed Mobility: Supine to Sit     Supine to sit: Min guard     General bed mobility comments: pt up in the chair upon PT arrival    Transfers Overall transfer level: Needs assistance Equipment used: Straight cane Transfers: Sit to/from Stand Sit to Stand: Min guard Stand pivot transfers: Min guard         General transfer comment: pt used bilat arm rests, good cane management, increased time    Ambulation/Gait Ambulation/Gait assistance: Min guard Gait Distance (Feet): 175 Feet Assistive device: Straight cane Gait Pattern/deviations: Step-through pattern, Decreased stride length Gait velocity: dec Gait velocity interpretation: 1.31 - 2.62 ft/sec, indicative of limited community ambulator   General Gait Details: pt with good sequencing of cane in R UE, pt with shorter step height and length bilat however not shuffling, no episode of LOB or R LE instability  Stairs Stairs: Yes Stairs assistance: Min guard Stair Management: Two rails, Alternating pattern Number of Stairs: 4 General stair comments: pt steady with use of bilat HR mimicing home set up  Wheelchair Mobility    Modified Rankin (Stroke Patients Only) Modified Rankin (Stroke Patients Only) Pre-Morbid Rankin Score: Moderate disability Modified Rankin: Moderate disability     Balance Overall balance assessment: Needs assistance Sitting-balance support: Feet supported Sitting balance-Leahy Scale: Normal Sitting balance - Comments: can reach outside BOS towards feet to don sock without LOB   Standing balance support: During functional activity Standing balance-Leahy Scale: Fair Standing balance comment: needs min external assist  Pertinent Vitals/Pain Pain Assessment Pain Assessment: No/denies pain    Home Living Family/patient expects to be discharged to:: Private  residence Living Arrangements: Alone Available Help at Discharge: Family;Available PRN/intermittently Type of Home: House Home Access: Level entry     Alternate Level Stairs-Number of Steps: flight w/ rails Home Layout: Two level;Bed/bath upstairs Home Equipment: Cane - single point Additional Comments: daughter and granddaughter lives nearby, checks in on pt daily, dtr can stay with pt 24/7    Prior Function Prior Level of Function : Independent/Modified Independent;Driving             Mobility Comments: uses cane for mobility outside home, wall walks inside home ADLs Comments: pt does med mgmt, 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, and is ind with ADLs, dtr has been driving as of last month, dtr manages medication but pt takes it on her own, dtr starting to help with paying bills     Hand Dominance   Dominant Hand: Right    Extremity/Trunk Assessment   Upper Extremity Assessment Upper Extremity Assessment: Generalized weakness    Lower Extremity Assessment Lower Extremity Assessment: Generalized weakness    Cervical / Trunk Assessment Cervical / Trunk Assessment: Kyphotic  Communication   Communication: HOH  Cognition Arousal/Alertness: Awake/alert Behavior During Therapy: WFL for tasks assessed/performed Overall Cognitive Status: Impaired/Different from baseline Area of Impairment: Attention, Memory, Following commands, Safety/judgement, Awareness, Problem solving                 Orientation Level: Disoriented to, Time, Situation (new she was in the hospital but not that it) Current Attention Level: Focused Memory: Decreased short-term memory Following Commands: Follows one step commands with increased time Safety/Judgement: Decreased awareness of safety, Decreased awareness of deficits Awareness: Intellectual Problem Solving: Slow processing, Difficulty sequencing, Requires verbal cues, Decreased initiation General Comments: pt with noted R inattention, decreased  insight to deficits and safety, impaired executive function, inability to navigate hallway back to room despite max verbal cues        General Comments General comments (skin integrity, edema, etc.): VSS on RA    Exercises     Assessment/Plan    PT Assessment Patient needs continued PT services  PT Problem List Decreased strength;Decreased activity tolerance;Decreased balance;Decreased mobility;Decreased safety awareness       PT Treatment Interventions DME instruction;Gait training;Stair training;Functional mobility training;Therapeutic activities;Therapeutic exercise;Balance training;Neuromuscular re-education;Cognitive remediation    PT Goals (Current goals can be found in the Care Plan section)  Acute Rehab PT Goals Patient Stated Goal: home PT Goal Formulation: With patient/family Time For Goal Achievement: 05/10/22 Potential to Achieve Goals: Good    Frequency Min 3X/week     Co-evaluation               AM-PAC PT "6 Clicks" Mobility  Outcome Measure Help needed turning from your back to your side while in a flat bed without using bedrails?: None Help needed moving from lying on your back to sitting on the side of a flat bed without using bedrails?: None Help needed moving to and from a bed to a chair (including a wheelchair)?: A Little Help needed standing up from a chair using your arms (e.g., wheelchair or bedside chair)?: A Little Help needed to walk in hospital room?: A Little Help needed climbing 3-5 steps with a railing? : A Little 6 Click Score: 20    End of Session Equipment Utilized During Treatment: Gait belt Activity Tolerance: Patient tolerated treatment well Patient left: in chair;with call bell/phone within reach;with  chair alarm set;with family/visitor present Nurse Communication: Mobility status PT Visit Diagnosis: Unsteadiness on feet (R26.81);Muscle weakness (generalized) (M62.81)    Time: 9802-2179 PT Time Calculation (min) (ACUTE  ONLY): 22 min   Charges:   PT Evaluation $PT Eval Low Complexity: 1 Low          Kittie Plater, PT, DPT Acute Rehabilitation Services Secure chat preferred Office #: [phone]   Berline Lopes 04/26/2022, 12:27 PM

## 2022-04-26 NOTE — TOC Transition Note (Signed)
Transition of Care [facility]) - CM/SW Discharge Note   Patient Details  Name: Rachel Hines MRN: [id number] Date of Birth: Oct 02, 1930  [facility] CM/SW Contact:  Carles Collet, RN Phone Number: 04/26/2022, 3:19 PM   Clinical Narrative:   Damaris Schooner w patient's daughter on the phone to review therapy recs. She is agreeable to [facility] services. She does not have preference or experience with prior [facility] services, agreeable for CM to choose provider. Referral accepted by [facility].  No DME needs. Daughter to transport home    Final next level of care: [place] Barriers to Discharge: No Barriers Identified   Patient Goals and CMS Choice Patient states their goals for this hospitalization and ongoing recovery are:: to go home CMS Medicare.gov Compare Post Acute Care list provided to:: Other (Comment Required) Choice offered to / list presented to : Adult Children  Discharge Placement                       Discharge Plan and Services                DME Arranged: N/A         HH Arranged: OT, PT HH Agency: [place] Date HH Agency Contacted: 04/26/22 Time [place]: 1519 Representative spoke with at [place]: Malachy Mood  Social Determinants of Health ([place]) Interventions     Readmission Risk Interventions     No data to display

## 2022-05-01 DIAGNOSIS — E1169 Type 2 diabetes mellitus with other specified complication: Secondary | ICD-10-CM | POA: Diagnosis not present

## 2022-05-01 DIAGNOSIS — I639 Cerebral infarction, unspecified: Secondary | ICD-10-CM | POA: Diagnosis not present

## 2022-05-01 DIAGNOSIS — Z09 Encounter for follow-up examination after completed treatment for conditions other than malignant neoplasm: Secondary | ICD-10-CM | POA: Diagnosis not present

## 2022-05-01 DIAGNOSIS — I10 Essential (primary) hypertension: Secondary | ICD-10-CM | POA: Diagnosis not present

## 2022-05-21 ENCOUNTER — Ambulatory Visit (INDEPENDENT_AMBULATORY_CARE_PROVIDER_SITE_OTHER): Payer: Medicare Other | Admitting: Neurology

## 2022-05-21 ENCOUNTER — Encounter: Payer: Self-pay | Admitting: Neurology

## 2022-05-21 VITALS — BP 168/88 | HR 80 | Ht 63.0 in | Wt 153.0 lb

## 2022-05-21 DIAGNOSIS — I63532 Cerebral infarction due to unspecified occlusion or stenosis of left posterior cerebral artery: Secondary | ICD-10-CM

## 2022-05-21 NOTE — Progress Notes (Signed)
[facility]  PATIENT: Rachel Hines DOB: 08-May-1931  REQUESTING CLINICIAN: Carol Ada, MD HISTORY FROM: Patient and daughter  REASON FOR VISIT: Left PCA stroke    HISTORICAL  CHIEF COMPLAINT:  Chief Complaint  Patient presents with   New Patient (Initial Visit)    Room 12, with daughter  f/u occipital stroke, c/o vision problems, and daily headaches      HISTORY OF PRESENT ILLNESS:  This is a 88 year old woman past medical history of hypertension, hyperlipidemia, diabetes mellitus well managed, who is presenting with her daughter Rachel Hines after being discharged from the hospital for a left PCA stroke.  Daughter reported patient was experienced episodes of disorientation, impaired vision, felt like she lost time, initially she refused to go to the hospital until 7 days later when she is having worsening visual disturbance therefore she presented to the ED.  In the ED she was found to have a subacute left PCA territory stroke.  Etiology large vessel disease versus cardioembolic.  Patient was stable, no deficit noted that require rehab.  She was discharged home with aspirin and Plavix for total of 21 days and recommended to continue with aspirin thereafter.  She was also referred to cardiology for loop recorder.  They report having an appointment tomorrow with cardiology.  Her main complaint lately is her vision, she is having trouble reading (she loves reading), but reports it is blurry on the right side but denies any frank visual field cut.     Hospital Summary  Rachel Hines is a pleasant 88 y.o. female with medical history significant for hypertension, thoracic aortic aneurysm, type 2 diabetes mellitus, and hyperlipidemia with statin intolerance, now presenting to the emergency department with 2-3 days of confusion, impaired vision, and headache. Patient admitted as above due to have acute to subacute ischemic CVA involving the left occipital lobe.  Neurology  following recommending Plavix x21 days, aspirin 81 mg ongoing as well as low-dose Crestor.  Patient does have known history of difficulty tolerating statins, we discussed every other day dosing as well if 5 mg daily is not tolerated.  Patient's other chronic comorbid conditions appear stable, A1c less than 6, blood pressure and creatinine remain at baseline.  Known history of thoracic aortic aneurysm nonoperative per CT surgery remains asymptomatic.  At this time patient is otherwise stable for discharge, follow-up outpatient with cardiology for loop recorder placement, follow-up with neurology as scheduled for ongoing evaluation and treatment as well as with PCP for routine care.  OTHER MEDICAL CONDITIONS: Hypertension, Hyperlipidemia   REVIEW OF SYSTEMS: Full 14 system review of systems performed and negative with exception of: As noted in the HPI   ALLERGIES: Allergies  Allergen Reactions   Atorvastatin Other (See Comments)    Severe muscle pain, bone pain   Other Rash and Other (See Comments)    Food dyes- Rashes and joint pain    HOME MEDICATIONS: Outpatient Medications Prior to Visit  Medication Sig Dispense Refill   acetaminophen (TYLENOL) 500 MG tablet Take 500 mg by mouth every 6 (six) hours as needed for mild pain or headache.     aspirin EC 81 MG tablet Take 1 tablet (81 mg total) by mouth daily. Swallow whole. 30 tablet 12   atenolol (TENORMIN) 100 MG tablet Take 100 mg by mouth every morning.     olmesartan (BENICAR) 40 MG tablet Take 40 mg by mouth daily.     rosuvastatin (CRESTOR) 5 MG tablet Take 1 tablet (5 mg  total) by mouth daily. 30 tablet 1   spironolactone (ALDACTONE) 25 MG tablet Take 0.5 tablets (12.5 mg total) by mouth daily. 30 tablet 0   zolpidem (AMBIEN) 10 MG tablet Take 10 mg by mouth at bedtime.     clopidogrel (PLAVIX) 75 MG tablet Take 1 tablet (75 mg total) by mouth daily. 21 tablet 0   No facility-administered medications prior to visit.    PAST  MEDICAL HISTORY: Past Medical History:  Diagnosis Date   Acute meniscal injury of left knee    Arthritis    GERD (gastroesophageal reflux disease)    H/O hiatal hernia    Heart block AV first degree    History of esophageal dilatation    Hyperlipidemia    Hypertension    Impaired hearing    Left knee pain    severe   Type 2 diabetes mellitus ([place])    Wears hearing aid     PAST SURGICAL HISTORY: Past Surgical History:  Procedure Laterality Date   ABDOMINAL HYSTERECTOMY  1960'S   CATARACT EXTRACTION W/ INTRAOCULAR LENS IMPLANT Right    CHONDROPLASTY  04/14/2013   Procedure:  ABRATION CHONDROPLASTY OF MEDIAL CHONDIAL;  Surgeon: Tobi Bastos, MD;  Location: [place];  Service: Orthopedics;;   KNEE ARTHROSCOPY WITH LATERAL MENISECTOMY Left 04/14/2013   Procedure: KNEE ARTHROSCOPY WITH LATERAL MENISECTOMY;  Surgeon: Tobi Bastos, MD;  Location: [facility];  Service: Orthopedics;  Laterality: Left;   KNEE ARTHROSCOPY WITH MEDIAL MENISECTOMY Left 04/14/2013   Procedure: LEFT KNEE ARTHROSCOPY WITH MEDIAL MENISECTOMY;  Surgeon: Tobi Bastos, MD;  Location: [facility];  Service: Orthopedics;  Laterality: Left;   TOTAL KNEE ARTHROPLASTY Right 2011    FAMILY HISTORY: History reviewed. No pertinent family history.  SOCIAL HISTORY: Social History   Socioeconomic History   Marital status: Widowed    Spouse name: Not on file   Number of children: Not on file   Years of education: Not on file   Highest education level: Not on file  Occupational History   Not on file  Tobacco Use   Smoking status: Never   Smokeless tobacco: Never  Substance and Sexual Activity   Alcohol use: No   Drug use: No   Sexual activity: Not on file  Other Topics Concern   Not on file  Social History Narrative   Not on file   Social Determinants of Health   Financial Resource Strain: Not on file  Food Insecurity: Not on file  Transportation  Needs: Not on file  Physical Activity: Not on file  Stress: Not on file  Social Connections: Not on file  Intimate Partner Violence: Not on file    PHYSICAL EXAM  GENERAL EXAM/CONSTITUTIONAL: Vitals:  Vitals:   05/21/22 0812  BP: (!) 168/88  Pulse: 80  Weight: 153 lb (69.4 kg)  Height: '5\' 3"'$  (1.6 m)   Body mass index is 27.1 kg/m. Wt Readings from Last 3 Encounters:  05/21/22 153 lb (69.4 kg)  04/26/22 155 lb 10.3 oz (70.6 kg)  12/16/21 164 lb 12.8 oz (74.8 kg)   Patient is in no distress; well developed, nourished and groomed; neck is supple  EYES: Pupils round and reactive to light, Visual fields full to confrontation, Extraocular movements intacts,   MUSCULOSKELETAL: Gait, strength, tone, movements noted in Neurologic exam below  NEUROLOGIC: MENTAL STATUS:      No data to display         awake, alert,  oriented to person, place and time recent and remote memory intact normal attention and concentration language fluent, comprehension intact, naming intact fund of knowledge appropriate  CRANIAL NERVE:  2nd, 3rd, 4th, 6th - pupils equal and reactive to light, visual fields full to confrontation, extraocular muscles intact, no nystagmus 5th - facial sensation symmetric 7th - facial strength symmetric 8th - hearing intact 9th - palate elevates symmetrically, uvula midline 11th - shoulder shrug symmetric 12th - tongue protrusion midline  MOTOR:  normal bulk and tone, full strength in the BUE, BLE  SENSORY:  normal and symmetric to light touch  COORDINATION:  finger-nose-finger, fine finger movements normal  REFLEXES:  deep tendon reflexes present and symmetric  GAIT/STATION:  normal   DIAGNOSTIC DATA (LABS, IMAGING, TESTING) - I reviewed patient records, labs, notes, testing and imaging myself where available.  Lab Results  Component Value Date   WBC 7.5 04/26/2022   HGB 12.9 04/26/2022   HCT 38.1 04/26/2022   MCV 92.0 04/26/2022   PLT 180  04/26/2022      Component Value Date/Time   NA 140 04/26/2022 0213   K 4.9 04/26/2022 0213   CL 108 04/26/2022 0213   CO2 25 04/26/2022 0213   GLUCOSE 85 04/26/2022 0213   BUN 27 (H) 04/26/2022 0213   CREATININE 1.14 (H) 04/26/2022 0213   CALCIUM 9.1 04/26/2022 0213   PROT 6.4 (L) 04/26/2022 0213   ALBUMIN 3.5 04/26/2022 0213   AST 14 (L) 04/26/2022 0213   ALT 10 04/26/2022 0213   ALKPHOS 86 04/26/2022 0213   BILITOT 0.8 04/26/2022 0213   GFRNONAA 46 (L) 04/26/2022 0213   Lab Results  Component Value Date   CHOL 154 04/26/2022   HDL 33 (L) 04/26/2022   LDLCALC 103 (H) 04/26/2022   TRIG 92 04/26/2022   CHOLHDL 4.7 04/26/2022   Lab Results  Component Value Date   HGBA1C 5.6 04/26/2022   No results found for: "VITAMINB12" No results found for: "TSH"  MRI Brain 04/26/22 1. Evolving early subacute left PCA distribution infarct involving the mesial left temporoccipital region. Minimal associated petechial blood products without hemorrhagic transformation. No significant regional mass effect. 2. No other acute intracranial abnormality. 3. Underlying mild for age chronic microvascular ischemic disease.   CTA neck: 1. The common carotid, internal carotid and vertebral arteries are patent within the neck without stenosis. Mild atherosclerotic plaque within the carotid systems within the neck, as described. 2. Aneurysmal dilation of the partially imaged ascending and descending thoracic aorta (measuring up to 4.3 cm in diameter at the imaged levels). A dedicated CT angiogram of the chest is recommended for further evaluation. 3.  Aortic Atherosclerosis (ICD10-I70.0).    CTA head: 1. Atherosclerotic irregularity of the posterior cerebral arteries without proximal occlusion or proximal high-grade stenosis. 2. No intracranial large vessel occlusion or proximal high-grade arterial stenosis elsewhere. 3. Mild atherosclerotic plaque within the intracranial internal carotid arteries  with no more than mild stenosis.  Echocardiogram  1. Left ventricular ejection fraction, by estimation, is 60 to 65%. The  left ventricle has normal function. The left ventricle has no regional  wall motion abnormalities. There is mild asymmetric left ventricular  hypertrophy of the septal segment. Left  ventricular diastolic parameters are indeterminate.  2. Right ventricular systolic function is normal. The right ventricular size is normal. Tricuspid regurgitation signal is inadequate for assessing PA pressure.  3. The mitral valve is grossly normal. Trivial mitral valve regurgitation. No evidence of mitral stenosis.  4. The  aortic valve is tricuspid. There is mild calcification of the aortic valve. There is mild thickening of the aortic valve. Aortic valve  regurgitation is mild. Aortic valve sclerosis is present, with no evidence  of aortic valve stenosis.  5. Aneurysm of the ascending aorta, measuring 50 mm.  6. The inferior vena cava is normal in size with greater than 50%  respiratory variability, suggesting right atrial pressure of 3 mmHg.    ASSESSMENT AND PLAN  88 y.o. year old female with vascular risk factor including hypertension, hyperlipidemia who is presenting after being admitted to the hospital for left PCA stroke territory stroke, stroke etiology likely large vessel versus cardioembolic.  She already completed 21 days of DAPT, aspirin and Plavix and is on aspirin alone and is pending a loop recorder.  Advised patient to continue with cardiology as scheduled, and if found to have atrial fibrillation, she most likely will be anticoagulated.  Continue with your current medication, continue to follow with PCP and return in 1 year for follow-up or sooner if worse.  They voiced understanding.   1. Acute ischemic left PCA stroke [facility])      Patient Instructions  Follow-up with cardiology as scheduled for loop recorder Continue current medications Continue with aspirin  daily Continue to follow with PCP and return in 1 year for follow-up    No orders of the defined types were placed in this encounter.   No orders of the defined types were placed in this encounter.   Return in about 6 months (around 11/19/2022).  I have spent a total of 60 minutes dedicated to this patient today, preparing to see patient, performing a medically appropriate examination and evaluation, ordering tests and/or medications and procedures, and counseling and educating the patient/family/caregiver; independently interpreting result and communicating results to the family/patient/caregiver; and documenting clinical information in the electronic medical record.   Alric Ran, MD 05/21/2022, 1:44 PM  [facility] [address], [place], [place] [address] [phone]

## 2022-05-21 NOTE — Patient Instructions (Signed)
Follow-up with cardiology as scheduled for loop recorder Continue current medications Continue with aspirin daily Continue to follow with PCP and return in 1 year for follow-up

## 2022-05-22 ENCOUNTER — Other Ambulatory Visit: Payer: Self-pay | Admitting: *Deleted

## 2022-05-22 ENCOUNTER — Encounter: Payer: Self-pay | Admitting: Internal Medicine

## 2022-05-22 ENCOUNTER — Ambulatory Visit: Payer: Medicare Other | Attending: Internal Medicine | Admitting: Internal Medicine

## 2022-05-22 VITALS — BP 130/78 | HR 59 | Ht 63.0 in | Wt 153.2 lb

## 2022-05-22 DIAGNOSIS — I7121 Aneurysm of the ascending aorta, without rupture: Secondary | ICD-10-CM

## 2022-05-22 DIAGNOSIS — I639 Cerebral infarction, unspecified: Secondary | ICD-10-CM | POA: Insufficient documentation

## 2022-05-22 DIAGNOSIS — I10 Essential (primary) hypertension: Secondary | ICD-10-CM | POA: Insufficient documentation

## 2022-05-22 NOTE — Progress Notes (Signed)
HPI Rachel Hines is referred by Dr. April Manson for evaluation of a cryptogenic stroke. The patient has a h/o HTN, and dyslipidemia and sustained a stroke several weeks ago. She has done well with essentially complete recovery. The etiology of her stroke is unknown. She denies a h/o atrial fib. No syncope.  Allergies  Allergen Reactions   Atorvastatin Other (See Comments)    Severe muscle pain, bone pain   Other Rash and Other (See Comments)    Food dyes- Rashes and joint pain     Current Outpatient Medications  Medication Sig Dispense Refill   acetaminophen (TYLENOL) 500 MG tablet Take 500 mg by mouth every 6 (six) hours as needed for mild pain or headache.     aspirin EC 81 MG tablet Take 1 tablet (81 mg total) by mouth daily. Swallow whole. 30 tablet 12   atenolol (TENORMIN) 100 MG tablet Take 100 mg by mouth every morning.     olmesartan (BENICAR) 40 MG tablet Take 40 mg by mouth daily.     rosuvastatin (CRESTOR) 5 MG tablet Take 1 tablet (5 mg total) by mouth daily. 30 tablet 1   spironolactone (ALDACTONE) 25 MG tablet Take 0.5 tablets (12.5 mg total) by mouth daily. 30 tablet 0   zolpidem (AMBIEN) 10 MG tablet Take 10 mg by mouth at bedtime.     No current facility-administered medications for this visit.     Past Medical History:  Diagnosis Date   Acute meniscal injury of left knee    Arthritis    GERD (gastroesophageal reflux disease)    H/O hiatal hernia    Heart block AV first degree    History of esophageal dilatation    Hyperlipidemia    Hypertension    Impaired hearing    Left knee pain    severe   Type 2 diabetes mellitus ([place])    Wears hearing aid     ROS:   All systems reviewed and negative except as noted in the HPI.   Past Surgical History:  Procedure Laterality Date   ABDOMINAL HYSTERECTOMY  1960'S   CATARACT EXTRACTION W/ INTRAOCULAR LENS IMPLANT Right    CHONDROPLASTY  04/14/2013   Procedure:  ABRATION CHONDROPLASTY OF MEDIAL CHONDIAL;   Surgeon: Tobi Bastos, MD;  Location: [facility];  Service: Orthopedics;;   KNEE ARTHROSCOPY WITH LATERAL MENISECTOMY Left 04/14/2013   Procedure: KNEE ARTHROSCOPY WITH LATERAL MENISECTOMY;  Surgeon: Tobi Bastos, MD;  Location: [place];  Service: Orthopedics;  Laterality: Left;   KNEE ARTHROSCOPY WITH MEDIAL MENISECTOMY Left 04/14/2013   Procedure: LEFT KNEE ARTHROSCOPY WITH MEDIAL MENISECTOMY;  Surgeon: Tobi Bastos, MD;  Location: [place];  Service: Orthopedics;  Laterality: Left;   TOTAL KNEE ARTHROPLASTY Right 2011     History reviewed. No pertinent family history.   Social History   Socioeconomic History   Marital status: Widowed    Spouse name: Not on file   Number of children: Not on file   Years of education: Not on file   Highest education level: Not on file  Occupational History   Not on file  Tobacco Use   Smoking status: Never   Smokeless tobacco: Never  Substance and Sexual Activity   Alcohol use: No   Drug use: No   Sexual activity: Not on file  Other Topics Concern   Not on file  Social History Narrative   Not on file   Social  Determinants of Health   Financial Resource Strain: Not on file  Food Insecurity: Not on file  Transportation Needs: Not on file  Physical Activity: Not on file  Stress: Not on file  Social Connections: Not on file  Intimate Partner Violence: Not on file     BP 130/78   Pulse (!) 59   Ht '5\' 3"'$  (1.6 m)   Wt 153 lb 3.2 oz (69.5 kg)   SpO2 98%   BMI 27.14 kg/m   Physical Exam:  Well appearing NAD HEENT: Unremarkable Neck:  No JVD, no thyromegally Lymphatics:  No adenopathy Back:  No CVA tenderness Lungs:  Clear HEART:  Regular rate rhythm, no murmurs, no rubs, no clicks Abd:  soft, positive bowel sounds, no organomegally, no rebound, no guarding Ext:  2 plus pulses, no edema, no cyanosis, no clubbing Skin:  No rashes no nodules Neuro:  CN II through XII  intact, motor grossly intact  EKG - reviewed  Assess/Plan:  Cryptogenic stroke - I discussed the treatment options and recommended insertion of an ILR. The indications/risks/benefits/goals/expectations of ILR insertion and she wishes to proceed. HTN - her bp is controlled.   EP Procedure Note  Preoperative diagnosis: cryptogenic stroke.  Postoperative diagnosis: same as preop diagnosis  Procedure Performed: ILR insertion  Description of the procedure: after informed consent was obtained, the patient was prepped and draped in  sterile fashion. A one cm incision was carried out. The medtronic ILR, # was inserted. The R waves measured 0.4 mV. Benzoin and steristrips were painted on the skin. A bandage was applied.   Complications: none immediately  Conclusion: successful ILR insertion.   Carleene Overlie Dawne Casali,MD   gre

## 2022-05-22 NOTE — Patient Instructions (Addendum)
Medication Instructions:  Your physician recommends that you continue on your current medications as directed. Please refer to the Current Medication list given to you today.  Labwork: None ordered.  Testing/Procedures: None ordered.  Follow-Up:   Your physician wants you to follow-up in: 6 Months with Dr. Cristopher Peru.  You will receive a reminder letter in the mail two months in advance. If you don't receive a letter, please call our office to schedule the follow-up appointment.    Implantable Loop Recorder Placement, Care After This sheet gives you information about how to care for yourself after your procedure. Your health care provider may also give you more specific instructions. If you have problems or questions, contact your health care provider. What can I expect after the procedure? After the procedure, it is common to have: Soreness or discomfort near the incision. Some swelling or bruising near the incision.  Follow these instructions at home: Incision care  Monitor your cardiac device site for redness, swelling, and drainage. Call the device clinic at [phone] if you experience these symptoms or fever/chills.  Keep the large square bandage on your site for 24 hours and then you may remove it yourself. Keep the steri-strips underneath in place.   You may shower after 72 hours / 3 days from your procedure with the steri-strips in place. They will usually fall off on their own, or may be removed after 10 days. Pat dry.   Avoid lotions, ointments, or perfumes over your incision until it is well-healed.  Please do not submerge in water until your site is completely healed.   Your device is MRI compatible.   Remote monitoring is used to monitor your cardiac device from home. This monitoring is scheduled every month by our office. It allows [place] to keep an eye on the function of your device to ensure it is working properly.  If your wound site starts to bleed apply  pressure.    For help with the monitor please call Medtronic Monitor Support Specialist directly at [phone].    If you have any questions/concerns please call the device clinic at [phone].  Activity  Return to your normal activities.  General instructions Follow instructions from your health care provider about how to manage your implantable loop recorder and transmit the information. Learn how to activate a recording if this is necessary for your type of device. You may go through a metal detection gate, and you may let someone hold a metal detector over your chest. Show your ID card if needed. Do not have an MRI unless you check with your health care provider first. Take over-the-counter and prescription medicines only as told by your health care provider. Keep all follow-up visits as told by your health care provider. This is important. Contact a health care provider if: You have redness, swelling, or pain around your incision. You have a fever. You have pain that is not relieved by your pain medicine. You have triggered your device because of fainting (syncope) or because of a heartbeat that feels like it is racing, slow, fluttering, or skipping (palpitations). Get help right away if you have: Chest pain. Difficulty breathing. Summary After the procedure, it is common to have soreness or discomfort near the incision. Change your dressing as told by your health care provider. Follow instructions from your health care provider about how to manage your implantable loop recorder and transmit the information. Keep all follow-up visits as told by your health care provider. This is important.  This information is not intended to replace advice given to you by your health care provider. Make sure you discuss any questions you have with your health care provider. Document Released: 08/13/2015 Document Revised: 10/17/2017 Document Reviewed: 10/17/2017 Elsevier Patient Education   2020 [employer].

## 2022-06-16 ENCOUNTER — Encounter: Payer: Medicare Other | Admitting: Cardiothoracic Surgery

## 2022-06-16 ENCOUNTER — Other Ambulatory Visit: Payer: Medicare Other

## 2022-06-25 ENCOUNTER — Telehealth: Payer: Self-pay

## 2022-06-25 NOTE — Telephone Encounter (Signed)
LVM for patient to call device clinic back regarding AF on LINQ  ILR alert summary report received. Battery status OK. Normal device function. No new symptom, , brady, or pause episodes.  Monthly summary reports and ROV/PRN 54 AF/flutter events, longest duration 18hrs, controlled rates overall, burden 4.8%, no OAC, ASA only 1 33sec tachy event, narrow regular rhythm HR 146

## 2022-06-25 NOTE — Telephone Encounter (Signed)
Patient agreeable to appointment with Oda Kilts on  06/26/22 to discuss OAC/ no AF clinic appointments within the next week.

## 2022-06-25 NOTE — Progress Notes (Signed)
Electrophysiology Office Note Date: 06/26/2022  ID:  IDELL HISSONG, DOB 12-15-1930, MRN [id number]  PCP: Carol Ada, MD Primary Cardiologist: None Electrophysiologist: Cristopher Peru, MD   CC: ILR follow-up  Yenni AIKO BELKO is a 88 y.o. female seen today for Dr. Lovena Le . she presents today for acute visit due to AF being discovered on her ILR which was placed for CVA.    Patient reports feeling OK overall. Denies feeling of palpitations or irregular HR.  she denies chest pain, palpitations, dyspnea, PND, orthopnea, nausea, vomiting, dizziness, syncope, edema, weight gain, or early satiety.   Device History: Medtronic loop recorder implanted 05/2022 for Cryptogenic Stroke  Past Medical History:  Diagnosis Date   Acute meniscal injury of left knee    Arthritis    GERD (gastroesophageal reflux disease)    H/O hiatal hernia    Heart block AV first degree    History of esophageal dilatation    Hyperlipidemia    Hypertension    Impaired hearing    Left knee pain    severe   Type 2 diabetes mellitus ([place])    Wears hearing aid    Past Surgical History:  Procedure Laterality Date   ABDOMINAL HYSTERECTOMY  1960'S   CATARACT EXTRACTION W/ INTRAOCULAR LENS IMPLANT Right    CHONDROPLASTY  04/14/2013   Procedure:  ABRATION CHONDROPLASTY OF MEDIAL CHONDIAL;  Surgeon: Tobi Bastos, MD;  Location: [place];  Service: Orthopedics;;   KNEE ARTHROSCOPY WITH LATERAL MENISECTOMY Left 04/14/2013   Procedure: KNEE ARTHROSCOPY WITH LATERAL MENISECTOMY;  Surgeon: Tobi Bastos, MD;  Location: [place];  Service: Orthopedics;  Laterality: Left;   KNEE ARTHROSCOPY WITH MEDIAL MENISECTOMY Left 04/14/2013   Procedure: LEFT KNEE ARTHROSCOPY WITH MEDIAL MENISECTOMY;  Surgeon: Tobi Bastos, MD;  Location: [place];  Service: Orthopedics;  Laterality: Left;   TOTAL KNEE ARTHROPLASTY Right 2011    Current Outpatient Medications   Medication Sig Dispense Refill   acetaminophen (TYLENOL) 500 MG tablet Take 500 mg by mouth every 6 (six) hours as needed for mild pain or headache.     aspirin EC 81 MG tablet Take 1 tablet (81 mg total) by mouth daily. Swallow whole. 30 tablet 12   atenolol (TENORMIN) 100 MG tablet Take 100 mg by mouth every morning.     olmesartan (BENICAR) 40 MG tablet Take 40 mg by mouth daily.     rosuvastatin (CRESTOR) 5 MG tablet Take 1 tablet (5 mg total) by mouth daily. 30 tablet 1   spironolactone (ALDACTONE) 25 MG tablet Take 0.5 tablets (12.5 mg total) by mouth daily. 30 tablet 0   zolpidem (AMBIEN) 10 MG tablet Take 10 mg by mouth at bedtime.     No current facility-administered medications for this visit.    Allergies:   Atorvastatin and Other   Social History: Social History   Socioeconomic History   Marital status: Widowed    Spouse name: Not on file   Number of children: Not on file   Years of education: Not on file   Highest education level: Not on file  Occupational History   Not on file  Tobacco Use   Smoking status: Never   Smokeless tobacco: Never  Substance and Sexual Activity   Alcohol use: No   Drug use: No   Sexual activity: Not on file  Other Topics Concern   Not on file  Social History Narrative   Not on file  Social Determinants of Health   Financial Resource Strain: Not on file  Food Insecurity: Not on file  Transportation Needs: Not on file  Physical Activity: Not on file  Stress: Not on file  Social Connections: Not on file  Intimate Partner Violence: Not on file    Family History: History reviewed. No pertinent family history.   Review of Systems: All other systems reviewed and are otherwise negative except as noted above.  Physical Exam: Vitals:   06/26/22 0937  BP: 128/72  Pulse: (!) 51  SpO2: 97%  Weight: 154 lb (69.9 kg)  Height: '5\' 3"'$  (1.6 m)     GEN- The patient is well appearing, alert and oriented x 3 today.   HEENT:  normocephalic, atraumatic; sclera clear, conjunctiva pink; hearing intact; oropharynx clear; neck supple  Lungs- Clear to ausculation bilaterally, normal work of breathing.  No wheezes, rales, rhonchi Heart- Regular rate and rhythm, no murmurs, rubs or gallops  GI- soft, non-tender, non-distended, bowel sounds present  Extremities- no clubbing, cyanosis, or edema  MS- no significant deformity or atrophy Skin- warm and dry, no rash or lesion; ILR pocket well healed Psych- euthymic mood, full affect Neuro- strength and sensation are intact  PPM Interrogation- reviewed in detail today,  See PACEART report  EKG:  EKG is not ordered today. Recent Labs: 04/26/2022: ALT 10; BUN 27; Creatinine, Ser 1.14; Hemoglobin 12.9; Platelets 180; Potassium 4.9; Sodium 140   Wt Readings from Last 3 Encounters:  06/26/22 154 lb (69.9 kg)  05/22/22 153 lb 3.2 oz (69.5 kg)  05/21/22 153 lb (69.4 kg)     Other studies Reviewed: Additional studies/ records that were reviewed today include: Previous EP office notes, Previous remote checks, Most recent labwork.   Assessment and Plan:  1. Cryptogenic Stroke s/p Medtronic Loop recorder Normal device function See Pace Art report Brady/Pause detection turned on.   2. Paroxysmal atrial fibrillation Burden 4.6% by device, Longest episode ~18 hrs Cr 0.9 - 1.1 CHA2DS2/VASc is at least 6 (Age 18, CVA, Female, HTN) She has sinus bradycardia at baseline. Will follow rates and turn brady/pause detection on.  Would only cautiously add BB if she continues to more or symptomatic AF.    Current medicines are reviewed at length with the patient today.   The patient does not have concerns regarding her medicines.  The following changes were made today:  Stop ASA. Start Eliquis  Labs/ tests ordered today include:  No orders of the defined types were placed in this encounter.   Disposition:   Follow up with EP APP  in [address]   Gerrianne Scale   06/26/2022 9:46 AM  [facility] [address] [place] [address] [phone] (office) [phone] (fax)

## 2022-06-26 ENCOUNTER — Ambulatory Visit: Payer: Medicare Other | Attending: Student | Admitting: Student

## 2022-06-26 ENCOUNTER — Encounter: Payer: Self-pay | Admitting: Student

## 2022-06-26 VITALS — BP 128/72 | HR 51 | Ht 63.0 in | Wt 154.0 lb

## 2022-06-26 DIAGNOSIS — I480 Paroxysmal atrial fibrillation: Secondary | ICD-10-CM | POA: Diagnosis not present

## 2022-06-26 DIAGNOSIS — I10 Essential (primary) hypertension: Secondary | ICD-10-CM | POA: Diagnosis not present

## 2022-06-26 DIAGNOSIS — I639 Cerebral infarction, unspecified: Secondary | ICD-10-CM | POA: Insufficient documentation

## 2022-06-26 MED ORDER — APIXABAN 5 MG PO TABS: 5.0000 mg | ORAL_TABLET | Freq: Two times a day (BID) | ORAL | 3 refills | Status: DC

## 2022-06-26 MED ORDER — ROSUVASTATIN CALCIUM 5 MG PO TABS: 5.0000 mg | ORAL_TABLET | Freq: Every day | ORAL | 3 refills | Status: DC

## 2022-06-26 NOTE — Patient Instructions (Signed)
Medication Instructions:  Your physician has recommended you make the following change in your medication:   DISCONTINUE: Aspirin START: Eliquis '5mg'$  twice daily  *If you need a refill on your cardiac medications before your next appointment, please call your pharmacy*   Lab Work: TODAY: BMET, CBC  If you have labs (blood work) drawn today and your tests are completely normal, you will receive your results only by: [place] (if you have MyChart) OR A paper copy in the mail If you have any lab test that is abnormal or we need to change your treatment, we will call you to review the results.   Follow-Up: At [facility], you and your health needs are our priority.  As part of our continuing mission to provide you with exceptional heart care, we have created designated Provider Care Teams.  These Care Teams include your primary Cardiologist (physician) and Advanced Practice Providers (APPs -  Physician Assistants and Nurse Practitioners) who all work together to provide you with the care you need, when you need it.  We recommend signing up for the patient portal called "MyChart".  Sign up information is provided on this After Visit Summary.  MyChart is used to connect with patients for Virtual Visits (Telemedicine).  Patients are able to view lab/test results, encounter notes, upcoming appointments, etc.  Non-urgent messages can be sent to your provider as well.   To learn more about what you can do with MyChart, go to [email or web address].    Your next appointment:   As scheduled  Other Instructions Apixaban Tablets What is this medication? APIXABAN (a PIX a ban) prevents or treats blood clots. It is also used to lower the risk of stroke in people with AFib (atrial fibrillation). It belongs to a group of medications called blood thinners. This medicine may be used for other purposes; ask your health care provider or pharmacist if you have questions. COMMON BRAND  NAME(S): Eliquis What should I tell my care team before I take this medication? They need to know if you have any of these conditions: Antiphospholipid antibody syndrome Bleeding disorder History of bleeding in the brain History of blood clots History of stomach bleeding Kidney disease Liver disease Mechanical heart valve Spinal surgery An unusual or allergic reaction to apixaban, other medications, foods, dyes, or preservatives Pregnant or trying to get pregnant Breast-feeding How should I use this medication? Take this medication by mouth. For your therapy to work as well as possible, take each dose exactly as prescribed on the prescription label. Do not skip doses. Skipping doses or stopping this medication can increase your risk of a blood clot or stroke. Keep taking this medication unless your care team tells you to stop. Take it as directed on the prescription label at the same time every day. You can take it with or without food. If it upsets your stomach, take it with food. A special MedGuide will be given to you by the pharmacist with each prescription and refill. Be sure to read this information carefully each time. Talk to your care team about the use of this medication in children. Special care may be needed. Overdosage: If you think you have taken too much of this medicine contact a poison control center or emergency room at once. NOTE: This medicine is only for you. Do not share this medicine with others. What if I miss a dose? If you miss a dose, take it as soon as you can. If it is almost time for  your next dose, take only that dose. Do not take double or extra doses. What may interact with this medication? This medication may interact with the following: Aspirin and aspirin-like medications Certain medications for fungal infections like itraconazole and ketoconazole Certain medications for seizures like carbamazepine and phenytoin Certain medications for blood clots like  enoxaparin, dalteparin, heparin, and warfarin Clarithromycin NSAIDs, medications for pain and inflammation, like ibuprofen or naproxen Rifampin Ritonavir St. John's wort This list may not describe all possible interactions. Give your health care provider a list of all the medicines, herbs, non-prescription drugs, or dietary supplements you use. Also tell them if you smoke, drink alcohol, or use illegal drugs. Some items may interact with your medicine. What should I watch for while using this medication? Visit your healthcare professional for regular checks on your progress. You may need blood work done while you are taking this medication. Your condition will be monitored carefully while you are receiving this medication. It is important not to miss any appointments. Avoid sports and activities that might cause injury while you are using this medication. Severe falls or injuries can cause unseen bleeding. Be careful when using sharp tools or knives. Consider using an Copy. Take special care brushing or flossing your teeth. Report any injuries, bruising, or red spots on the skin to your healthcare professional. If you are going to need surgery or other procedure, tell your healthcare professional that you are taking this medication. Wear a medical ID bracelet or chain. Carry a card that describes your disease and details of your medication and dosage times. What side effects may I notice from receiving this medication? Side effects that you should report to your care team as soon as possible: Allergic reactions--skin rash, itching, hives, swelling of the face, lips, tongue, or throat Bleeding--bloody or black, tar-like stools, vomiting blood or brown material that looks like coffee grounds, red or dark brown urine, small red or purple spots on the skin, unusual bruising or bleeding Bleeding in the brain--severe headache, stiff neck, confusion, dizziness, change in vision, numbness or  weakness of the face, arm, or leg, trouble speaking, trouble walking, vomiting Heavy periods This list may not describe all possible side effects. Call your doctor for medical advice about side effects. You may report side effects to FDA at 1-800-FDA-1088. Where should I keep my medication? Keep out of the reach of children and pets. Store at room temperature between 20 and 25 degrees C (68 and 77 degrees F). Get rid of any unused medication after the expiration date. To get rid of medications that are no longer needed or expired: Take the medication to a medication take-back program. Check with your pharmacy or law enforcement to find a location. If you cannot return the medication, check the label or package insert to see if the medication should be thrown out in the garbage or flushed down the toilet. If you are not sure, ask your care team. If it is safe to put in the trash, empty the medication out of the container. Mix the medication with cat litter, dirt, coffee grounds, or other unwanted substance. Seal the mixture in a bag or container. Put it in the trash. NOTE: This sheet is a summary. It may not cover all possible information. If you have questions about this medicine, talk to your doctor, pharmacist, or health care provider.  2023 Elsevier/Gold Standard (2020-09-28 00:00:00)

## 2022-06-27 LAB — BASIC METABOLIC PANEL
BUN/Creatinine Ratio: 15 (ref 12–28)
BUN: 15 mg/dL (ref 8–27)
CO2: 26 mmol/L (ref 20–29)
Calcium: 9.3 mg/dL (ref 8.7–10.3)
Chloride: 105 mmol/L (ref 96–106)
Creatinine, Ser: 1.03 mg/dL — ABNORMAL HIGH (ref 0.57–1.00)
Glucose: 89 mg/dL (ref 70–99)
Potassium: 5.7 mmol/L — ABNORMAL HIGH (ref 3.5–5.2)
Sodium: 141 mmol/L (ref 134–144)
eGFR: 52 mL/min/{1.73_m2} — ABNORMAL LOW (ref >59)

## 2022-06-27 LAB — CBC
Hematocrit: 38.7 % (ref 34.0–46.6)
Hemoglobin: 12.7 g/dL (ref 11.1–15.9)
MCH: 30.7 pg (ref 26.6–33.0)
MCHC: 32.8 g/dL (ref 31.5–35.7)
MCV: 94 fL (ref 79–97)
Platelets: 168 10*3/uL (ref 150–450)
RBC: 4.14 x10E6/uL (ref 3.77–5.28)
RDW: 11.9 % (ref 11.7–15.4)
WBC: 7.1 10*3/uL (ref 3.4–10.8)

## 2022-06-27 NOTE — Addendum Note (Signed)
Addended by: Carylon Perches on: 06/27/2022 04:46 PM   Modules accepted: Orders

## 2022-07-01 ENCOUNTER — Other Ambulatory Visit: Payer: Medicare Other

## 2022-07-02 DIAGNOSIS — I700 Atherosclerosis of aorta: Secondary | ICD-10-CM | POA: Diagnosis not present

## 2022-07-02 DIAGNOSIS — I679 Cerebrovascular disease, unspecified: Secondary | ICD-10-CM | POA: Diagnosis not present

## 2022-07-02 DIAGNOSIS — Z23 Encounter for immunization: Secondary | ICD-10-CM | POA: Diagnosis not present

## 2022-07-02 DIAGNOSIS — E1169 Type 2 diabetes mellitus with other specified complication: Secondary | ICD-10-CM | POA: Diagnosis not present

## 2022-07-02 DIAGNOSIS — I10 Essential (primary) hypertension: Secondary | ICD-10-CM | POA: Diagnosis not present

## 2022-07-02 DIAGNOSIS — E7800 Pure hypercholesterolemia, unspecified: Secondary | ICD-10-CM | POA: Diagnosis not present

## 2022-07-21 ENCOUNTER — Telehealth: Payer: Self-pay

## 2022-07-21 NOTE — Telephone Encounter (Signed)
[employer] Alert.  ILR alert summary report received. Battery status OK. Normal device function. No new symptom, tachy, episodes. 5 new AF episodes, longest duration 1hr 55mn, burden 3.2%, Eliquis. 6 brady events, 1 pause, 3sec in duration 11/5 @ 16:26, route to triage.  Spoke to patients daughter PJeannene Patella advised her mother is not with her at this time but she will check and see how patient felt/symptoms during time frame on 07/19/22 - 07/20/22. Direct phone number left.     Pause ~ Patient was awake during this time and asymptomatic. Patient was watching TV.

## 2022-07-22 NOTE — Telephone Encounter (Signed)
Pt daughter returning nurse call.

## 2022-07-22 NOTE — Telephone Encounter (Signed)
Returned call to patients daughter Jeannene Patella, advised patient denies any symptoms and has been doing well. Advised I will forward to Dr. Lovena Le for review and we will call if any changes are warranted. Voiced understanding and appreciative of call.

## 2022-07-23 ENCOUNTER — Ambulatory Visit: Payer: Medicare Other | Admitting: Student

## 2022-07-23 NOTE — Telephone Encounter (Signed)
No change in treatment

## 2022-07-28 ENCOUNTER — Ambulatory Visit (INDEPENDENT_AMBULATORY_CARE_PROVIDER_SITE_OTHER): Payer: Medicare Other

## 2022-07-28 DIAGNOSIS — I639 Cerebral infarction, unspecified: Secondary | ICD-10-CM

## 2022-07-29 LAB — CUP PACEART REMOTE DEVICE CHECK
Date Time Interrogation Session: 20231112230132
Implantable Pulse Generator Implant Date: 20230907

## 2022-08-12 NOTE — Progress Notes (Signed)
Electrophysiology Office Note Date: 08/20/2022  ID:  Rachel Hines, DOB 1930-12-01, MRN [id number]  PCP: Carol Ada, MD Primary Cardiologist: None Electrophysiologist: Cristopher Peru, MD   CC: ILR follow-up  Rachel Hines is a 88 y.o. female seen today for Dr. Lovena Le . she presents today for routine electrophysiology followup. Since last being seen in our clinic the patient reports doing well overall.  she denies chest pain, palpitations, dyspnea, PND, orthopnea, nausea, vomiting, dizziness, syncope, edema, weight gain, or early satiety.   Device History: Medtronic loop recorder implanted 05/2022 for Cryptogenic Stroke   Past Medical History:  Diagnosis Date   Acute meniscal injury of left knee    Arthritis    GERD (gastroesophageal reflux disease)    H/O hiatal hernia    Heart block AV first degree    History of esophageal dilatation    Hyperlipidemia    Hypertension    Impaired hearing    Left knee pain    severe   Type 2 diabetes mellitus ([place])    Wears hearing aid    Past Surgical History:  Procedure Laterality Date   ABDOMINAL HYSTERECTOMY  1960'S   CATARACT EXTRACTION W/ INTRAOCULAR LENS IMPLANT Right    CHONDROPLASTY  04/14/2013   Procedure:  ABRATION CHONDROPLASTY OF MEDIAL CHONDIAL;  Surgeon: Tobi Bastos, MD;  Location: [facility];  Service: Orthopedics;;   KNEE ARTHROSCOPY WITH LATERAL MENISECTOMY Left 04/14/2013   Procedure: KNEE ARTHROSCOPY WITH LATERAL MENISECTOMY;  Surgeon: Tobi Bastos, MD;  Location: [facility];  Service: Orthopedics;  Laterality: Left;   KNEE ARTHROSCOPY WITH MEDIAL MENISECTOMY Left 04/14/2013   Procedure: LEFT KNEE ARTHROSCOPY WITH MEDIAL MENISECTOMY;  Surgeon: Tobi Bastos, MD;  Location: [facility];  Service: Orthopedics;  Laterality: Left;   TOTAL KNEE ARTHROPLASTY Right 2011    Current Outpatient Medications  Medication Sig Dispense Refill   acetaminophen (TYLENOL)  500 MG tablet Take 500 mg by mouth every 6 (six) hours as needed for mild pain or headache.     apixaban (ELIQUIS) 5 MG TABS tablet Take 1 tablet (5 mg total) by mouth 2 (two) times daily. 180 tablet 3   olmesartan (BENICAR) 40 MG tablet Take 40 mg by mouth daily.     rosuvastatin (CRESTOR) 5 MG tablet Take 1 tablet (5 mg total) by mouth daily. 90 tablet 3   zolpidem (AMBIEN) 10 MG tablet Take 10 mg by mouth at bedtime.     atenolol (TENORMIN) 50 MG tablet Take 1 tablet (50 mg total) by mouth every morning. 90 tablet 3   No current facility-administered medications for this visit.    Allergies:   Atorvastatin and Other   Social History: Social History   Socioeconomic History   Marital status: Widowed    Spouse name: Not on file   Number of children: Not on file   Years of education: Not on file   Highest education level: Not on file  Occupational History   Not on file  Tobacco Use   Smoking status: Never   Smokeless tobacco: Never  Substance and Sexual Activity   Alcohol use: No   Drug use: No   Sexual activity: Not on file  Other Topics Concern   Not on file  Social History Narrative   Not on file   Social Determinants of Health   Financial Resource Strain: Not on file  Food Insecurity: Not on file  Transportation Needs: Not on file  Physical Activity: Not on file  Stress: Not on file  Social Connections: Not on file  Intimate Partner Violence: Not on file    Family History: History reviewed. No pertinent family history.   Review of Systems: All other systems reviewed and are otherwise negative except as noted above.  Physical Exam: Vitals:   08/20/22 0800  BP: 116/82  Pulse: (!) 46  SpO2: 96%  Weight: 153 lb 6.4 oz (69.6 kg)  Height: '5\' 4"'$  (1.626 m)     GEN- The patient is well appearing, alert and oriented x 3 today.   HEENT: normocephalic, atraumatic; sclera clear, conjunctiva pink; hearing intact; oropharynx clear; neck supple  Lungs- Clear to  ausculation bilaterally, normal work of breathing.  No wheezes, rales, rhonchi Heart- Regular rate and rhythm, no murmurs, rubs or gallops  GI- soft, non-tender, non-distended, bowel sounds present  Extremities- no clubbing, cyanosis, or edema  MS- no significant deformity or atrophy Skin- warm and dry, no rash or lesion; ILR pocket well healed Psych- euthymic mood, full affect Neuro- strength and sensation are intact  PPM Interrogation- reviewed in detail today,  See PACEART report  EKG:  EKG is not ordered today.  Recent Labs: 04/26/2022: ALT 10 06/26/2022: BUN 15; Creatinine, Ser 1.03; Hemoglobin 12.7; Platelets 168; Potassium 5.7; Sodium 141   Wt Readings from Last 3 Encounters:  08/20/22 153 lb 6.4 oz (69.6 kg)  06/26/22 154 lb (69.9 kg)  05/22/22 153 lb 3.2 oz (69.5 kg)     Other studies Reviewed: Additional studies/ records that were reviewed today include: Previous EP office notes, Previous remote checks, Most recent labwork.   Assessment and Plan:  1. Cryptogenic Stroke s/p Medtronic Loop recorder Normal device function See Pace Art report No changes today  2. Paroxysmal AF Burden 3.1% by device. Longest episode [id number]  is at least 6 She has sinus bradycardia at baseline. Brady and pause detection on.  Continue Eliquis. Labs today. Adjusted tachy to >24 beats  3. Bradycardia Asymptomatic. Decrease atenolol to 50 mg daily, further as needed.  Pause detection increased to 4 secs.   Current medicines are reviewed at length with the patient today.    Labs/ tests ordered today include:  Orders Placed This Encounter  Procedures   Basic metabolic panel   CBC    Disposition:   Follow up with Dr. TLovena Le in 6 Months   Signed, [name] 08/20/2022 8:29 AM  [facility] [address] [phone](office) [phone](fax)

## 2022-08-20 ENCOUNTER — Ambulatory Visit: Payer: Medicare Other | Attending: Student | Admitting: Student

## 2022-08-20 ENCOUNTER — Encounter: Payer: Self-pay | Admitting: Student

## 2022-08-20 VITALS — BP 116/82 | HR 46 | Ht 64.0 in | Wt 153.4 lb

## 2022-08-20 DIAGNOSIS — R001 Bradycardia, unspecified: Secondary | ICD-10-CM | POA: Insufficient documentation

## 2022-08-20 DIAGNOSIS — I639 Cerebral infarction, unspecified: Secondary | ICD-10-CM | POA: Insufficient documentation

## 2022-08-20 DIAGNOSIS — I10 Essential (primary) hypertension: Secondary | ICD-10-CM | POA: Diagnosis not present

## 2022-08-20 DIAGNOSIS — I480 Paroxysmal atrial fibrillation: Secondary | ICD-10-CM | POA: Diagnosis not present

## 2022-08-20 MED ORDER — ATENOLOL 50 MG PO TABS: 50.0000 mg | ORAL_TABLET | Freq: Every morning | ORAL | 3 refills | Status: DC

## 2022-08-20 NOTE — Patient Instructions (Signed)
Medication Instructions:  Your physician has recommended you make the following change in your medication:   DECREASE: Atenolol to '50mg'$  daily  *If you need a refill on your cardiac medications before your next appointment, please call your pharmacy*   Lab Work: TODAY: BMET, CBC  If you have labs (blood work) drawn today and your tests are completely normal, you will receive your results only by: [place] (if you have MyChart) OR A paper copy in the mail If you have any lab test that is abnormal or we need to change your treatment, we will call you to review the results.   Follow-Up: At [facility], you and your health needs are our priority.  As part of our continuing mission to provide you with exceptional heart care, we have created designated Provider Care Teams.  These Care Teams include your primary Cardiologist (physician) and Advanced Practice Providers (APPs -  Physician Assistants and Nurse Practitioners) who all work together to provide you with the care you need, when you need it.   Your next appointment:   6 month(s)  The format for your next appointment:   In Person  Provider:   Cristopher Peru, MD    Important Information About Sugar

## 2022-08-21 LAB — BASIC METABOLIC PANEL
BUN/Creatinine Ratio: 22 (ref 12–28)
BUN: 22 mg/dL (ref 8–27)
CO2: 25 mmol/L (ref 20–29)
Calcium: 9.1 mg/dL (ref 8.7–10.3)
Chloride: 104 mmol/L (ref 96–106)
Creatinine, Ser: 1.02 mg/dL — ABNORMAL HIGH (ref 0.57–1.00)
Glucose: 96 mg/dL (ref 70–99)
Potassium: 5 mmol/L (ref 3.5–5.2)
Sodium: 141 mmol/L (ref 134–144)
eGFR: 53 mL/min/{1.73_m2} — ABNORMAL LOW (ref >59)

## 2022-08-21 LAB — CBC
Hematocrit: 39.6 % (ref 34.0–46.6)
Hemoglobin: 13 g/dL (ref 11.1–15.9)
MCH: 30.7 pg (ref 26.6–33.0)
MCHC: 32.8 g/dL (ref 31.5–35.7)
MCV: 93 fL (ref 79–97)
Platelets: 180 10*3/uL (ref 150–450)
RBC: 4.24 x10E6/uL (ref 3.77–5.28)
RDW: 12 % (ref 11.7–15.4)
WBC: 6.8 10*3/uL (ref 3.4–10.8)

## 2022-09-01 ENCOUNTER — Ambulatory Visit (INDEPENDENT_AMBULATORY_CARE_PROVIDER_SITE_OTHER): Payer: Medicare Other

## 2022-09-01 DIAGNOSIS — I639 Cerebral infarction, unspecified: Secondary | ICD-10-CM

## 2022-09-02 LAB — CUP PACEART REMOTE DEVICE CHECK
Date Time Interrogation Session: 20231217230412
Implantable Pulse Generator Implant Date: 20230907

## 2022-09-09 NOTE — Progress Notes (Signed)
Carelink Summary Report / Loop Recorder 

## 2022-10-06 ENCOUNTER — Ambulatory Visit: Payer: Medicare Other | Attending: Cardiology

## 2022-10-06 DIAGNOSIS — I639 Cerebral infarction, unspecified: Secondary | ICD-10-CM | POA: Diagnosis not present

## 2022-10-07 LAB — CUP PACEART REMOTE DEVICE CHECK
Date Time Interrogation Session: 20240119230424
Implantable Pulse Generator Implant Date: 20230907

## 2022-10-07 NOTE — Progress Notes (Signed)
Carelink Summary Report / Loop Recorder

## 2022-10-27 DIAGNOSIS — B029 Zoster without complications: Secondary | ICD-10-CM | POA: Diagnosis not present

## 2022-11-04 ENCOUNTER — Telehealth: Payer: Self-pay

## 2022-11-04 NOTE — Telephone Encounter (Signed)
Spoke with A. Tillery, PA-C. Patient needs follow up in clinic in 3-4 weeks to re-assess af burden and rate control once her acute shingles phase has calmed.   Made daughter aware.  Appt made with Jonni Sanger for 12/03/22.  They have number to device clinic if needed.

## 2022-11-04 NOTE — Telephone Encounter (Signed)
ILR alert report received. Battery status OK. Normal device function. No new symptom, brady, or pause episodes.   There were 3 tachycardia episode that were 158-167 bpm.   The longest was approximately 4 minutes.   There were 53 new AF episodes. AF burden is 7.3% of the time. On [place], AF appears to be be ongoing as presenting rhythm is AF, sent to triage for ongoing AF and tachy arrhythmias. Monthly summary reports and ROV/PRN  Kathy Breach, RN, CCDS, [employer]  Spoke with patient's daughter (okay per DPR), patient has not reported any abnormal sx's but for the past week she is dealing with an acute shingles flare up to back, hip, waste and perineal area.  She saw her doctor last week and was not prescribed any form of antiviral as determined that it was too far in to course to be of much benefit.  No missed doses of meds and no other changes to report.  Notified daughter that I would follow up with A. Tillery, PA-C and let her know if anything further at this point.

## 2022-11-10 ENCOUNTER — Ambulatory Visit: Payer: Medicare Other

## 2022-11-10 DIAGNOSIS — I639 Cerebral infarction, unspecified: Secondary | ICD-10-CM

## 2022-11-11 LAB — CUP PACEART REMOTE DEVICE CHECK
Date Time Interrogation Session: 20240221230648
Implantable Pulse Generator Implant Date: 20230907

## 2022-11-21 NOTE — Progress Notes (Signed)
Carelink Summary Report / Loop Recorder 

## 2022-11-27 ENCOUNTER — Encounter: Payer: Medicare Other | Admitting: Internal Medicine

## 2022-12-01 ENCOUNTER — Encounter: Payer: Self-pay | Admitting: Family Medicine

## 2022-12-01 ENCOUNTER — Ambulatory Visit: Payer: Medicare Other | Admitting: Neurology

## 2022-12-02 NOTE — Progress Notes (Unsigned)
  Electrophysiology Office Note:   Date:  12/03/2022  ID:  Rachel Hines, DOB 11/27/1930, MRN [id number]  Primary Cardiologist: None Electrophysiologist: Cristopher Peru, MD   History of Present Illness:   Rachel Hines is a 89 y.o. female with CVA s/p loop recorder, and Paroxysmal -> persistent AF seen today for routine electrophysiology followup. Since last being seen in our clinic the patient reports doing well overall. Pt had increased burden of AF RVR during an infection with shingles, then had a flu like illness.   Currently she is back to her USOH. She hasn't been out and about much since getting over her flu, but denies undue SOB or fatigue. She does not have CP or palpitations. No edema or syncope. She would prefer to avoid procedures and additional medications.   Review of systems complete and found to be negative unless listed in HPI.   Device History: Medtronic loop recorder implanted 05/2022 for Cryptogenic Stroke   Studies Reviewed:    EKG is ordered today. Personal review shows AFL vs course fib at 65 bpm  Risk Assessment/Calculations:    CHA2DS2-VASc Score = at least 6           Physical Exam:   VS:  BP 128/80   Pulse 65   Ht 5\' 4"  (1.626 m)   Wt 150 lb (68 kg)   SpO2 98%   BMI 25.75 kg/m    Wt Readings from Last 3 Encounters:  12/03/22 150 lb (68 kg)  08/20/22 153 lb 6.4 oz (69.6 kg)  06/26/22 154 lb (69.9 kg)     GEN: Well nourished, well developed in no acute distress NECK: No JVD; No carotid bruits CARDIAC: Regular rate and rhythm, Irregularly irregular rate and rhythm, no murmurs, rubs, gallops RESPIRATORY:  Clear to auscultation without rales, wheezing or rhonchi  ABDOMEN: Soft, non-tender, non-distended EXTREMITIES:  No edema; No deformity   ASSESSMENT AND PLAN:    Cryptogenic Stroke s/p Medtronic Loop recorder Normal device function by Carelink.  No changes today   Paroxysmal AF Burden 98.7% by device. Suspect persistent since Mid February  with occasional undersensing.  CHA2DS2VASc  is at least 6 Rates overall well controlled with slight R shift to 90/100 Continue Eliquis. Labs today. We discussed options today including: Rate control Cardioversion with or without amiodarone.  Given age and h/o bradycardia amiodarone is likely her best option.    Bradycardia Asymptomatic   Continue atenolol 50 mg daily Pause detection on  Pt prefers to avoid additional meds or procedures at this time. She will continue rate control and call back if she has issues. I have changed her Loop to only record episodes > 60 minutes in this setting.   Otherwise, will plan follow up with Dr. Lovena Le in 2 months. To revisit symptoms and make sure she is tolerated rate control.   Signed, Shirley Friar, PA-C

## 2022-12-03 ENCOUNTER — Encounter: Payer: Self-pay | Admitting: Student

## 2022-12-03 ENCOUNTER — Ambulatory Visit: Payer: Medicare Other | Attending: Student | Admitting: Student

## 2022-12-03 VITALS — BP 128/80 | HR 65 | Ht 64.0 in | Wt 150.0 lb

## 2022-12-03 DIAGNOSIS — I10 Essential (primary) hypertension: Secondary | ICD-10-CM | POA: Diagnosis not present

## 2022-12-03 DIAGNOSIS — I639 Cerebral infarction, unspecified: Secondary | ICD-10-CM | POA: Insufficient documentation

## 2022-12-03 DIAGNOSIS — I480 Paroxysmal atrial fibrillation: Secondary | ICD-10-CM

## 2022-12-03 NOTE — Patient Instructions (Signed)
Medication Instructions:  Your physician recommends that you continue on your current medications as directed. Please refer to the Current Medication list given to you today.  *If you need a refill on your cardiac medications before your next appointment, please call your pharmacy*   Lab Work: BMET today If you have labs (blood work) drawn today and your tests are completely normal, you will receive your results only by: [place] (if you have MyChart) OR A paper copy in the mail If you have any lab test that is abnormal or we need to change your treatment, we will call you to review the results.  Follow-Up: At [facility], you and your health needs are our priority.  As part of our continuing mission to provide you with exceptional heart care, we have created designated Provider Care Teams.  These Care Teams include your primary Cardiologist (physician) and Advanced Practice Providers (APPs -  Physician Assistants and Nurse Practitioners) who all work together to provide you with the care you need, when you need it.   Your next appointment:   2 month(s)  Provider:   Cristopher Peru, MD

## 2022-12-04 LAB — BASIC METABOLIC PANEL
BUN/Creatinine Ratio: 21 (ref 12–28)
BUN: 23 mg/dL (ref 8–27)
CO2: 25 mmol/L (ref 20–29)
Calcium: 9.4 mg/dL (ref 8.7–10.3)
Chloride: 104 mmol/L (ref 96–106)
Creatinine, Ser: 1.11 mg/dL — ABNORMAL HIGH (ref 0.57–1.00)
Glucose: 99 mg/dL (ref 70–99)
Potassium: 4.7 mmol/L (ref 3.5–5.2)
Sodium: 143 mmol/L (ref 134–144)
eGFR: 48 mL/min/{1.73_m2} — ABNORMAL LOW (ref >59)

## 2022-12-15 ENCOUNTER — Ambulatory Visit (INDEPENDENT_AMBULATORY_CARE_PROVIDER_SITE_OTHER): Payer: Medicare Other

## 2022-12-15 DIAGNOSIS — I639 Cerebral infarction, unspecified: Secondary | ICD-10-CM

## 2022-12-16 DIAGNOSIS — E1165 Type 2 diabetes mellitus with hyperglycemia: Secondary | ICD-10-CM | POA: Diagnosis not present

## 2022-12-16 DIAGNOSIS — I10 Essential (primary) hypertension: Secondary | ICD-10-CM | POA: Diagnosis not present

## 2022-12-16 DIAGNOSIS — Z95818 Presence of other cardiac implants and grafts: Secondary | ICD-10-CM | POA: Diagnosis not present

## 2022-12-16 DIAGNOSIS — E7800 Pure hypercholesterolemia, unspecified: Secondary | ICD-10-CM | POA: Diagnosis not present

## 2022-12-16 DIAGNOSIS — I679 Cerebrovascular disease, unspecified: Secondary | ICD-10-CM | POA: Diagnosis not present

## 2022-12-16 LAB — CUP PACEART REMOTE DEVICE CHECK
Date Time Interrogation Session: 20240331230514
Implantable Pulse Generator Implant Date: 20230907

## 2022-12-19 NOTE — Progress Notes (Signed)
Carelink Summary Report / Loop Recorder 

## 2023-01-19 ENCOUNTER — Ambulatory Visit (INDEPENDENT_AMBULATORY_CARE_PROVIDER_SITE_OTHER): Payer: Medicare Other

## 2023-01-19 DIAGNOSIS — I639 Cerebral infarction, unspecified: Secondary | ICD-10-CM

## 2023-01-19 LAB — CUP PACEART REMOTE DEVICE CHECK
Date Time Interrogation Session: 20240503230346
Implantable Pulse Generator Implant Date: 20230907

## 2023-01-20 NOTE — Progress Notes (Signed)
Carelink Summary Report / Loop Recorder 

## 2023-02-13 ENCOUNTER — Telehealth: Payer: Self-pay

## 2023-02-13 NOTE — Telephone Encounter (Signed)
ILR alert for pause, event occurred 5/30 @ 08:43, duration 4sec, not a conversion pause - route to triage Persistent AF, overall controlled rates, Eliquis per EPIC Per 3/20 ov visiti, pt. desires "rate control" strategy  Spoke with patients daughter, denied that patient had any syncopal episodes dizziness. She stated that patient typically gets up at 0600AM drinks coffee and usually naps around 7:30-8:30

## 2023-02-17 NOTE — Progress Notes (Signed)
Carelink Summary Report / Loop Recorder 

## 2023-02-23 ENCOUNTER — Ambulatory Visit (INDEPENDENT_AMBULATORY_CARE_PROVIDER_SITE_OTHER): Payer: Medicare Other

## 2023-02-23 DIAGNOSIS — I639 Cerebral infarction, unspecified: Secondary | ICD-10-CM | POA: Diagnosis not present

## 2023-02-23 LAB — CUP PACEART REMOTE DEVICE CHECK
Date Time Interrogation Session: 20240609230103
Implantable Pulse Generator Implant Date: 20230907

## 2023-03-03 ENCOUNTER — Ambulatory Visit: Payer: Medicare Other | Attending: Internal Medicine | Admitting: Internal Medicine

## 2023-03-03 ENCOUNTER — Encounter: Payer: Self-pay | Admitting: Internal Medicine

## 2023-03-03 VITALS — BP 142/80 | HR 62 | Ht 64.0 in | Wt 152.0 lb

## 2023-03-03 DIAGNOSIS — I4819 Other persistent atrial fibrillation: Secondary | ICD-10-CM | POA: Diagnosis not present

## 2023-03-03 NOTE — Progress Notes (Signed)
HPI Rachel Hines returns for ongoing evaluation of a cryptogenic stroke. The patient has a h/o HTN, and dyslipidemia and sustained a stroke and underwent ILR insertion where she was found to have atrial fib with a CVR. She is asymptomatic. She has done well with essentially complete recovery. No syncope. Allergies  Allergen Reactions   Atorvastatin Other (See Comments)    Severe muscle pain, bone pain   Other Rash and Other (See Comments)    Food dyes- Rashes and joint pain     Current Outpatient Medications  Medication Sig Dispense Refill   acetaminophen (TYLENOL) 500 MG tablet Take 500 mg by mouth every 6 (six) hours as needed for mild pain or headache.     apixaban (ELIQUIS) 5 MG TABS tablet Take 1 tablet (5 mg total) by mouth 2 (two) times daily. 180 tablet 3   atenolol (TENORMIN) 50 MG tablet Take 1 tablet (50 mg total) by mouth every morning. 90 tablet 3   olmesartan (BENICAR) 40 MG tablet Take 40 mg by mouth daily.     rosuvastatin (CRESTOR) 5 MG tablet Take 1 tablet (5 mg total) by mouth daily. 90 tablet 3   zolpidem (AMBIEN) 10 MG tablet Take 10 mg by mouth at bedtime.     No current facility-administered medications for this visit.     Past Medical History:  Diagnosis Date   Acute meniscal injury of left knee    Arthritis    GERD (gastroesophageal reflux disease)    H/O hiatal hernia    Heart block AV first degree    History of esophageal dilatation    Hyperlipidemia    Hypertension    Impaired hearing    Left knee pain    severe   Type 2 diabetes mellitus ([facility])    Wears hearing aid     ROS:   All systems reviewed and negative except as noted in the HPI.   Past Surgical History:  Procedure Laterality Date   ABDOMINAL HYSTERECTOMY  1960'S   CATARACT EXTRACTION W/ INTRAOCULAR LENS IMPLANT Right    CHONDROPLASTY  04/14/2013   Procedure:  ABRATION CHONDROPLASTY OF MEDIAL CHONDIAL;  Surgeon: Jacki Cones, MD;  Location: [facility];   Service: Orthopedics;;   KNEE ARTHROSCOPY WITH LATERAL MENISECTOMY Left 04/14/2013   Procedure: KNEE ARTHROSCOPY WITH LATERAL MENISECTOMY;  Surgeon: Jacki Cones, MD;  Location: [facility];  Service: Orthopedics;  Laterality: Left;   KNEE ARTHROSCOPY WITH MEDIAL MENISECTOMY Left 04/14/2013   Procedure: LEFT KNEE ARTHROSCOPY WITH MEDIAL MENISECTOMY;  Surgeon: Jacki Cones, MD;  Location: [facility];  Service: Orthopedics;  Laterality: Left;   TOTAL KNEE ARTHROPLASTY Right 2011     History reviewed. No pertinent family history.   Social History   Socioeconomic History   Marital status: Widowed    Spouse name: Not on file   Number of children: Not on file   Years of education: Not on file   Highest education level: Not on file  Occupational History   Not on file  Tobacco Use   Smoking status: Never   Smokeless tobacco: Never  Substance and Sexual Activity   Alcohol use: No   Drug use: No   Sexual activity: Not on file  Other Topics Concern   Not on file  Social History Narrative   Not on file   Social Determinants of Health   Financial Resource Strain: Not on file  Food Insecurity: Not  on file  Transportation Needs: Not on file  Physical Activity: Not on file  Stress: Not on file  Social Connections: Not on file  Intimate Partner Violence: Not on file     BP (!) 142/80   Pulse 62   Ht 5\' 4"  (1.626 m)   Wt 152 lb (68.9 kg)   SpO2 95%   BMI 26.09 kg/m   Physical Exam:  Well appearing NAD HEENT: Unremarkable Neck:  No JVD, no thyromegally Lymphatics:  No adenopathy Back:  No CVA tenderness Lungs:  Clear with no wheezes HEART:  Regular rate rhythm, no murmurs, no rubs, no clicks Abd:  soft, positive bowel sounds, no organomegally, no rebound, no guarding Ext:  2 plus pulses, no edema, no cyanosis, no clubbing Skin:  No rashes no nodules Neuro:  CN II through XII intact, motor grossly intact  DEVICE  Normal device  function.  See PaceArt for details. Atrial fib with a CVR 99%  Assess/Plan:  Cryptogenic stroke -She has been found to have atrial fib. She will continue her blood thinner. She is asymptoamtic. HTN - her bp is fairly well controlled.  Persistent atrial fib - she is asymptomatic and rates are fairly well controlled. She will undergo watchful waiting.   Sharlot Gowda Aundreya Souffrant,MD

## 2023-03-03 NOTE — Patient Instructions (Signed)
Medication Instructions:  Your physician recommends that you continue on your current medications as directed. Please refer to the Current Medication list given to you today.  *If you need a refill on your cardiac medications before your next appointment, please call your pharmacy*  Lab Work: If you have labs (blood work) drawn today and your tests are completely normal, you will receive your results only by: MyChart Message (if you have MyChart) OR A paper copy in the mail If you have any lab test that is abnormal or we need to change your treatment, we will call you to review the results.  Follow-Up: At [facility], you and your health needs are our priority.  As part of our continuing mission to provide you with exceptional heart care, we have created designated Provider Care Teams.  These Care Teams include your primary Cardiologist (physician) and Advanced Practice Providers (APPs -  Physician Assistants and Nurse Practitioners) who all work together to provide you with the care you need, when you need it.  We recommend signing up for the patient portal called "MyChart".  Sign up information is provided on this After Visit Summary.  MyChart is used to connect with patients for Virtual Visits (Telemedicine).  Patients are able to view lab/test results, encounter notes, upcoming appointments, etc.  Non-urgent messages can be sent to your provider as well.   To learn more about what you can do with MyChart, go to [email or web address].    Your next appointment:   1 year(s)  Provider:   You may see Lewayne Bunting, MD

## 2023-03-17 DIAGNOSIS — G4700 Insomnia, unspecified: Secondary | ICD-10-CM | POA: Diagnosis not present

## 2023-03-17 DIAGNOSIS — I679 Cerebrovascular disease, unspecified: Secondary | ICD-10-CM | POA: Diagnosis not present

## 2023-03-17 DIAGNOSIS — E1169 Type 2 diabetes mellitus with other specified complication: Secondary | ICD-10-CM | POA: Diagnosis not present

## 2023-03-17 DIAGNOSIS — E7800 Pure hypercholesterolemia, unspecified: Secondary | ICD-10-CM | POA: Diagnosis not present

## 2023-03-17 DIAGNOSIS — I77810 Thoracic aortic ectasia: Secondary | ICD-10-CM | POA: Diagnosis not present

## 2023-03-17 DIAGNOSIS — Z0000 Encounter for general adult medical examination without abnormal findings: Secondary | ICD-10-CM | POA: Diagnosis not present

## 2023-03-17 DIAGNOSIS — M129 Arthropathy, unspecified: Secondary | ICD-10-CM | POA: Diagnosis not present

## 2023-03-17 DIAGNOSIS — Z95818 Presence of other cardiac implants and grafts: Secondary | ICD-10-CM | POA: Diagnosis not present

## 2023-03-17 DIAGNOSIS — Z1331 Encounter for screening for depression: Secondary | ICD-10-CM | POA: Diagnosis not present

## 2023-03-17 DIAGNOSIS — I700 Atherosclerosis of aorta: Secondary | ICD-10-CM | POA: Diagnosis not present

## 2023-03-17 DIAGNOSIS — I10 Essential (primary) hypertension: Secondary | ICD-10-CM | POA: Diagnosis not present

## 2023-03-17 NOTE — Progress Notes (Signed)
Carelink Summary Report / Loop Recorder 

## 2023-03-30 ENCOUNTER — Ambulatory Visit (INDEPENDENT_AMBULATORY_CARE_PROVIDER_SITE_OTHER): Payer: Medicare Other

## 2023-03-30 DIAGNOSIS — I4819 Other persistent atrial fibrillation: Secondary | ICD-10-CM | POA: Diagnosis not present

## 2023-03-31 LAB — CUP PACEART REMOTE DEVICE CHECK
Date Time Interrogation Session: 20240712230954
Implantable Pulse Generator Implant Date: 20230907

## 2023-04-13 NOTE — Progress Notes (Signed)
Carelink Summary Report / Loop Recorder 

## 2023-05-04 ENCOUNTER — Ambulatory Visit (INDEPENDENT_AMBULATORY_CARE_PROVIDER_SITE_OTHER): Payer: Medicare Other

## 2023-05-04 DIAGNOSIS — I4819 Other persistent atrial fibrillation: Secondary | ICD-10-CM

## 2023-05-04 LAB — CUP PACEART REMOTE DEVICE CHECK
Date Time Interrogation Session: 20240818230544
Implantable Pulse Generator Implant Date: 20230907

## 2023-05-14 NOTE — Progress Notes (Signed)
Carelink Summary Report / Loop Recorder 

## 2023-06-03 ENCOUNTER — Other Ambulatory Visit (HOSPITAL_BASED_OUTPATIENT_CLINIC_OR_DEPARTMENT_OTHER): Payer: Self-pay

## 2023-06-03 MED ORDER — OLMESARTAN MEDOXOMIL 40 MG PO TABS: 40.0000 mg | ORAL_TABLET | Freq: Every day | ORAL | 1 refills | Status: DC

## 2023-06-04 DIAGNOSIS — M1712 Unilateral primary osteoarthritis, left knee: Secondary | ICD-10-CM | POA: Diagnosis not present

## 2023-06-04 DIAGNOSIS — M25562 Pain in left knee: Secondary | ICD-10-CM | POA: Diagnosis not present

## 2023-06-08 ENCOUNTER — Ambulatory Visit (INDEPENDENT_AMBULATORY_CARE_PROVIDER_SITE_OTHER): Payer: Medicare Other

## 2023-06-08 DIAGNOSIS — I639 Cerebral infarction, unspecified: Secondary | ICD-10-CM

## 2023-06-08 LAB — CUP PACEART REMOTE DEVICE CHECK
Date Time Interrogation Session: 20240920230526
Implantable Pulse Generator Implant Date: 20230907

## 2023-06-19 NOTE — Progress Notes (Signed)
Carelink Summary Report / Loop Recorder 

## 2023-07-09 ENCOUNTER — Ambulatory Visit (INDEPENDENT_AMBULATORY_CARE_PROVIDER_SITE_OTHER): Payer: Medicare Other

## 2023-07-09 DIAGNOSIS — I639 Cerebral infarction, unspecified: Secondary | ICD-10-CM | POA: Diagnosis not present

## 2023-07-09 LAB — CUP PACEART REMOTE DEVICE CHECK
Date Time Interrogation Session: 20241023231208
Implantable Pulse Generator Implant Date: 20230907

## 2023-07-18 ENCOUNTER — Other Ambulatory Visit: Payer: Self-pay | Admitting: Student

## 2023-07-20 NOTE — Telephone Encounter (Signed)
Prescription refill request for Eliquis received. Indication: AF/CVA Last office visit: 03/03/23  Rosette Reveal MD Scr: 1.11 on 12/03/22 Age: 89 Weight: 68.9kg  Based on above findings Eliquis 5mg  twice daily is the appropriate dose.  Refill approved.

## 2023-07-28 NOTE — Progress Notes (Signed)
Carelink Summary Report / Loop Recorder 

## 2023-08-11 ENCOUNTER — Ambulatory Visit: Payer: Medicare Other

## 2023-08-11 DIAGNOSIS — I639 Cerebral infarction, unspecified: Secondary | ICD-10-CM | POA: Diagnosis not present

## 2023-08-11 LAB — CUP PACEART REMOTE DEVICE CHECK
Date Time Interrogation Session: 20241125231031
Implantable Pulse Generator Implant Date: 20230907

## 2023-08-21 DIAGNOSIS — Z23 Encounter for immunization: Secondary | ICD-10-CM | POA: Diagnosis not present

## 2023-08-28 ENCOUNTER — Other Ambulatory Visit: Payer: Self-pay | Admitting: Student

## 2023-09-10 NOTE — Progress Notes (Signed)
Carelink Summary Report / Loop Recorder 

## 2023-09-12 IMAGING — CT CT CHEST W/ CM
2 of 3 series · 13 of 36 positions shown, 16 images · IV contrast (OMNIPAQUE 300)
Comparison: None available.

CLINICAL DATA: Initial evaluation for acute trauma, fall.

EXAM:
CT CHEST WITH CONTRAST
TECHNIQUE: Multidetector CT imaging of the chest was performed during
intravenous contrast administration.

[Series 3: axial st · axial · 0.62mm/px · z∈[-193,+9]mm · 10 of 119 slices shown, 13 images]
[im 9/119  mediastinal]
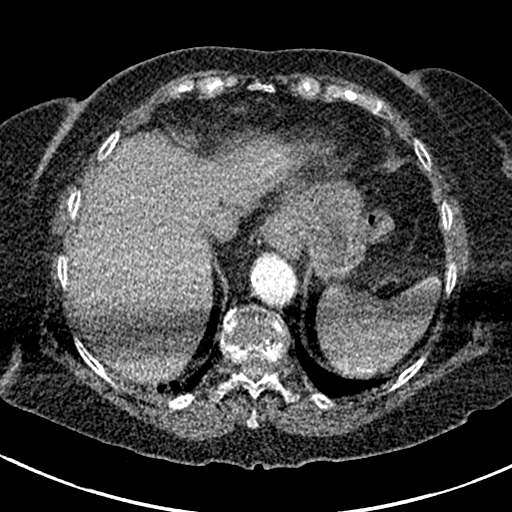
[im 9/119  lung]
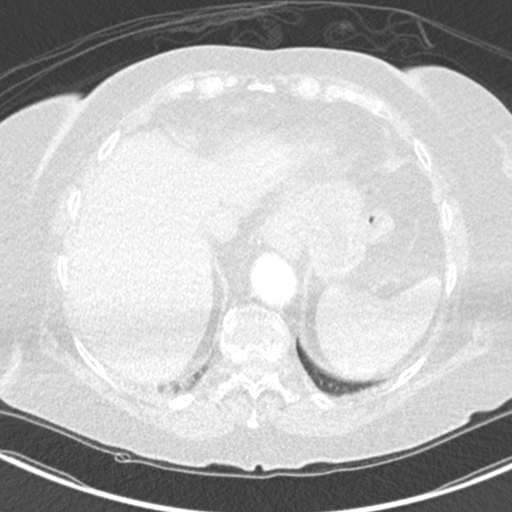
[im 18/119  lung]
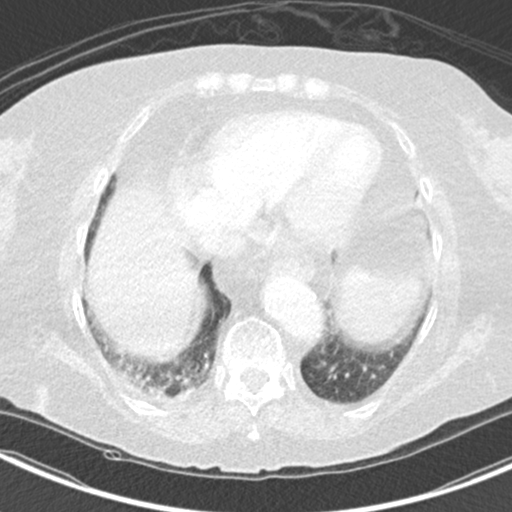
[im 31/119  lung]
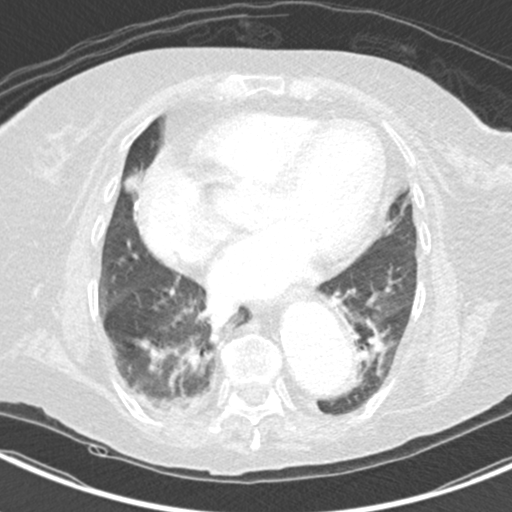
[im 44/119  lung]
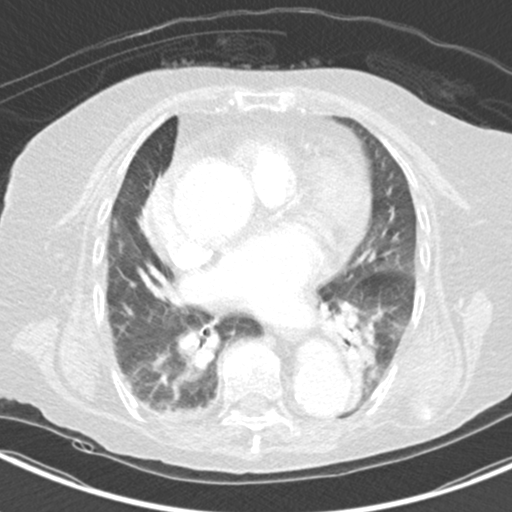
[im 53/119  mediastinal]
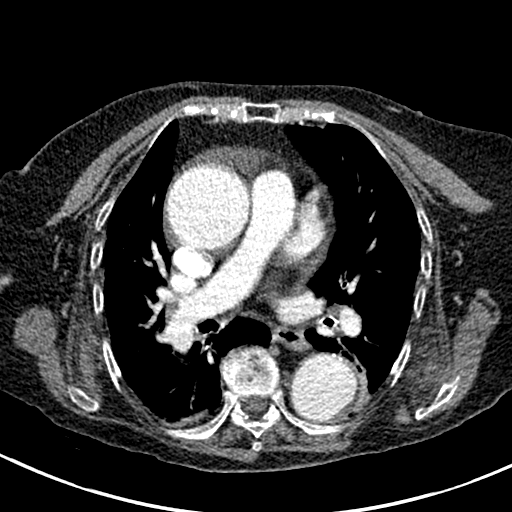
[im 53/119  lung]
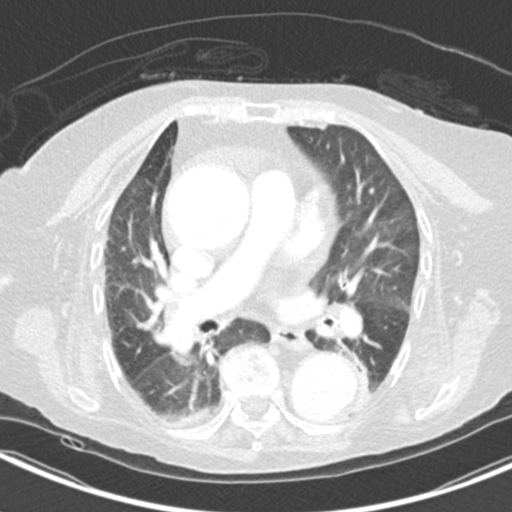
[im 66/119  lung]
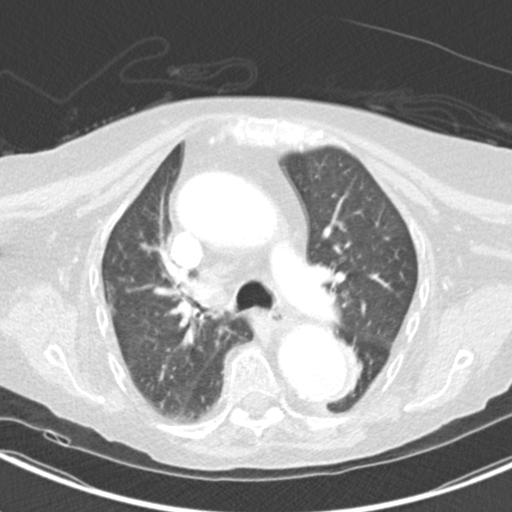
[im 75/119  lung]
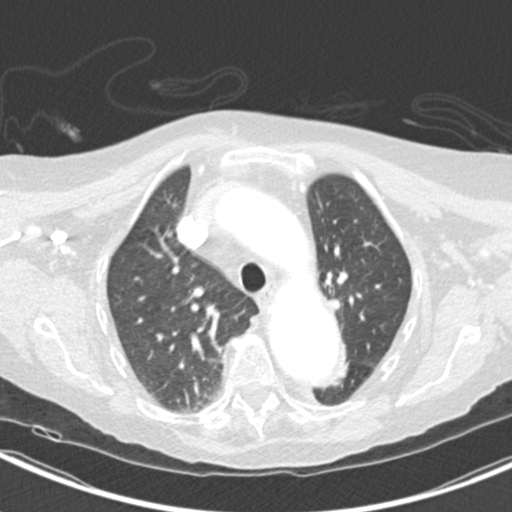
[im 88/119  lung]
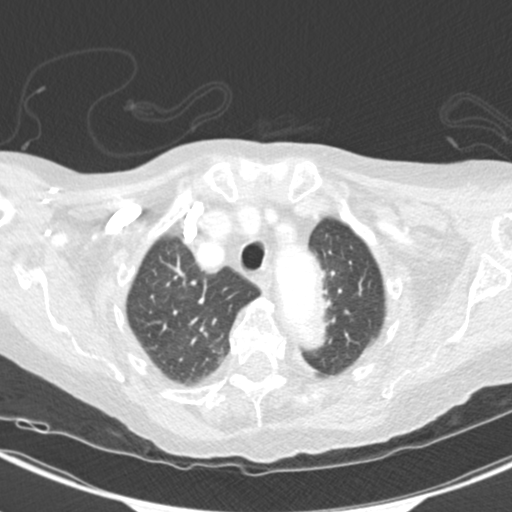
[im 101/119  mediastinal]
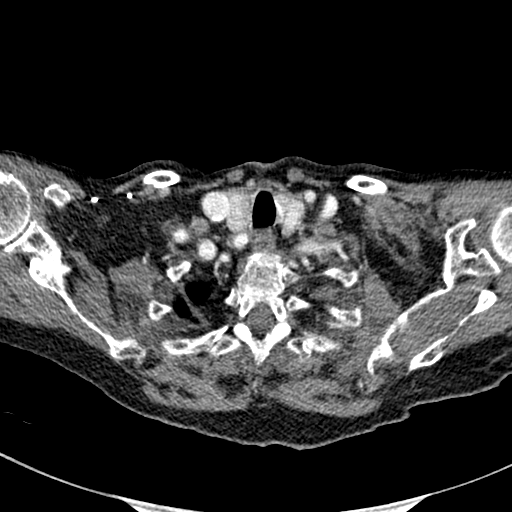
[im 101/119  lung]
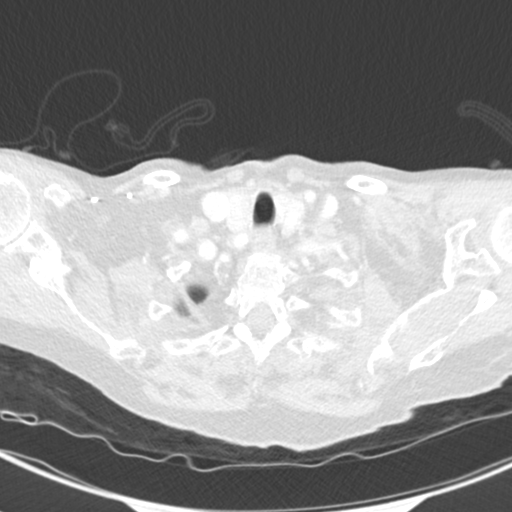
[im 110/119  lung]
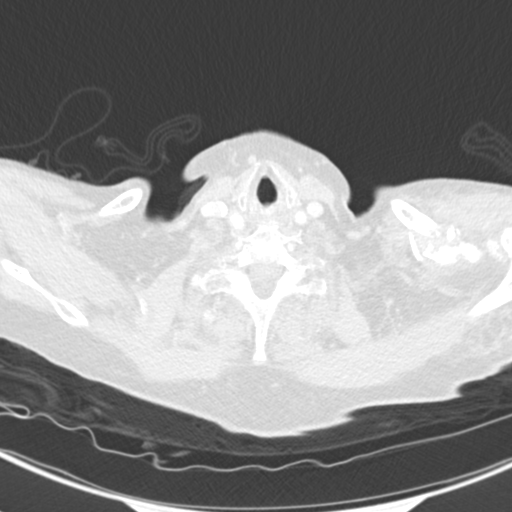

[Series 6: coronal · coronal · 0.47mm/px · 3 of 130 slices shown]
[im 26/130  lung]
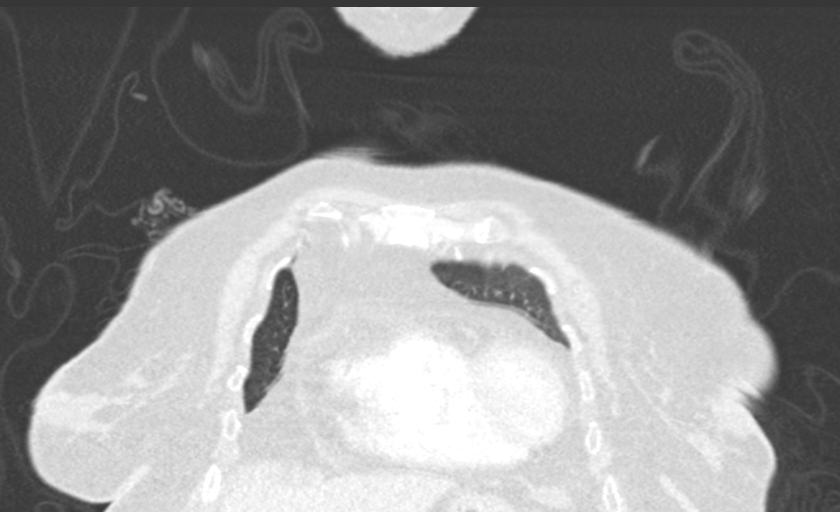
[im 52/130  lung]
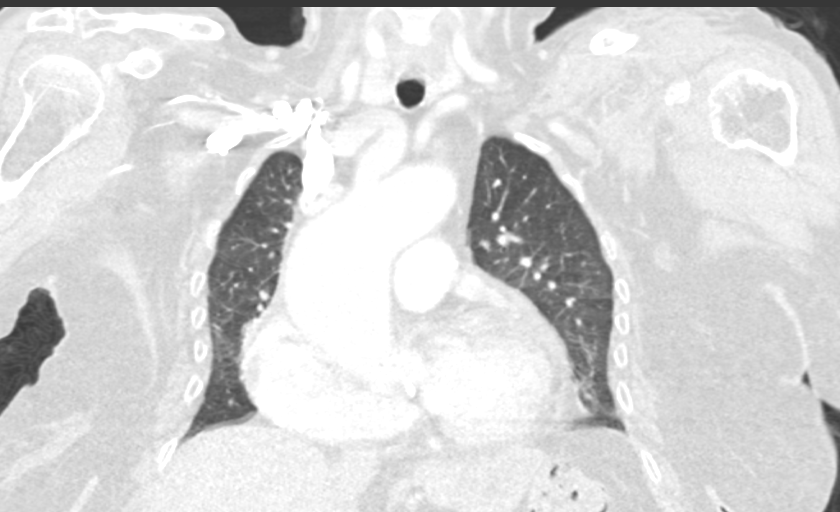
[im 78/130  lung]
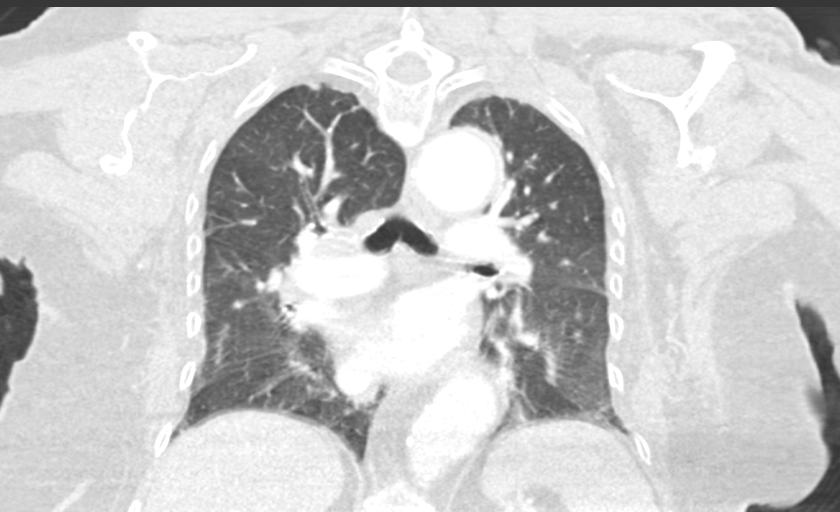

[13 of 36 positions shown; findings below may reference images not displayed]

RADIATION DOSE REDUCTION: This exam was performed according to the
departmental dose-optimization program which includes automated
exposure control, adjustment of the mA and/or kV according to
patient size and/or use of iterative reconstruction technique.

CONTRAST:  75mL OMNIPAQUE IOHEXOL 300 MG/ML  SOLN
FINDINGS: Cardiovascular: Diffuse aneurysmal dilatation of the intrathoracic
aorta is seen. Ascending aorta measures up to 5 cm in diameter.
Aortic arch dilated up to 3.5 cm. Descending intrathoracic aorta
dilated up to 4.5 cm. No acute aortic pathology. Visualized great
vessels intact. Cardiomegaly noted. Trace pericardial effusion.
Limited assessment of the pulmonary arterial tree grossly
unremarkable.

Mediastinum/Nodes: Thyroid within normal limits. No enlarged
mediastinal, hilar, or axillary lymph nodes. Esophagus within normal
limits. Small hiatal hernia noted.

Lungs/Pleura: Tracheobronchial tree intact and patent. Lungs
normally inflated. Scattered subsegmental atelectatic changes noted
within the right middle lobe, lingula, and lower lobes bilaterally.
No focal infiltrates or consolidative airspace disease. No pulmonary
edema or pleural effusion. No pneumothorax. No worrisome pulmonary
nodule or mass.

Upper Abdomen: Visualized upper abdomen demonstrates no acute
finding.

Musculoskeletal: Comminuted fracture involving the distal third of
the left clavicle. Associated soft tissue stranding within the
adjacent left supraclavicular region. No frank active contrast
extravasation. No other acute osseous abnormality. Visualized ribs
are intact. Exaggeration of the normal thoracic kyphosis noted. No
discrete or worrisome osseous lesions.
IMPRESSION: 1. Comminuted fracture involving the distal third of the left
clavicle. Associated soft tissue stranding within the adjacent left
supraclavicular region without active contrast extravasation.
2. No other acute traumatic injury or other abnormality within the
thorax.
3. Cardiomegaly with trace pericardial effusion.
4. Aneurysmal dilatation of the intrathoracic aorta, with the
ascending aorta measuring up to 5 cm in diameter. Ascending thoracic
aortic aneurysm. Recommend semi-annual imaging followup by CTA or
MRA and referral to cardiothoracic surgery if not already obtained.
This recommendation follows 4090
ACCF/AHA/AATS/ACR/ASA/SCA/MADRAZO/EDSOM/CHEPITO/DAVIAN Guidelines for the
Diagnosis and Management of Patients With Thoracic Aortic Disease.
Circulation. 4090; 121: E266-e369. Aortic aneurysm NOS ([id number])

## 2023-09-12 IMAGING — CT CT HEAD W/O CM
3 series · 15 of 47 positions shown, 18 images · non-contrast
Comparison: None available.

CLINICAL DATA: Initial evaluation for acute trauma, fall.



[Series 3: head wo · axial · 0.47mm/px · z∈[+112,+237]mm · 9 of 30 slices shown, 12 images]
[im 3/30  brain]
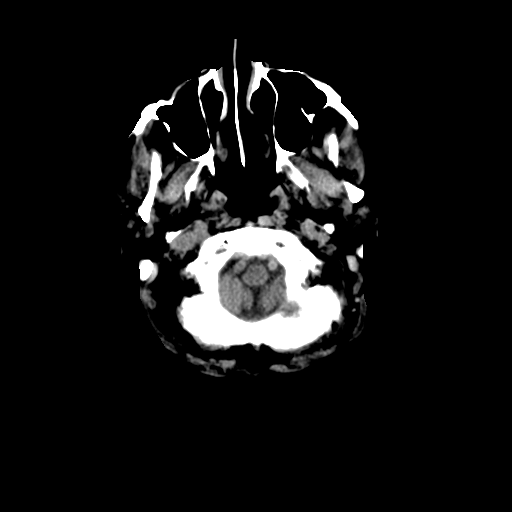
[im 3/30  bone]
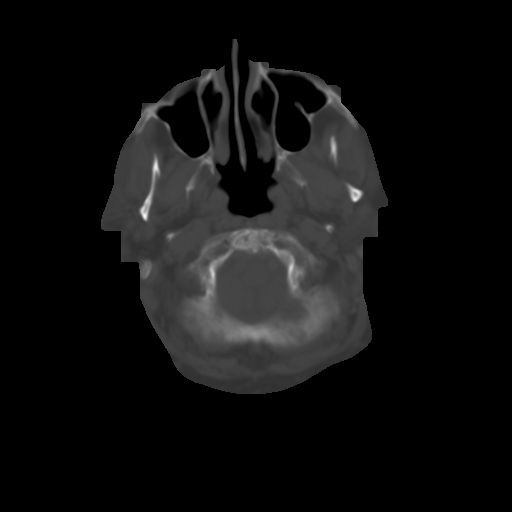
[im 6/30  brain]
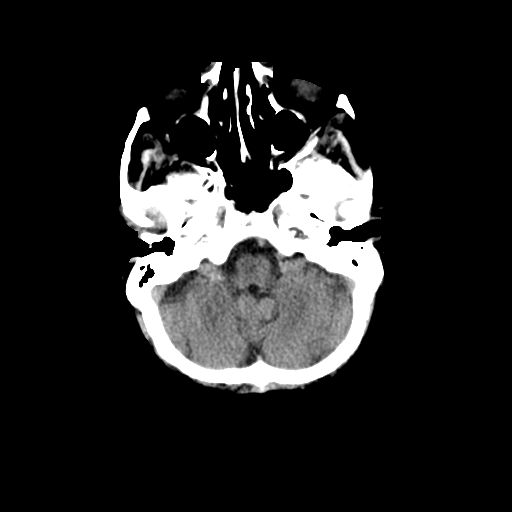
[im 9/30  brain]
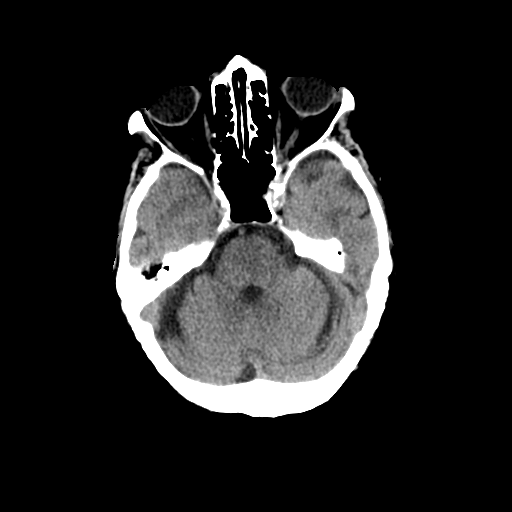
[im 12/30  brain]
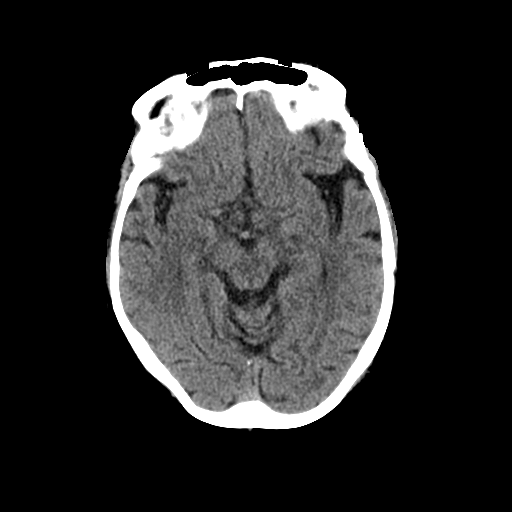
[im 16/30  brain]
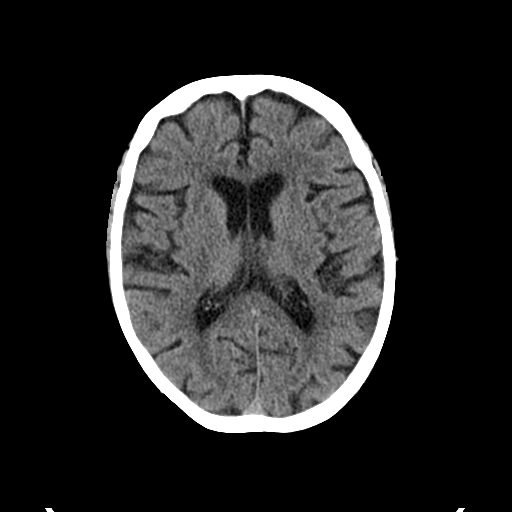
[im 16/30  bone]
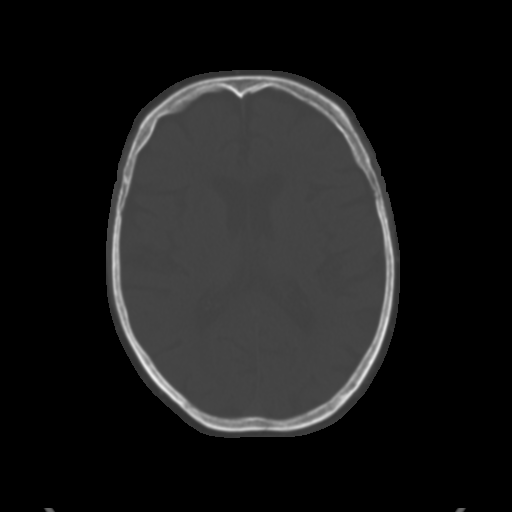
[im 19/30  brain]
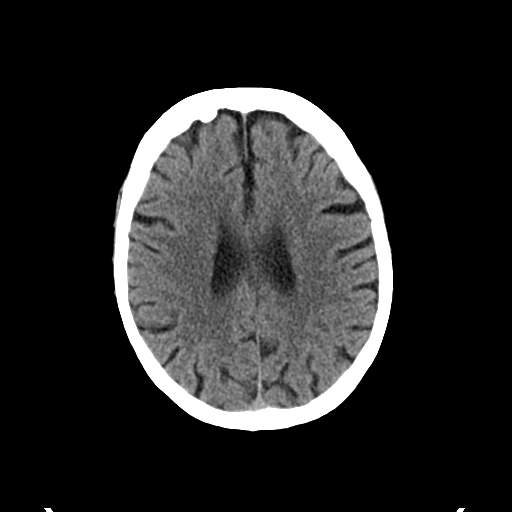
[im 22/30  brain]
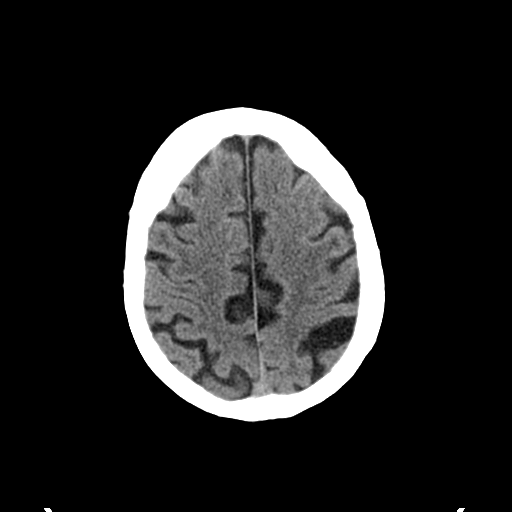
[im 25/30  brain]
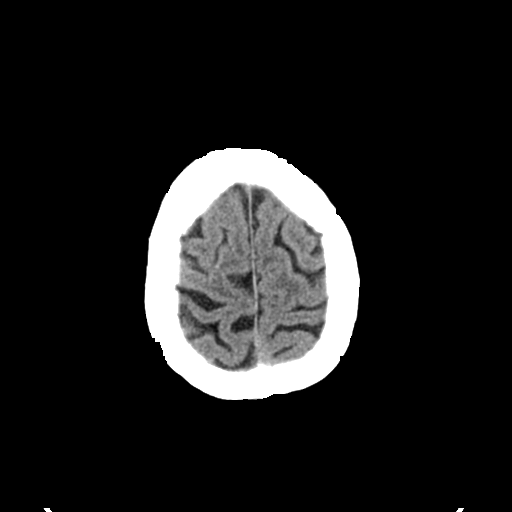
[im 28/30  brain]
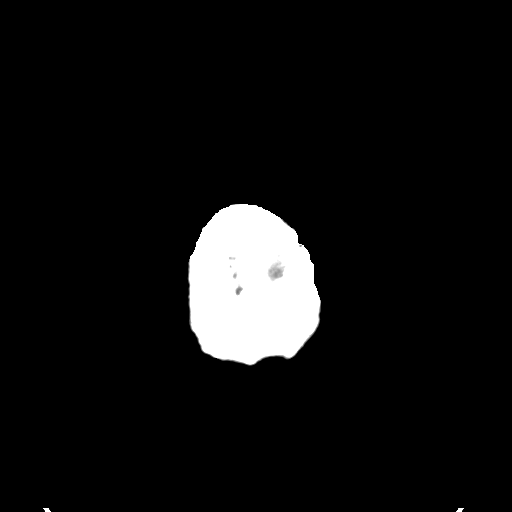
[im 28/30  bone]
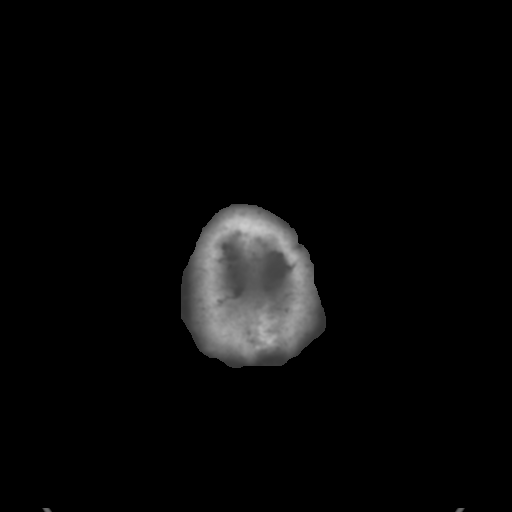

[Series 5: coronal soft tissue · coronal · 0.28mm/px · 3 of 61 slices shown]
[im 21/61  brain]
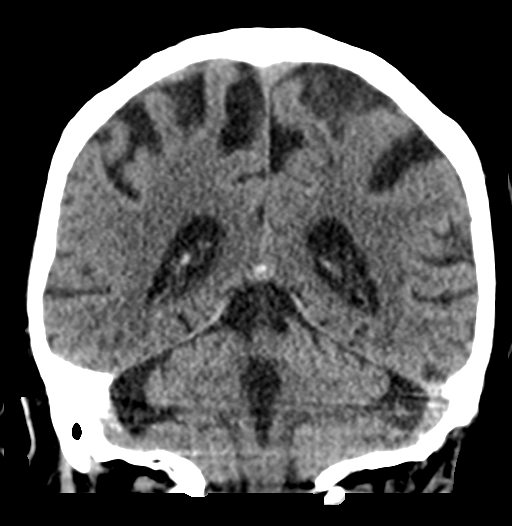
[im 27/61  brain]
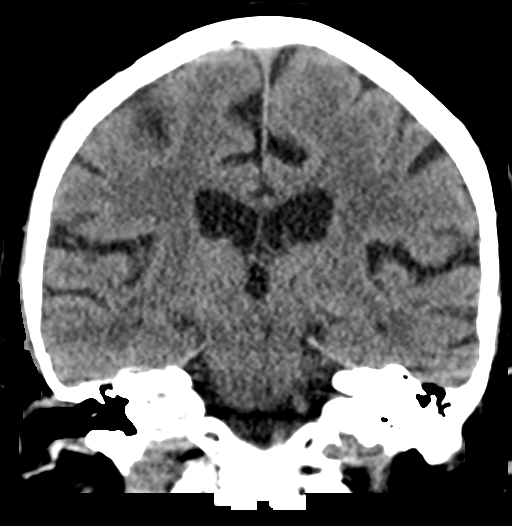
[im 34/61  brain]
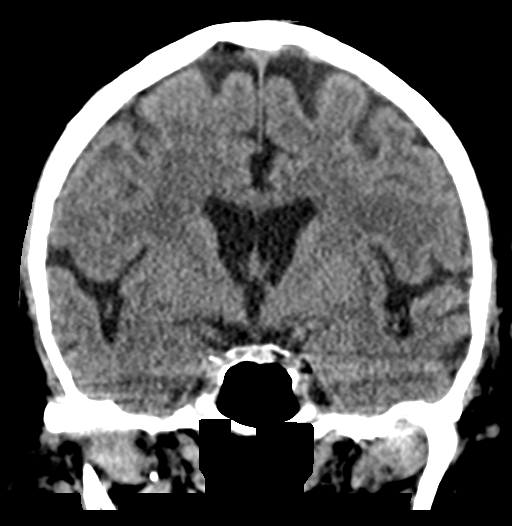

[Series 6: sagittal soft tissue · sagittal · 0.29mm/px · 3 of 49 slices shown]
[im 17/49  brain]
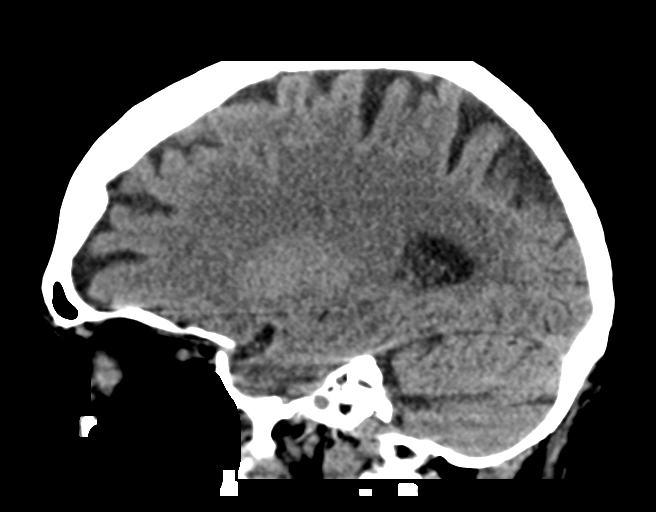
[im 25/49  brain]
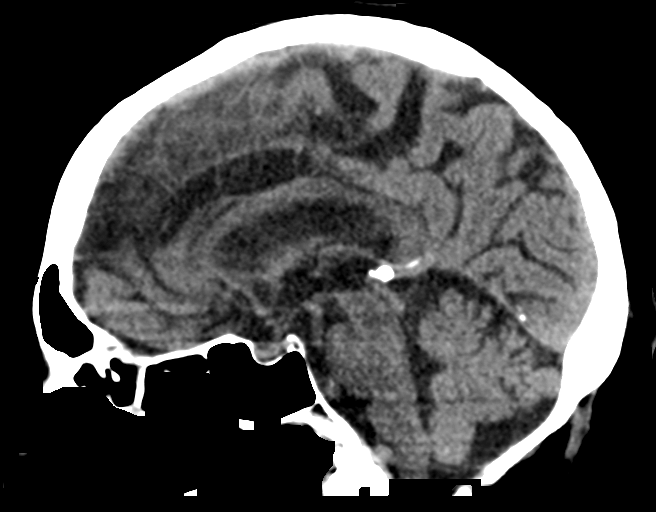
[im 33/49  brain]
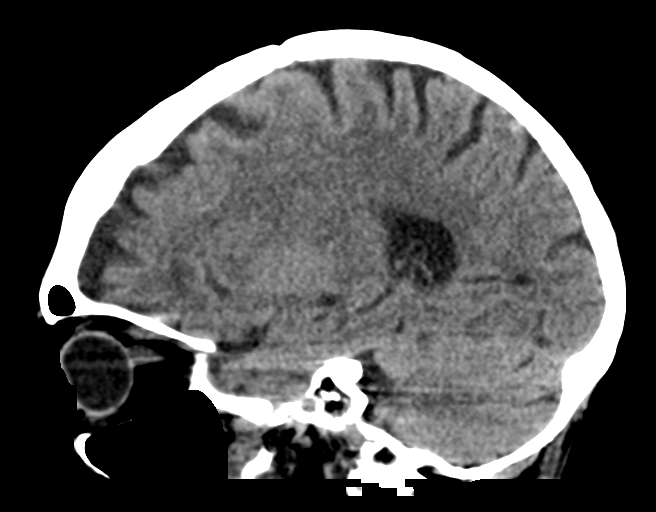

[15 of 47 positions shown; findings below may reference images not displayed]

FINDINGS: Brain: Generalized age-related cerebral atrophy with chronic small
vessel ischemic disease. Dilated perivascular space present at the
inferior left basal ganglia. No acute intracranial hemorrhage. No
acute large vessel territory infarct. No mass lesion, mass effect or
midline shift. No hydrocephalus or extra-axial fluid collection.

Vascular: No hyperdense vessel. Scattered vascular calcifications
noted within the carotid siphons.

Skull: Scalp soft tissues within normal limits.  Calvarium intact.

Sinuses/Orbits: Globes and orbital soft tissues demonstrate no acute
finding. Paranasal sinuses are largely clear. No significant mastoid
effusion.

Other: None.
IMPRESSION: 1. No acute intracranial abnormality.
2. Generalized age-related cerebral atrophy with chronic small
vessel ischemic disease.

## 2023-09-14 ENCOUNTER — Ambulatory Visit (INDEPENDENT_AMBULATORY_CARE_PROVIDER_SITE_OTHER): Payer: Medicare Other

## 2023-09-14 DIAGNOSIS — I639 Cerebral infarction, unspecified: Secondary | ICD-10-CM | POA: Diagnosis not present

## 2023-09-15 ENCOUNTER — Other Ambulatory Visit: Payer: Self-pay | Admitting: Student

## 2023-09-15 LAB — CUP PACEART REMOTE DEVICE CHECK
Date Time Interrogation Session: 20241229231541
Implantable Pulse Generator Implant Date: 20230907

## 2023-09-24 DIAGNOSIS — Z95818 Presence of other cardiac implants and grafts: Secondary | ICD-10-CM | POA: Diagnosis not present

## 2023-09-24 DIAGNOSIS — M129 Arthropathy, unspecified: Secondary | ICD-10-CM | POA: Diagnosis not present

## 2023-09-24 DIAGNOSIS — E7800 Pure hypercholesterolemia, unspecified: Secondary | ICD-10-CM | POA: Diagnosis not present

## 2023-09-24 DIAGNOSIS — I77810 Thoracic aortic ectasia: Secondary | ICD-10-CM | POA: Diagnosis not present

## 2023-09-24 DIAGNOSIS — Z23 Encounter for immunization: Secondary | ICD-10-CM | POA: Diagnosis not present

## 2023-09-24 DIAGNOSIS — G4700 Insomnia, unspecified: Secondary | ICD-10-CM | POA: Diagnosis not present

## 2023-09-24 DIAGNOSIS — I700 Atherosclerosis of aorta: Secondary | ICD-10-CM | POA: Diagnosis not present

## 2023-09-24 DIAGNOSIS — I10 Essential (primary) hypertension: Secondary | ICD-10-CM | POA: Diagnosis not present

## 2023-09-24 DIAGNOSIS — E119 Type 2 diabetes mellitus without complications: Secondary | ICD-10-CM | POA: Diagnosis not present

## 2023-09-24 DIAGNOSIS — B0229 Other postherpetic nervous system involvement: Secondary | ICD-10-CM | POA: Diagnosis not present

## 2023-10-19 ENCOUNTER — Ambulatory Visit (INDEPENDENT_AMBULATORY_CARE_PROVIDER_SITE_OTHER): Payer: Medicare Other

## 2023-10-19 DIAGNOSIS — I639 Cerebral infarction, unspecified: Secondary | ICD-10-CM

## 2023-10-19 LAB — CUP PACEART REMOTE DEVICE CHECK
Date Time Interrogation Session: 20250202231441
Implantable Pulse Generator Implant Date: 20230907

## 2023-10-21 ENCOUNTER — Encounter (HOSPITAL_BASED_OUTPATIENT_CLINIC_OR_DEPARTMENT_OTHER): Payer: Self-pay

## 2023-10-21 ENCOUNTER — Other Ambulatory Visit: Payer: Self-pay

## 2023-10-21 ENCOUNTER — Emergency Department (HOSPITAL_BASED_OUTPATIENT_CLINIC_OR_DEPARTMENT_OTHER)
Admission: EM | Admit: 2023-10-21 | Discharge: 2023-10-21 | Disposition: A | Discharge status: Home / Self Care | Payer: Medicare Other | Attending: Emergency Medicine | Admitting: Emergency Medicine

## 2023-10-21 ENCOUNTER — Emergency Department (HOSPITAL_BASED_OUTPATIENT_CLINIC_OR_DEPARTMENT_OTHER): Payer: Medicare Other | Admitting: Radiology

## 2023-10-21 DIAGNOSIS — W01198A Fall on same level from slipping, tripping and stumbling with subsequent striking against other object, initial encounter: Secondary | ICD-10-CM | POA: Insufficient documentation

## 2023-10-21 DIAGNOSIS — S42021A Displaced fracture of shaft of right clavicle, initial encounter for closed fracture: Secondary | ICD-10-CM | POA: Diagnosis not present

## 2023-10-21 DIAGNOSIS — I517 Cardiomegaly: Secondary | ICD-10-CM | POA: Diagnosis not present

## 2023-10-21 DIAGNOSIS — S4991XA Unspecified injury of right shoulder and upper arm, initial encounter: Secondary | ICD-10-CM | POA: Diagnosis present

## 2023-10-21 DIAGNOSIS — M19011 Primary osteoarthritis, right shoulder: Secondary | ICD-10-CM | POA: Diagnosis not present

## 2023-10-21 DIAGNOSIS — W19XXXA Unspecified fall, initial encounter: Secondary | ICD-10-CM

## 2023-10-21 DIAGNOSIS — Z7901 Long term (current) use of anticoagulants: Secondary | ICD-10-CM | POA: Diagnosis not present

## 2023-10-21 DIAGNOSIS — M85811 Other specified disorders of bone density and structure, right shoulder: Secondary | ICD-10-CM | POA: Diagnosis not present

## 2023-10-21 DIAGNOSIS — S42001A Fracture of unspecified part of right clavicle, initial encounter for closed fracture: Secondary | ICD-10-CM | POA: Insufficient documentation

## 2023-10-21 HISTORY — DX: Unspecified atrial fibrillation: I48.91

## 2023-10-21 MED ORDER — OXYCODONE-ACETAMINOPHEN 5-325 MG PO TABS: 1.0000 | ORAL_TABLET | Freq: Four times a day (QID) | ORAL | 0 refills | Status: DC | PRN

## 2023-10-21 MED ORDER — OXYCODONE HCL 5 MG PO TABS
5.0000 mg | ORAL_TABLET | Freq: Once | ORAL | Status: AC
  Administered 2023-10-21: 5 mg via ORAL
  Filled 2023-10-21: qty 1

## 2023-10-21 NOTE — Discharge Instructions (Signed)
 Use ice and Tylenol  every 4 hours needed for pain.  Use arm sling often for support. For severe or breakthrough pain take oxycodone  every 6 hours as needed but it makes you sleepy so be careful. Follow-up with primary doctor and orthopedics.

## 2023-10-21 NOTE — ED Provider Notes (Signed)
 [place] EM[facility]GENC[facility]MENT AT [facility] Provider Note   CSN: [id number] Arrival date & time: 10/21/23  0831     History  Chief Complaint  Patient presents with   [place]    Rachel Hines is a 89 y.o. female.  Patient presents with right clavicle injury since earlier today.  Patient was bending over today and she lifted up quickly causing her to feel brief dizziness and fall and hit her clavicle.  No head injury.  Patient is on blood thinner for A-fib.  No chest pain or shortness of breath.  Patient feels well except for pain in the shoulder and clavicle area.  No weakness.  Patient has history of breaking left clavicle.  The history is provided by the patient.  Fall Pertinent negatives include no chest pain, no abdominal pain, no headaches and no shortness of breath.       Home Medications Prior to Admission medications   Medication Sig Start Date End Date Taking? Authorizing Provider  acetaminophen  (TYLENOL ) 500 MG tablet Take 500 mg by mouth every 6 (six) hours as needed for mild pain or headache.   Yes [provider]  apixaban  (ELIQUIS ) 5 MG TABS tablet TAKE 1 TABLET BY MOUTH TWICE A DAY 07/20/23  Yes Waddell Danelle ORN, MD  atenolol  (TENORMIN ) 50 MG tablet TAKE ONE TABLET BY MOUTH EVERY MORNING 08/28/23  Yes Lesia Ozell Barter, PA-C  olmesartan  (BENICAR ) 40 MG tablet Take 1 tablet (40 mg total) by mouth daily. 06/03/23  Yes Lesia Ozell Barter, PA-C  oxyCODONE -acetaminophen  (PERCOCET/ROXICET) 5-325 MG tablet Take 1 tablet by mouth every 6 (six) hours as needed for severe pain (pain score 7-10). 10/21/23  Yes Tonia Chew, MD  rosuvastatin  (CRESTOR ) 5 MG tablet TAKE 1 TABLET BY MOUTH DAILY 09/15/23  Yes Waddell Danelle ORN, MD  zolpidem  (AMBIEN ) 10 MG tablet Take 10 mg by mouth at bedtime.   Yes [provider]      Allergies    Atorvastatin and Other    Review of Systems   Review of Systems  Constitutional:  Negative for chills and fever.   HENT:  Negative for congestion.   Eyes:  Negative for visual disturbance.  Respiratory:  Negative for shortness of breath.   Cardiovascular:  Negative for chest pain.  Gastrointestinal:  Negative for abdominal pain and vomiting.  Genitourinary:  Negative for dysuria and flank pain.  Musculoskeletal:  Positive for joint swelling. Negative for back pain, neck pain and neck stiffness.  Skin:  Positive for color change. Negative for rash.  Neurological:  Negative for headaches.    Physical Exam Updated Vital Signs BP (!) 178/92 (BP Location: Left Arm)   Pulse 82   Temp 97.9 F (36.6 C)   Resp 16   Ht 5' 3 (1.6 m)   Wt 68 kg   SpO2 96%   BMI 26.57 kg/m  Physical Exam Vitals and nursing note reviewed.  Constitutional:      General: She is not in acute distress.    Appearance: She is well-developed.  HENT:     Head: Normocephalic and atraumatic.     Mouth/Throat:     Mouth: Mucous membranes are moist.  Eyes:     General:        Right eye: No discharge.        Left eye: No discharge.     Conjunctiva/sclera: Conjunctivae normal.  Neck:     Trachea: No tracheal deviation.  Cardiovascular:     Rate  and Rhythm: Normal rate and regular rhythm.  Pulmonary:     Effort: Pulmonary effort is normal.     Breath sounds: Normal breath sounds.  Abdominal:     General: There is no distension.     Palpations: Abdomen is soft.     Tenderness: There is no abdominal tenderness. There is no guarding.  Musculoskeletal:        General: Swelling, tenderness and signs of injury present. No deformity.     Cervical back: Normal range of motion and neck supple. No rigidity or tenderness.     Comments: Patient has swelling ecchymosis mid anterior superior clavicle region.  Pain with palpation.  No focal shoulder or humerus or forearm tenderness on the right.  Pain with flexion radiating to clavicle area.  No left arm injuries.  No pain with flexion/extension of right or left lower extremities.   Skin:    General: Skin is warm.     Capillary Refill: Capillary refill takes less than 2 seconds.     Findings: Bruising present. No rash.  Neurological:     General: No focal deficit present.     Mental Status: She is alert.     Cranial Nerves: No cranial nerve deficit.  Psychiatric:        Mood and Affect: Mood normal.     ED Results / Procedures / Treatments   Labs (all labs ordered are listed, but only abnormal results are displayed) Labs Reviewed - No data to display  EKG None  Radiology DG Shoulder Right Result Date: 10/21/2023 CLINICAL DATA:  Fall, pain EXAM: RIGHT SHOULDER - 2+ VIEW; RIGHT CLAVICLE - 2+ VIEWS COMPARISON:  None Available. FINDINGS: Osteopenia. Mildly displaced, comminuted fracture of the middle third of the right clavicle. No fracture or dislocation of the right humerus or scapula. Mild acromioclavicular and glenohumeral arthrosis. Cardiomegaly. No obvious displaced rib fracture. IMPRESSION: 1. Mildly displaced, comminuted fracture of the middle third of the right clavicle. 2. No fracture or dislocation of the right humerus or scapula. 3. Mild acromioclavicular and glenohumeral arthrosis. 4. Osteopenia. Electronically Signed   By: Marolyn JONETTA Jaksch M.D.   On: 10/21/2023 09:20   DG Clavicle Right Result Date: 10/21/2023 CLINICAL DATA:  Fall, pain EXAM: RIGHT SHOULDER - 2+ VIEW; RIGHT CLAVICLE - 2+ VIEWS COMPARISON:  None Available. FINDINGS: Osteopenia. Mildly displaced, comminuted fracture of the middle third of the right clavicle. No fracture or dislocation of the right humerus or scapula. Mild acromioclavicular and glenohumeral arthrosis. Cardiomegaly. No obvious displaced rib fracture. IMPRESSION: 1. Mildly displaced, comminuted fracture of the middle third of the right clavicle. 2. No fracture or dislocation of the right humerus or scapula. 3. Mild acromioclavicular and glenohumeral arthrosis. 4. Osteopenia. Electronically Signed   By: Marolyn JONETTA Jaksch M.D.   On:  10/21/2023 09:20    Procedures Procedures    Medications Ordered in ED Medications  oxyCODONE  (Oxy IR/ROXICODONE ) immediate release tablet 5 mg (5 mg Oral Given 10/21/23 1111)    ED Course/ Medical Decision Making/ A&P                                 Medical Decision Making Amount and/or Complexity of Data Reviewed Radiology: ordered.  Risk Prescription drug management.   Patient with osteopenia history presents with clinical exam for clavicle fracture.  X-ray ordered independently reviewed showing clavicle fracture with mild displacement and comminuted.  Pain meds ordered, arm sling.  Follow-up  with orthopedic discussed with patient and daughter.  Patient has no other symptoms and had blood work recently and no history of anemia.  No indication for emergent blood work at this time.  No chest pain or shortness of breath or syncope.        Final Clinical Impression(s) / ED Diagnoses Final diagnoses:  Fall, initial encounter  Closed displaced fracture of right clavicle, unspecified part of clavicle, initial encounter    Rx / DC Orders ED Discharge Orders          Ordered    oxyCODONE -acetaminophen  (PERCOCET/ROXICET) 5-325 MG tablet  Every 6 hours PRN        10/21/23 1105              Lilana Blasko, MD 10/21/23 1123

## 2023-10-21 NOTE — ED Triage Notes (Signed)
 In for eval of right shoulder pain sec to fall backwards while bending over today at approx 40 minutes PTA. Large area of swelling and bruising noted to right clavicle area. On blood thinner. Denies hitting head. No neuro deficits.

## 2023-10-21 NOTE — ED Notes (Signed)
 Pt given discharge instructions and reviewed prescriptions. Opportunities given for questions. Pt verbalizes understanding. Jillyn Hidden, RN

## 2023-10-26 DIAGNOSIS — S42021A Displaced fracture of shaft of right clavicle, initial encounter for closed fracture: Secondary | ICD-10-CM | POA: Diagnosis not present

## 2023-11-10 DIAGNOSIS — R600 Localized edema: Secondary | ICD-10-CM | POA: Diagnosis not present

## 2023-11-10 DIAGNOSIS — E875 Hyperkalemia: Secondary | ICD-10-CM | POA: Diagnosis not present

## 2023-11-10 DIAGNOSIS — S42021D Displaced fracture of shaft of right clavicle, subsequent encounter for fracture with routine healing: Secondary | ICD-10-CM | POA: Diagnosis not present

## 2023-11-23 ENCOUNTER — Ambulatory Visit (INDEPENDENT_AMBULATORY_CARE_PROVIDER_SITE_OTHER): Payer: Medicare Other

## 2023-11-23 DIAGNOSIS — I639 Cerebral infarction, unspecified: Secondary | ICD-10-CM

## 2023-11-23 LAB — CUP PACEART REMOTE DEVICE CHECK
Date Time Interrogation Session: 20250309231756
Implantable Pulse Generator Implant Date: 20230907

## 2023-11-24 ENCOUNTER — Encounter: Payer: Self-pay | Admitting: Internal Medicine

## 2023-11-25 NOTE — Progress Notes (Signed)
 [facility] Summary Report / Loop Recorder

## 2023-12-21 ENCOUNTER — Other Ambulatory Visit: Payer: Self-pay

## 2023-12-21 MED ORDER — OLMESARTAN MEDOXOMIL 40 MG PO TABS: 40.0000 mg | ORAL_TABLET | Freq: Every day | ORAL | 0 refills | Status: DC

## 2023-12-28 NOTE — Progress Notes (Signed)
 [facility] Summary Report / Loop Recorder

## 2024-01-14 ENCOUNTER — Other Ambulatory Visit: Payer: Self-pay

## 2024-01-14 DIAGNOSIS — I4819 Other persistent atrial fibrillation: Secondary | ICD-10-CM

## 2024-01-14 MED ORDER — APIXABAN 5 MG PO TABS: 5.0000 mg | ORAL_TABLET | Freq: Two times a day (BID) | ORAL | 1 refills | Status: DC

## 2024-01-14 NOTE — Telephone Encounter (Signed)
 Prescription refill request for Eliquis  received. Indication: Afib  Last office visit: 03/03/23 Carolynne Citron)  Scr: 1.11 (12/03/22)  Age: 89 Weight: 68kg  Appropriate dose. Refill sent.

## 2024-02-01 ENCOUNTER — Ambulatory Visit: Payer: Self-pay | Admitting: Internal Medicine

## 2024-02-01 ENCOUNTER — Ambulatory Visit (INDEPENDENT_AMBULATORY_CARE_PROVIDER_SITE_OTHER): Payer: Medicare Other

## 2024-02-01 DIAGNOSIS — I639 Cerebral infarction, unspecified: Secondary | ICD-10-CM

## 2024-02-01 LAB — CUP PACEART REMOTE DEVICE CHECK
Date Time Interrogation Session: 20250518232501
Implantable Pulse Generator Implant Date: 20230907

## 2024-02-09 ENCOUNTER — Other Ambulatory Visit: Payer: Self-pay | Admitting: Student

## 2024-02-11 ENCOUNTER — Other Ambulatory Visit: Payer: Self-pay

## 2024-02-11 MED ORDER — ATENOLOL 50 MG PO TABS: 50.0000 mg | ORAL_TABLET | Freq: Every morning | ORAL | 0 refills | Status: DC

## 2024-02-24 ENCOUNTER — Other Ambulatory Visit: Payer: Self-pay | Admitting: Student

## 2024-03-03 ENCOUNTER — Ambulatory Visit: Payer: Self-pay | Admitting: Internal Medicine

## 2024-03-03 ENCOUNTER — Ambulatory Visit

## 2024-03-03 DIAGNOSIS — I639 Cerebral infarction, unspecified: Secondary | ICD-10-CM

## 2024-03-03 LAB — CUP PACEART REMOTE DEVICE CHECK
Date Time Interrogation Session: 20250618232356
Implantable Pulse Generator Implant Date: 20230907

## 2024-03-18 ENCOUNTER — Other Ambulatory Visit: Payer: Self-pay | Admitting: Internal Medicine

## 2024-03-21 NOTE — Progress Notes (Signed)
 [facility] Summary Report / Loop Recorder

## 2024-04-04 ENCOUNTER — Encounter

## 2024-04-07 DIAGNOSIS — Z1331 Encounter for screening for depression: Secondary | ICD-10-CM | POA: Diagnosis not present

## 2024-04-07 DIAGNOSIS — G4700 Insomnia, unspecified: Secondary | ICD-10-CM | POA: Diagnosis not present

## 2024-04-07 DIAGNOSIS — Z789 Other specified health status: Secondary | ICD-10-CM | POA: Diagnosis not present

## 2024-04-07 DIAGNOSIS — M129 Arthropathy, unspecified: Secondary | ICD-10-CM | POA: Diagnosis not present

## 2024-04-07 DIAGNOSIS — E7800 Pure hypercholesterolemia, unspecified: Secondary | ICD-10-CM | POA: Diagnosis not present

## 2024-04-07 DIAGNOSIS — Z95818 Presence of other cardiac implants and grafts: Secondary | ICD-10-CM | POA: Diagnosis not present

## 2024-04-07 DIAGNOSIS — I10 Essential (primary) hypertension: Secondary | ICD-10-CM | POA: Diagnosis not present

## 2024-04-07 DIAGNOSIS — E1169 Type 2 diabetes mellitus with other specified complication: Secondary | ICD-10-CM | POA: Diagnosis not present

## 2024-04-07 DIAGNOSIS — I679 Cerebrovascular disease, unspecified: Secondary | ICD-10-CM | POA: Diagnosis not present

## 2024-04-07 DIAGNOSIS — Z0000 Encounter for general adult medical examination without abnormal findings: Secondary | ICD-10-CM | POA: Diagnosis not present

## 2024-04-11 ENCOUNTER — Encounter

## 2024-05-05 ENCOUNTER — Encounter

## 2024-05-05 NOTE — Progress Notes (Signed)
 Remote Loop Recorder Transmission

## 2024-05-11 ENCOUNTER — Other Ambulatory Visit: Payer: Self-pay | Admitting: Internal Medicine

## 2024-05-17 ENCOUNTER — Encounter

## 2024-05-22 ENCOUNTER — Emergency Department (HOSPITAL_COMMUNITY)

## 2024-05-22 ENCOUNTER — Inpatient Hospital Stay (HOSPITAL_COMMUNITY)

## 2024-05-22 ENCOUNTER — Inpatient Hospital Stay (HOSPITAL_COMMUNITY)
Admission: EM | Admit: 2024-05-22 | Discharge: 2024-05-30 | DRG: 480 | Disposition: A | Discharge status: Skilled Nursing Facility | Attending: Family Medicine | Admitting: Family Medicine

## 2024-05-22 ENCOUNTER — Other Ambulatory Visit: Payer: Self-pay

## 2024-05-22 ENCOUNTER — Encounter (HOSPITAL_COMMUNITY): Payer: Self-pay | Admitting: Emergency Medicine

## 2024-05-22 DIAGNOSIS — E1165 Type 2 diabetes mellitus with hyperglycemia: Secondary | ICD-10-CM | POA: Diagnosis present

## 2024-05-22 DIAGNOSIS — G4700 Insomnia, unspecified: Secondary | ICD-10-CM | POA: Diagnosis not present

## 2024-05-22 DIAGNOSIS — Z9841 Cataract extraction status, right eye: Secondary | ICD-10-CM | POA: Diagnosis not present

## 2024-05-22 DIAGNOSIS — R1310 Dysphagia, unspecified: Secondary | ICD-10-CM | POA: Diagnosis not present

## 2024-05-22 DIAGNOSIS — Z9181 History of falling: Secondary | ICD-10-CM | POA: Diagnosis not present

## 2024-05-22 DIAGNOSIS — M810 Age-related osteoporosis without current pathological fracture: Secondary | ICD-10-CM | POA: Diagnosis present

## 2024-05-22 DIAGNOSIS — Z9889 Other specified postprocedural states: Secondary | ICD-10-CM | POA: Diagnosis not present

## 2024-05-22 DIAGNOSIS — Z8673 Personal history of transient ischemic attack (TIA), and cerebral infarction without residual deficits: Secondary | ICD-10-CM | POA: Diagnosis not present

## 2024-05-22 DIAGNOSIS — S42209A Unspecified fracture of upper end of unspecified humerus, initial encounter for closed fracture: Secondary | ICD-10-CM | POA: Diagnosis not present

## 2024-05-22 DIAGNOSIS — E559 Vitamin D deficiency, unspecified: Secondary | ICD-10-CM | POA: Diagnosis not present

## 2024-05-22 DIAGNOSIS — Z961 Presence of intraocular lens: Secondary | ICD-10-CM | POA: Diagnosis present

## 2024-05-22 DIAGNOSIS — N1831 Chronic kidney disease, stage 3a: Secondary | ICD-10-CM | POA: Diagnosis present

## 2024-05-22 DIAGNOSIS — Z7901 Long term (current) use of anticoagulants: Secondary | ICD-10-CM | POA: Diagnosis not present

## 2024-05-22 DIAGNOSIS — Z974 Presence of external hearing-aid: Secondary | ICD-10-CM | POA: Diagnosis not present

## 2024-05-22 DIAGNOSIS — W19XXXA Unspecified fall, initial encounter: Secondary | ICD-10-CM | POA: Diagnosis not present

## 2024-05-22 DIAGNOSIS — I129 Hypertensive chronic kidney disease with stage 1 through stage 4 chronic kidney disease, or unspecified chronic kidney disease: Secondary | ICD-10-CM | POA: Diagnosis not present

## 2024-05-22 DIAGNOSIS — S0990XA Unspecified injury of head, initial encounter: Secondary | ICD-10-CM | POA: Diagnosis not present

## 2024-05-22 DIAGNOSIS — R2681 Unsteadiness on feet: Secondary | ICD-10-CM | POA: Diagnosis not present

## 2024-05-22 DIAGNOSIS — M4802 Spinal stenosis, cervical region: Secondary | ICD-10-CM | POA: Diagnosis not present

## 2024-05-22 DIAGNOSIS — S72142D Displaced intertrochanteric fracture of left femur, subsequent encounter for closed fracture with routine healing: Secondary | ICD-10-CM | POA: Diagnosis not present

## 2024-05-22 DIAGNOSIS — I482 Chronic atrial fibrillation, unspecified: Secondary | ICD-10-CM | POA: Diagnosis not present

## 2024-05-22 DIAGNOSIS — I951 Orthostatic hypotension: Secondary | ICD-10-CM | POA: Diagnosis not present

## 2024-05-22 DIAGNOSIS — Z90710 Acquired absence of both cervix and uterus: Secondary | ICD-10-CM

## 2024-05-22 DIAGNOSIS — M25519 Pain in unspecified shoulder: Secondary | ICD-10-CM | POA: Diagnosis not present

## 2024-05-22 DIAGNOSIS — Z888 Allergy status to other drugs, medicaments and biological substances status: Secondary | ICD-10-CM | POA: Diagnosis not present

## 2024-05-22 DIAGNOSIS — H9190 Unspecified hearing loss, unspecified ear: Secondary | ICD-10-CM | POA: Diagnosis present

## 2024-05-22 DIAGNOSIS — E1122 Type 2 diabetes mellitus with diabetic chronic kidney disease: Secondary | ICD-10-CM | POA: Diagnosis not present

## 2024-05-22 DIAGNOSIS — R609 Edema, unspecified: Secondary | ICD-10-CM | POA: Diagnosis not present

## 2024-05-22 DIAGNOSIS — S42021A Displaced fracture of shaft of right clavicle, initial encounter for closed fracture: Secondary | ICD-10-CM | POA: Diagnosis not present

## 2024-05-22 DIAGNOSIS — S72142A Displaced intertrochanteric fracture of left femur, initial encounter for closed fracture: Principal | ICD-10-CM | POA: Diagnosis present

## 2024-05-22 DIAGNOSIS — Z79899 Other long term (current) drug therapy: Secondary | ICD-10-CM

## 2024-05-22 DIAGNOSIS — R41841 Cognitive communication deficit: Secondary | ICD-10-CM | POA: Diagnosis not present

## 2024-05-22 DIAGNOSIS — M6281 Muscle weakness (generalized): Secondary | ICD-10-CM | POA: Diagnosis not present

## 2024-05-22 DIAGNOSIS — S42202A Unspecified fracture of upper end of left humerus, initial encounter for closed fracture: Secondary | ICD-10-CM | POA: Diagnosis not present

## 2024-05-22 DIAGNOSIS — N179 Acute kidney failure, unspecified: Secondary | ICD-10-CM | POA: Diagnosis not present

## 2024-05-22 DIAGNOSIS — S42222A 2-part displaced fracture of surgical neck of left humerus, initial encounter for closed fracture: Secondary | ICD-10-CM | POA: Diagnosis not present

## 2024-05-22 DIAGNOSIS — E785 Hyperlipidemia, unspecified: Secondary | ICD-10-CM | POA: Diagnosis present

## 2024-05-22 DIAGNOSIS — W010XXA Fall on same level from slipping, tripping and stumbling without subsequent striking against object, initial encounter: Secondary | ICD-10-CM | POA: Diagnosis present

## 2024-05-22 DIAGNOSIS — S42212A Unspecified displaced fracture of surgical neck of left humerus, initial encounter for closed fracture: Secondary | ICD-10-CM | POA: Diagnosis not present

## 2024-05-22 DIAGNOSIS — Z96651 Presence of right artificial knee joint: Secondary | ICD-10-CM | POA: Diagnosis not present

## 2024-05-22 DIAGNOSIS — I10 Essential (primary) hypertension: Secondary | ICD-10-CM | POA: Diagnosis not present

## 2024-05-22 DIAGNOSIS — M1712 Unilateral primary osteoarthritis, left knee: Secondary | ICD-10-CM | POA: Diagnosis not present

## 2024-05-22 DIAGNOSIS — S42292A Other displaced fracture of upper end of left humerus, initial encounter for closed fracture: Secondary | ICD-10-CM | POA: Diagnosis not present

## 2024-05-22 DIAGNOSIS — S199XXA Unspecified injury of neck, initial encounter: Secondary | ICD-10-CM | POA: Diagnosis not present

## 2024-05-22 DIAGNOSIS — M8000XA Age-related osteoporosis with current pathological fracture, unspecified site, initial encounter for fracture: Secondary | ICD-10-CM | POA: Diagnosis not present

## 2024-05-22 DIAGNOSIS — S72002A Fracture of unspecified part of neck of left femur, initial encounter for closed fracture: Secondary | ICD-10-CM | POA: Diagnosis not present

## 2024-05-22 DIAGNOSIS — D62 Acute posthemorrhagic anemia: Secondary | ICD-10-CM | POA: Diagnosis present

## 2024-05-22 DIAGNOSIS — S7292XA Unspecified fracture of left femur, initial encounter for closed fracture: Secondary | ICD-10-CM | POA: Diagnosis not present

## 2024-05-22 DIAGNOSIS — S42032A Displaced fracture of lateral end of left clavicle, initial encounter for closed fracture: Secondary | ICD-10-CM | POA: Diagnosis not present

## 2024-05-22 DIAGNOSIS — H353132 Nonexudative age-related macular degeneration, bilateral, intermediate dry stage: Secondary | ICD-10-CM | POA: Diagnosis not present

## 2024-05-22 DIAGNOSIS — K219 Gastro-esophageal reflux disease without esophagitis: Secondary | ICD-10-CM | POA: Diagnosis not present

## 2024-05-22 DIAGNOSIS — S129XXA Fracture of neck, unspecified, initial encounter: Secondary | ICD-10-CM | POA: Diagnosis not present

## 2024-05-22 DIAGNOSIS — Z947 Corneal transplant status: Secondary | ICD-10-CM | POA: Diagnosis not present

## 2024-05-22 DIAGNOSIS — Z91018 Allergy to other foods: Secondary | ICD-10-CM

## 2024-05-22 DIAGNOSIS — Z7401 Bed confinement status: Secondary | ICD-10-CM | POA: Diagnosis not present

## 2024-05-22 DIAGNOSIS — H02403 Unspecified ptosis of bilateral eyelids: Secondary | ICD-10-CM | POA: Diagnosis not present

## 2024-05-22 DIAGNOSIS — M19012 Primary osteoarthritis, left shoulder: Secondary | ICD-10-CM | POA: Diagnosis not present

## 2024-05-22 DIAGNOSIS — S42202D Unspecified fracture of upper end of left humerus, subsequent encounter for fracture with routine healing: Secondary | ICD-10-CM | POA: Diagnosis not present

## 2024-05-22 DIAGNOSIS — R2689 Other abnormalities of gait and mobility: Secondary | ICD-10-CM | POA: Diagnosis not present

## 2024-05-22 LAB — COMPREHENSIVE METABOLIC PANEL WITH GFR
ALT: 15 U/L (ref 0–44)
AST: 21 U/L (ref 15–41)
Albumin: 3.4 g/dL — ABNORMAL LOW (ref 3.5–5.0)
Alkaline Phosphatase: 87 U/L (ref 38–126)
Anion gap: 11 (ref 5–15)
BUN: 23 mg/dL (ref 8–23)
CO2: 22 mmol/L (ref 22–32)
Calcium: 8.4 mg/dL — ABNORMAL LOW (ref 8.9–10.3)
Chloride: 106 mmol/L (ref 98–111)
Creatinine, Ser: 0.91 mg/dL (ref 0.44–1.00)
GFR, Estimated: 59 mL/min — ABNORMAL LOW (ref >60)
Glucose, Bld: 171 mg/dL — ABNORMAL HIGH (ref 70–99)
Potassium: 4.2 mmol/L (ref 3.5–5.1)
Sodium: 139 mmol/L (ref 135–145)
Total Bilirubin: 0.5 mg/dL (ref 0.0–1.2)
Total Protein: 5.5 g/dL — ABNORMAL LOW (ref 6.5–8.1)

## 2024-05-22 LAB — CBC WITH DIFFERENTIAL/PLATELET
Abs Immature Granulocytes: 0.05 K/uL (ref 0.00–0.07)
Basophils Absolute: 0 K/uL (ref 0.0–0.1)
Basophils Relative: 0 %
Eosinophils Absolute: 0 K/uL (ref 0.0–0.5)
Eosinophils Relative: 0 %
HCT: 29.7 % — ABNORMAL LOW (ref 36.0–46.0)
Hemoglobin: 9.1 g/dL — ABNORMAL LOW (ref 12.0–15.0)
Immature Granulocytes: 0 %
Lymphocytes Relative: 8 %
Lymphs Abs: 0.9 K/uL (ref 0.7–4.0)
MCH: 28.3 pg (ref 26.0–34.0)
MCHC: 30.6 g/dL (ref 30.0–36.0)
MCV: 92.5 fL (ref 80.0–100.0)
Monocytes Absolute: 0.7 K/uL (ref 0.1–1.0)
Monocytes Relative: 6 %
Neutro Abs: 10.6 K/uL — ABNORMAL HIGH (ref 1.7–7.7)
Neutrophils Relative %: 86 %
Platelets: 145 K/uL — ABNORMAL LOW (ref 150–400)
RBC: 3.21 MIL/uL — ABNORMAL LOW (ref 3.87–5.11)
RDW: 12.8 % (ref 11.5–15.5)
WBC: 12.3 K/uL — ABNORMAL HIGH (ref 4.0–10.5)
nRBC: 0 % (ref 0.0–0.2)

## 2024-05-22 LAB — TYPE AND SCREEN
ABO/RH(D): O POS
Antibody Screen: NEGATIVE

## 2024-05-22 LAB — GLUCOSE, CAPILLARY: Glucose-Capillary: 166 mg/dL — ABNORMAL HIGH (ref 70–99)

## 2024-05-22 LAB — ABO/RH: ABO/RH(D): O POS

## 2024-05-22 MED ORDER — HYDROMORPHONE HCL 1 MG/ML IJ SOLN
0.5000 mg | Freq: Once | INTRAMUSCULAR | Status: AC
  Administered 2024-05-22: 0.5 mg via INTRAVENOUS
  Filled 2024-05-22: qty 1

## 2024-05-22 MED ORDER — ACETAMINOPHEN 500 MG PO TABS: 1000.0000 mg | ORAL_TABLET | Freq: Two times a day (BID) | ORAL | Status: DC

## 2024-05-22 MED ORDER — ZOLPIDEM TARTRATE 5 MG PO TABS
5.0000 mg | ORAL_TABLET | Freq: Every day | ORAL | Status: DC
  Administered 2024-05-22 – 2024-05-29 (×8): 5 mg via ORAL
  Filled 2024-05-22 (×8): qty 1

## 2024-05-22 MED ORDER — INSULIN ASPART 100 UNIT/ML IJ SOLN
0.0000 [IU] | INTRAMUSCULAR | Status: DC
  Administered 2024-05-22: 2 [IU] via SUBCUTANEOUS
  Administered 2024-05-23: 1 [IU] via SUBCUTANEOUS
  Administered 2024-05-23 – 2024-05-24 (×3): 2 [IU] via SUBCUTANEOUS
  Filled 2024-05-22: qty 0.09

## 2024-05-22 MED ORDER — ACETAMINOPHEN 650 MG RE SUPP: 650.0000 mg | Freq: Four times a day (QID) | RECTAL | Status: DC | PRN

## 2024-05-22 MED ORDER — IRBESARTAN 300 MG PO TABS
300.0000 mg | ORAL_TABLET | Freq: Every day | ORAL | Status: DC
  Filled 2024-05-22: qty 1

## 2024-05-22 MED ORDER — SODIUM CHLORIDE 0.9 % IV SOLN: INTRAVENOUS | Status: AC

## 2024-05-22 MED ORDER — ACETAMINOPHEN 500 MG PO TABS
1000.0000 mg | ORAL_TABLET | Freq: Three times a day (TID) | ORAL | Status: DC
  Administered 2024-05-22 – 2024-05-29 (×18): 1000 mg via ORAL
  Filled 2024-05-22 (×20): qty 2

## 2024-05-22 MED ORDER — BISACODYL 5 MG PO TBEC
5.0000 mg | DELAYED_RELEASE_TABLET | Freq: Every day | ORAL | Status: DC | PRN
  Filled 2024-05-22: qty 1

## 2024-05-22 MED ORDER — ONDANSETRON HCL 4 MG PO TABS: 4.0000 mg | ORAL_TABLET | Freq: Four times a day (QID) | ORAL | Status: DC | PRN

## 2024-05-22 MED ORDER — ACETAMINOPHEN 325 MG PO TABS
650.0000 mg | ORAL_TABLET | Freq: Four times a day (QID) | ORAL | Status: DC | PRN
Start: 2024-05-22 — End: 2024-05-22

## 2024-05-22 MED ORDER — ATENOLOL 50 MG PO TABS
50.0000 mg | ORAL_TABLET | Freq: Every morning | ORAL | Status: DC
  Filled 2024-05-22 (×2): qty 1

## 2024-05-22 MED ORDER — MORPHINE SULFATE (PF) 2 MG/ML IV SOLN
2.0000 mg | Freq: Once | INTRAVENOUS | Status: AC
  Administered 2024-05-22: 2 mg via INTRAVENOUS
  Filled 2024-05-22: qty 1

## 2024-05-22 MED ORDER — SENNOSIDES-DOCUSATE SODIUM 8.6-50 MG PO TABS: 1.0000 | ORAL_TABLET | Freq: Every evening | ORAL | Status: DC | PRN

## 2024-05-22 MED ORDER — ONDANSETRON HCL 4 MG/2ML IJ SOLN
4.0000 mg | Freq: Once | INTRAMUSCULAR | Status: AC
  Administered 2024-05-22: 4 mg via INTRAVENOUS
  Filled 2024-05-22: qty 2

## 2024-05-22 MED ORDER — CEFAZOLIN SODIUM-DEXTROSE 2-4 GM/100ML-% IV SOLN
2.0000 g | INTRAVENOUS | Status: DC
  Filled 2024-05-22: qty 100

## 2024-05-22 MED ORDER — ROSUVASTATIN CALCIUM 5 MG PO TABS
5.0000 mg | ORAL_TABLET | Freq: Every day | ORAL | Status: DC
  Administered 2024-05-24 – 2024-05-29 (×6): 5 mg via ORAL
  Filled 2024-05-22 (×6): qty 1

## 2024-05-22 MED ORDER — HYDROMORPHONE HCL 1 MG/ML IJ SOLN
0.5000 mg | Freq: Four times a day (QID) | INTRAMUSCULAR | Status: DC | PRN
  Administered 2024-05-22 – 2024-05-23 (×3): 0.5 mg via INTRAVENOUS
  Filled 2024-05-22: qty 1
  Filled 2024-05-22 (×2): qty 0.5

## 2024-05-22 MED ORDER — ONDANSETRON HCL 4 MG/2ML IJ SOLN
4.0000 mg | Freq: Four times a day (QID) | INTRAMUSCULAR | Status: DC | PRN
  Administered 2024-05-22 – 2024-05-29 (×4): 4 mg via INTRAVENOUS
  Filled 2024-05-22 (×5): qty 2

## 2024-05-22 NOTE — Progress Notes (Signed)
 Consult request received for left hip fracture and left proximal humerus fracture. Have discussed with the requesting MD patient's stability, pain control, and ongoing work up. I have reviewed x-rays and developed a provisional plan.   I attempted to find surgeon and OR availability there at [facility] tomorrow but was unsuccessful. Consequently decision made to transfer to [facility] with plans for IMN of the hip as a 3rd case. Currently planning nonoperative treatment of the humerus.  Full consultation to follow in the am. Appreciate Hospitalist Service.  Rachel Bruch, MD [facility], [facility] [phone]

## 2024-05-22 NOTE — ED Triage Notes (Signed)
 Pt bib EMS from home for fall. Pt on eliquis . Pt reports pain in left hip and shoulder. Per EMS obvious deformity in hip and shoulder. Fall unwitnessed but pt stating she did not hit head.

## 2024-05-22 NOTE — Plan of Care (Signed)

## 2024-05-22 NOTE — H&P (Addendum)
 History and Physical  Rachel Hines FMW:[id number] DOB: [birth year] DOA: 05/22/2024  PCP: Claudene Pellet, MD   Chief Complaint: Fall  HPI: Rachel Hines is a 89 y.o. female with medical history significant for A-fib on Eliquis , CVA, T2DM, HTN, HLD, arthritis and CKD 3A who presents to the ED for evaluation of left hip and shoulder pain after a fall. Patient reports she was attempting to reach for a spray in the family room when her shoes tangled up causing her to trip and fall on her left side.  She was unable to get up right away but was able to crawl to the phone at some point to call her granddaughter.  She endorsed mild nausea but denies any dizziness, headache, vision changes, chest pain, shortness of breath, palpitations, abdominal pain, vomiting, fevers, chills or recent fall. Per daughter, patient's last fall was a year ago when she was found to have left clavicle fracture.  ED Course: Initial vitals show patient afebrile and normotensive. Initial labs significant for glucose 171, creatinine 0.91, WBC 12.3, Hgb 9.1,. EKG shows A-fib with slightly prolonged QTc of 490. CXR shows no acute cardiopulmonary disease.  X-ray of the left shoulder shows acute mildly impacted fracture of the proximal left humerus and old left clavicle fracture.  X-ray of the pelvis shows acute, comminuted fracture of the proximal left femur.  CT head and CT cervical spine pending. Pt received IV Zofran , IV morphine  and IV Dilaudid .  Orthopedic surgery was consulted for evaluation. TR[facility] was consulted for admission.   Review of Systems: Please see HPI for pertinent positives and negatives. A complete 10 system review of systems are otherwise negative.  Past Medical History:  Diagnosis Date   Acute meniscal injury of left knee    Arthritis    Atrial fibrillation (HCC)    GERD (gastroesophageal reflux disease)    H/O hiatal hernia    Heart block AV first degree    History of esophageal dilatation    Hyperlipidemia     Hypertension    Impaired hearing    Left knee pain    severe   Type 2 diabetes mellitus (HCC)    Wears hearing aid    Past Surgical History:  Procedure Laterality Date   ABDOMINAL HYSTERECTOMY  1960'S   CATARACT EXTRACTION W/ INTRAOCULAR LENS IMPLANT Right    CHONDROPLASTY  04/14/2013   Procedure:  ABRATION CHONDROPLASTY OF MEDIAL CHONDIAL;  Surgeon: Tanda DELENA Heading, MD;  Location: [facility];  Service: Orthopedics;;   KNEE ARTHROSCOPY WITH LATERAL MENISECTOMY Left 04/14/2013   Procedure: KNEE ARTHROSCOPY WITH LATERAL MENISECTOMY;  Surgeon: Tanda DELENA Heading, MD;  Location: [facility];  Service: Orthopedics;  Laterality: Left;   KNEE ARTHROSCOPY WITH MEDIAL MENISECTOMY Left 04/14/2013   Procedure: LEFT KNEE ARTHROSCOPY WITH MEDIAL MENISECTOMY;  Surgeon: Tanda DELENA Heading, MD;  Location: [facility];  Service: Orthopedics;  Laterality: Left;   TOTAL KNEE ARTHROPLASTY Right 2011   Social History:  reports that she has never smoked. She has never used smokeless tobacco. She reports that she does not drink alcohol and does not use drugs.  Allergies  Allergen Reactions   Atorvastatin Other (See Comments)    Severe muscle pain and bone pain   Other Rash and Other (See Comments)    Food dyes- Rashes and joint pain    History reviewed. No pertinent family history.   Prior to Admission medications   Medication Sig Start Date End Date Taking? Authorizing  Provider  acetaminophen  (TYLENOL ) 500 MG tablet Take 500 mg by mouth every 6 (six) hours as needed for mild pain or headache.    [provider]  apixaban  (ELIQUIS ) 5 MG TABS tablet Take 1 tablet (5 mg total) by mouth 2 (two) times daily. 01/14/24   Waddell Danelle ORN, MD  atenolol  (TENORMIN ) 50 MG tablet TAKE ONE TABLET BY MOUTH EVERY MORNING 02/26/24   Waddell Danelle ORN, MD  olmesartan  (BENICAR ) 40 MG tablet TAKE 1 TABLET BY MOUTH DAILY 03/21/24   Waddell Danelle ORN, MD  oxyCODONE -acetaminophen   (PERCOCET/ROXICET) 5-325 MG tablet Take 1 tablet by mouth every 6 (six) hours as needed for severe pain (pain score 7-10). 10/21/23   Tonia Chew, MD  rosuvastatin  (CRESTOR ) 5 MG tablet TAKE 1 TABLET BY MOUTH DAILY 09/15/23   Waddell Danelle ORN, MD  zolpidem  (AMBIEN ) 10 MG tablet Take 10 mg by mouth at bedtime.    [provider]    Physical Exam: BP 134/75 (BP Location: Right Arm)   Pulse 78   Temp 98.1 F (36.7 C) (Oral)   Resp 16   Ht 5' 3 (1.6 m)   Wt 68 kg   SpO2 100%   BMI 26.57 kg/m  General: Pleasant, well-appearing elderly woman laying in bed. No acute distress. HEENT: [place]/AT. Anicteric sclera.  CV: Regular rate. Irregular rhythm. No murmurs, rubs, or gallops. No LE edema Pulmonary: Lungs CTAB. Normal effort. No wheezing or rales. Abdominal: Soft, nontender, nondistended. Normal bowel sounds. MSK: Diffuse tenderness palpation of the left proximal humerus.  No deformities or pain to the clavicle or scapula. Neurovascular intact distally. Mild ttp of the left hip and groin, LLE shortened with external rotation. Neurovascular intact distally. Skin: Warm and dry. No obvious rash or lesions. Decreased skin turgor. Neuro: A&Ox3. Moves all extremities. Normal sensation to light touch. No focal deficit. Psych: Normal mood and affect          Labs on Admission:  Basic Metabolic Panel: Recent Labs  Lab 05/22/24 1648  NA 139  K 4.2  CL 106  CO2 22  GLUCOSE 171*  BUN 23  CREATININE 0.91  CALCIUM  8.4*   Liver Function Tests: Recent Labs  Lab 05/22/24 1648  AST 21  ALT 15  ALKPHOS 87  BILITOT 0.5  PROT 5.5*  ALBUMIN 3.4*   No results for input(s): LIPASE, AMYLASE in the last 168 hours. No results for input(s): AMMONIA in the last 168 hours. CBC: Recent Labs  Lab 05/22/24 1648  WBC 12.3*  NEUTROABS 10.6*  HGB 9.1*  HCT 29.7*  MCV 92.5  PLT 145*   Cardiac Enzymes: No results for input(s): CKTOTAL, CKMB, CKMBINDEX, TROPONINI in the last  168 hours. BNP (last 3 results) No results for input(s): BNP in the last 8760 hours.  ProBNP (last 3 results) No results for input(s): PROBNP in the last 8760 hours.  CBG: No results for input(s): GLUCAP in the last 168 hours.  Radiological Exams on Admission: CT Cervical Spine Wo Contrast Result Date: 05/22/2024 CLINICAL DATA:  Neck trauma (Age >= 65y).  Fall. EXAM: CT CERVICAL SPINE WITHOUT CONTRAST TECHNIQUE: Multidetector CT imaging of the cervical spine was performed without intravenous contrast. Multiplanar CT image reconstructions were also generated. RADIATION DOSE REDUCTION: This exam was performed according to the departmental dose-optimization program which includes automated exposure control, adjustment of the mA and/or kV according to patient size and/or use of iterative reconstruction technique. COMPARISON:  None Available. FINDINGS: Alignment: No subluxation Skull base and  vertebrae: No acute fracture. No primary bone lesion or focal pathologic process. Soft tissues and spinal canal: No prevertebral fluid or swelling. No visible canal hematoma. Disc levels: Early disc space narrowing throughout the cervical spine. Mild to moderate bilateral degenerative facet disease. Upper chest: Trace left pleural effusion partially visualized. Other: None IMPRESSION: No acute bony abnormality. Electronically Signed   By: Franky Crease M.D.   On: 05/22/2024 19:16   CT Head Wo Contrast Result Date: 05/22/2024 CLINICAL DATA:  Head trauma, minor (Age >= 65y).  Fall. EXAM: CT HEAD WITHOUT CONTRAST TECHNIQUE: Contiguous axial images were obtained from the base of the skull through the vertex without intravenous contrast. RADIATION DOSE REDUCTION: This exam was performed according to the departmental dose-optimization program which includes automated exposure control, adjustment of the mA and/or kV according to patient size and/or use of iterative reconstruction technique. COMPARISON:  04/25/2022  FINDINGS: Brain: Old left occipital infarct. There is atrophy and chronic small vessel disease changes. No acute intracranial abnormality. Specifically, no hemorrhage, hydrocephalus, mass lesion, acute infarction, or significant intracranial injury. Vascular: No hyperdense vessel or unexpected calcification. Skull: No acute calvarial abnormality. Sinuses/Orbits: No acute findings Other: None IMPRESSION: Atrophy, chronic microvascular disease. No acute intracranial abnormality. Old left occipital infarct. Electronically Signed   By: Franky Crease M.D.   On: 05/22/2024 19:14   DG Chest 1 View Result Date: 05/22/2024 CLINICAL DATA:  Status post fall. EXAM: CHEST  1 VIEW COMPARISON:  None Available. FINDINGS: The cardiac silhouette is enlarged. Both lungs are clear. There is an acute fracture deformity involving the head and neck of the proximal left humerus. An additional fracture of indeterminate age is seen involving the mid to distal left clavicle. A chronic fracture of the mid right clavicle is noted. IMPRESSION: 1. Acute fracture of the proximal left humerus. 2. Fracture of indeterminate age involving the mid to distal left clavicle. 3. Chronic fracture of the mid right clavicle. Electronically Signed   By: Suzen Dials M.D.   On: 05/22/2024 17:50   DG Hip Unilat W or Wo Pelvis 2-3 Views Left Result Date: 05/22/2024 CLINICAL DATA:  Status post fall. EXAM: DG HIP (WITH OR WITHOUT PELVIS) 2-3V LEFT COMPARISON:  None Available. FINDINGS: An acute, comminuted fracture deformity is seen extending through the inter trochanteric region of the proximal left femur. Mild displacement of the lesser trochanter is seen. There is no evidence of dislocation. Soft tissue structures are unremarkable. IMPRESSION: Acute, comminuted fracture of the proximal left femur. Electronically Signed   By: Suzen Dials M.D.   On: 05/22/2024 17:48   DG Shoulder Left Result Date: 05/22/2024 CLINICAL DATA:  Status post fall. EXAM:  LEFT SHOULDER - 2+ VIEW COMPARISON:  None Available. FINDINGS: And acute mildly impacted fracture deformity is seen involving the head and neck of the proximal left humerus. An ill-defined deformity of indeterminate age is also seen involving the mid to distal left clavicle. There is no evidence of dislocation. Degenerative changes seen involving the left glenohumeral articulation. Diffuse soft tissue swelling is present. IMPRESSION: 1. Acute mildly impacted fracture of the proximal left humerus. 2. Findings which may represent a fracture of the mid to distal left clavicle of indeterminate age. Correlation with physical examination is recommended to determine the presence of point tenderness. Electronically Signed   By: Suzen Dials M.D.   On: 05/22/2024 17:46   Assessment/Plan Rachel Hines is a 89 y.o. female with medical history significant for  A-fib on Eliquis , CVA, T2DM, HTN,  HLD, arthritis and CKD 3A who presents to the ED for evaluation of left hip and shoulder pain after a fall and found to have left hip and left humerus fractures.   # Mechanical fall # Left proximal hip fracture # Left proximal humerus fracture - Patient presented with left hip and humerus pain after tripping and and falling on her left side at home - X-ray of the left shoulder shows acute mildly impacted fracture of the proximal left humerus and X-ray of the pelvis shows acute, comminuted fracture of the proximal left femur. - Orthopedic surgery consulted, recommend transfer to [facility] with plan for IM nailing of the left hip and nonoperative treatment of the left humerus - Continue left shoulder immobilizer - Pain control with scheduled Tylenol  and as needed IV Dilaudid  - Check vitamin D  levels - N.p.o. at midnight, will start on IVNS 100 cc/hr infusion while NPO - Fall precautions  # T2DM with hyperglycemia - Last A1c 5.6% 2 years ago, not on any antidiabetes medications - Blood glucose of 171 on CMP - Q4H SS with  CBG monitoring while NPO - Follow-up repeat A1c  # A-fib  - EKG shows A-fib - HR stable in the 50s to 70s - Hold Eliquis  pending surgery tomorrow, last Eliquis  dose 6 PM on 9/6 - Continue atenolol  - Telemetry  # HTN - BP stable with SBP in the 110s to 130s - Continue atenolol  and olmesartan  (Irbesartan  as substitute)  # HLD - Continue rosuvastatin   # Insomnia - Continue Ambien  at bedtime  DVT prophylaxis: SCDs    Code Status: Full Code  Consults called: Orthopedic surgery  Family Communication: Discussed admission with daughter at bedside  Severity of Illness: The appropriate patient status for this patient is INPATIENT. Inpatient status is judged to be reasonable and necessary in order to provide the required intensity of service to ensure the patient's safety. The patient's presenting symptoms, physical exam findings, and initial radiographic and laboratory data in the context of their chronic comorbidities is felt to place them at high risk for further clinical deterioration. Furthermore, it is not anticipated that the patient will be medically stable for discharge from the hospital within 2 midnights of admission.   * I certify that at the point of admission it is my clinical judgment that the patient will require inpatient hospital care spanning beyond 2 midnights from the point of admission due to high intensity of service, high risk for further deterioration and high frequency of surveillance required.*  Level of care: Telemetry Medical   This record has been created using Conservation officer, historic buildings. Errors have been sought and corrected, but may not always be located. Such creation errors do not reflect on the standard of care.   Lou Claretta HERO, MD 05/22/2024, 8:05 PM [facility] Pager: [phone] Isaiah 41:10   If 7PM-7AM, please contact night-coverage [email or web address] Password [email or web address]

## 2024-05-22 NOTE — Progress Notes (Signed)
 Orthopedic Tech Progress Note Patient Details:  Rachel Hines [birth year] [id number]  Musculoskeletal Traction Type of Traction: [place] Skin Traction Traction Location: lle Traction Weight: 10 lbs   Post Interventions Patient Tolerated: Well Instructions Provided: Care of device, Adjustment of device  Chandra Dorn PARAS 05/22/2024, 9:26 PM

## 2024-05-22 NOTE — ED Provider Notes (Signed)
 [place] EMERGENCY DEPARTMENT AT [facility] Provider Note   CSN: [id number] Arrival date & time: 05/22/24  1611     Patient presents with: Rachel Hines is a 89 y.o. female.   89 yo F with a chief complaints of a fall.  She states she got her feet tangled up and she fell onto her left side.  Complaining of left shoulder and left hip pain.  She denies headache denies head injury denies neck pain.  Denies chest pain difficulty breathing or abdominal pain.          Prior to Admission medications   Medication Sig Start Date End Date Taking? Authorizing Provider  acetaminophen  (TYLENOL ) 500 MG tablet Take 500 mg by mouth every 6 (six) hours as needed for mild pain or headache.    [provider]  apixaban  (ELIQUIS ) 5 MG TABS tablet Take 1 tablet (5 mg total) by mouth 2 (two) times daily. 01/14/24   Waddell Danelle ORN, MD  atenolol  (TENORMIN ) 50 MG tablet TAKE ONE TABLET BY MOUTH EVERY MORNING 02/26/24   Waddell Danelle ORN, MD  olmesartan  (BENICAR ) 40 MG tablet TAKE 1 TABLET BY MOUTH DAILY 03/21/24   Waddell Danelle ORN, MD  oxyCODONE -acetaminophen  (PERCOCET/ROXICET) 5-325 MG tablet Take 1 tablet by mouth every 6 (six) hours as needed for severe pain (pain score 7-10). 10/21/23   Tonia Chew, MD  rosuvastatin  (CRESTOR ) 5 MG tablet TAKE 1 TABLET BY MOUTH DAILY 09/15/23   Waddell Danelle ORN, MD  zolpidem  (AMBIEN ) 10 MG tablet Take 10 mg by mouth at bedtime.    [provider]    Allergies: Atorvastatin and Other    Review of Systems  Updated Vital Signs BP 134/75 (BP Location: Right Arm)   Pulse 78   Temp 98.1 F (36.7 C) (Oral)   Resp 16   Ht 5' 3 (1.6 m)   Wt 68 kg   SpO2 100%   BMI 26.57 kg/m   Physical Exam Vitals and nursing note reviewed.  Constitutional:      General: She is not in acute distress.    Appearance: She is well-developed. She is not diaphoretic.  HENT:     Head: Normocephalic and atraumatic.  Eyes:     Pupils: Pupils are  equal, round, and reactive to light.  Cardiovascular:     Rate and Rhythm: Normal rate and regular rhythm.     Heart sounds: No murmur heard.    No friction rub. No gallop.  Pulmonary:     Effort: Pulmonary effort is normal.     Breath sounds: No wheezing or rales.  Abdominal:     General: There is no distension.     Palpations: Abdomen is soft.     Tenderness: There is no abdominal tenderness.  Musculoskeletal:        General: Swelling and tenderness present.     Cervical back: Normal range of motion and neck supple.     Comments: Pain diffusely about the left proximal humerus.  No obvious pain about the clavicle or scapula.  No pain at the elbow.  Pulse motor and sensation intact distally.  Patient with pain mostly in the left groin.  Obvious shortening of the left lower extremity.  Pain with internal/external rotation.  No obvious pelvic instability.  Pulse motor and sensation to the left lower extremity intact.  Palpated from head to toe without any other obvious noted areas of bony tenderness.  Skin:    General:  Skin is warm and dry.  Neurological:     Mental Status: She is alert and oriented to person, place, and time.  Psychiatric:        Behavior: Behavior normal.     (all labs ordered are listed, but only abnormal results are displayed) Labs Reviewed  CBC WITH DIFFERENTIAL/PLATELET - Abnormal; Notable for the following components:      Result Value   WBC 12.3 (*)    RBC 3.21 (*)    Hemoglobin 9.1 (*)    HCT 29.7 (*)    Platelets 145 (*)    Neutro Abs 10.6 (*)    All other components within normal limits  COMPREHENSIVE METABOLIC PANEL WITH GFR - Abnormal; Notable for the following components:   Glucose, Bld 171 (*)    Calcium  8.4 (*)    Total Protein 5.5 (*)    Albumin 3.4 (*)    GFR, Estimated 59 (*)    All other components within normal limits  TYPE AND SCREEN  ABO/RH    EKG: EKG Interpretation Date/Time:  Sunday May 22 2024 16:44:08  EDT Ventricular Rate:  89 PR Interval:  173 QRS Duration:  93 QT Interval:  402 QTC Calculation: 490 R Axis:   22  Text Interpretation: Atrial fibrillation Low voltage, precordial leads Borderline prolonged QT interval Otherwise no significant change Confirmed by Emil Share [phone]) on 05/22/2024 4:48:38 PM  Radiology: [facility] Chest 1 View Result Date: 05/22/2024 CLINICAL DATA:  Status post fall. EXAM: CHEST  1 VIEW COMPARISON:  None Available. FINDINGS: The cardiac silhouette is enlarged. Both lungs are clear. There is an acute fracture deformity involving the head and neck of the proximal left humerus. An additional fracture of indeterminate age is seen involving the mid to distal left clavicle. A chronic fracture of the mid right clavicle is noted. IMPRESSION: 1. Acute fracture of the proximal left humerus. 2. Fracture of indeterminate age involving the mid to distal left clavicle. 3. Chronic fracture of the mid right clavicle. Electronically Signed   By: Suzen Dials M.D.   On: 05/22/2024 17:50   DG Hip Unilat W or Wo Pelvis 2-3 Views Left Result Date: 05/22/2024 CLINICAL DATA:  Status post fall. EXAM: DG HIP (WITH OR WITHOUT PELVIS) 2-3V LEFT COMPARISON:  None Available. FINDINGS: An acute, comminuted fracture deformity is seen extending through the inter trochanteric region of the proximal left femur. Mild displacement of the lesser trochanter is seen. There is no evidence of dislocation. Soft tissue structures are unremarkable. IMPRESSION: Acute, comminuted fracture of the proximal left femur. Electronically Signed   By: Suzen Dials M.D.   On: 05/22/2024 17:48   DG Shoulder Left Result Date: 05/22/2024 CLINICAL DATA:  Status post fall. EXAM: LEFT SHOULDER - 2+ VIEW COMPARISON:  None Available. FINDINGS: And acute mildly impacted fracture deformity is seen involving the head and neck of the proximal left humerus. An ill-defined deformity of indeterminate age is also seen involving the mid to  distal left clavicle. There is no evidence of dislocation. Degenerative changes seen involving the left glenohumeral articulation. Diffuse soft tissue swelling is present. IMPRESSION: 1. Acute mildly impacted fracture of the proximal left humerus. 2. Findings which may represent a fracture of the mid to distal left clavicle of indeterminate age. Correlation with physical examination is recommended to determine the presence of point tenderness. Electronically Signed   By: Suzen Dials M.D.   On: 05/22/2024 17:46     Procedures   Medications Ordered in the ED  morphine  (PF) 2 MG/ML injection 2 mg (2 mg Intravenous Given 05/22/24 1645)  ondansetron  (ZOFRAN ) injection 4 mg (4 mg Intravenous Given 05/22/24 1647)  HYDROmorphone  (DILAUDID ) injection 0.5 mg (0.5 mg Intravenous Given 05/22/24 1754)                                    Medical Decision Making Amount and/or Complexity of Data Reviewed Labs: ordered. Radiology: ordered. ECG/medicine tests: ordered.  Risk Prescription drug management.   89 yo F with a chief complaints of a fall.  Nonsyncopal by history complaining mostly of left hip and left shoulder pain.  Will obtain plain films.  Attempted control pain.  Blood work.  Plain film of the left hip on my independent interpretation with the intertrochanteric fracture, plain film of the left shoulder on my independent interpretation with humeral neck fracture.  No obvious dislocation.  Patient continued to have pain worse in the shoulder than the leg.  Will redose pain medicine here.  Blood work with some drop in hemoglobin the last check a couple years ago.  No significant electrolyte abnormalities.  Chest x-ray independently interpreted by me without focal infiltrate or pneumothorax.  Will discuss with Ortho, will discuss with medicine.  I discussed case with Dr. Celena.  Agreed with medical admission.  Likely nonop for the humeral fracture but will need surgery for the left  hip.  Patient notified nursing that she started having pain in her neck.  Will obtain CT head and C-spine.  The patients results and plan were reviewed and discussed.   Any x-rays performed were independently reviewed by myself.   Differential diagnosis were considered with the presenting HPI.  Medications  morphine  (PF) 2 MG/ML injection 2 mg (2 mg Intravenous Given 05/22/24 1645)  ondansetron  (ZOFRAN ) injection 4 mg (4 mg Intravenous Given 05/22/24 1647)  HYDROmorphone  (DILAUDID ) injection 0.5 mg (0.5 mg Intravenous Given 05/22/24 1754)    Vitals:   05/22/24 1624 05/22/24 1629  BP: 134/75   Pulse: 78   Resp: 16   Temp: 98.1 F (36.7 C)   TempSrc: Oral   SpO2: 100%   Weight:  68 kg  Height: 5' 3 (1.6 m) 5' 3 (1.6 m)    Final diagnoses:  Closed displaced intertrochanteric fracture of left femur, initial encounter (HCC)  Closed 2-part displaced fracture of surgical neck of left humerus, initial encounter  Fall, initial encounter    Admission/ observation were discussed with the admitting physician, patient and/or family and they are comfortable with the plan.       Final diagnoses:  Closed displaced intertrochanteric fracture of left femur, initial encounter (HCC)  Closed 2-part displaced fracture of surgical neck of left humerus, initial encounter  Fall, initial encounter    ED Discharge Orders     None          Emil Share, DO 05/22/24 1825

## 2024-05-23 ENCOUNTER — Inpatient Hospital Stay (HOSPITAL_COMMUNITY): Admitting: Anesthesiology

## 2024-05-23 ENCOUNTER — Encounter (HOSPITAL_COMMUNITY): Payer: Self-pay | Admitting: Student

## 2024-05-23 ENCOUNTER — Other Ambulatory Visit: Payer: Self-pay

## 2024-05-23 ENCOUNTER — Inpatient Hospital Stay (HOSPITAL_COMMUNITY)

## 2024-05-23 ENCOUNTER — Encounter (HOSPITAL_COMMUNITY)
Admission: EM | Disposition: A | Discharge status: Skilled Nursing Facility | Payer: Self-pay | Source: Home / Self Care | Attending: Family Medicine

## 2024-05-23 DIAGNOSIS — N1831 Chronic kidney disease, stage 3a: Secondary | ICD-10-CM | POA: Diagnosis not present

## 2024-05-23 DIAGNOSIS — I482 Chronic atrial fibrillation, unspecified: Secondary | ICD-10-CM | POA: Diagnosis not present

## 2024-05-23 DIAGNOSIS — E1122 Type 2 diabetes mellitus with diabetic chronic kidney disease: Secondary | ICD-10-CM

## 2024-05-23 DIAGNOSIS — S72142A Displaced intertrochanteric fracture of left femur, initial encounter for closed fracture: Secondary | ICD-10-CM

## 2024-05-23 LAB — VITAMIN D 25 HYDROXY (VIT D DEFICIENCY, FRACTURES): Vit D, 25-Hydroxy: 8.69 ng/mL — ABNORMAL LOW (ref 30–100)

## 2024-05-23 LAB — BASIC METABOLIC PANEL WITH GFR
Anion gap: 13 (ref 5–15)
BUN: 31 mg/dL — ABNORMAL HIGH (ref 8–23)
CO2: 22 mmol/L (ref 22–32)
Calcium: 8.2 mg/dL — ABNORMAL LOW (ref 8.9–10.3)
Chloride: 105 mmol/L (ref 98–111)
Creatinine, Ser: 1.44 mg/dL — ABNORMAL HIGH (ref 0.44–1.00)
GFR, Estimated: 34 mL/min — ABNORMAL LOW (ref >60)
Glucose, Bld: 156 mg/dL — ABNORMAL HIGH (ref 70–99)
Potassium: 5.1 mmol/L (ref 3.5–5.1)
Sodium: 140 mmol/L (ref 135–145)

## 2024-05-23 LAB — GLUCOSE, CAPILLARY
Glucose-Capillary: 113 mg/dL — ABNORMAL HIGH (ref 70–99)
Glucose-Capillary: 122 mg/dL — ABNORMAL HIGH (ref 70–99)
Glucose-Capillary: 148 mg/dL — ABNORMAL HIGH (ref 70–99)
Glucose-Capillary: 151 mg/dL — ABNORMAL HIGH (ref 70–99)
Glucose-Capillary: 161 mg/dL — ABNORMAL HIGH (ref 70–99)
Glucose-Capillary: 164 mg/dL — ABNORMAL HIGH (ref 70–99)

## 2024-05-23 LAB — CBC
HCT: 26.5 % — ABNORMAL LOW (ref 36.0–46.0)
Hemoglobin: 8.3 g/dL — ABNORMAL LOW (ref 12.0–15.0)
MCH: 28.8 pg (ref 26.0–34.0)
MCHC: 31.3 g/dL (ref 30.0–36.0)
MCV: 92 fL (ref 80.0–100.0)
Platelets: 176 K/uL (ref 150–400)
RBC: 2.88 MIL/uL — ABNORMAL LOW (ref 3.87–5.11)
RDW: 13.1 % (ref 11.5–15.5)
WBC: 12.3 K/uL — ABNORMAL HIGH (ref 4.0–10.5)
nRBC: 0 % (ref 0.0–0.2)

## 2024-05-23 LAB — HEMOGLOBIN A1C
Hgb A1c MFr Bld: 5.8 % — ABNORMAL HIGH (ref 4.8–5.6)
Mean Plasma Glucose: 119.76 mg/dL

## 2024-05-23 SURGERY — FIXATION, FRACTURE, INTERTROCHANTERIC, WITH INTRAMEDULLARY ROD
Anesthesia: General | Laterality: Left

## 2024-05-23 MED ORDER — ORAL CARE MOUTH RINSE: 15.0000 mL | Freq: Once | OROMUCOSAL | Status: AC

## 2024-05-23 MED ORDER — HYDROMORPHONE HCL 1 MG/ML IJ SOLN
1.0000 mg | Freq: Four times a day (QID) | INTRAMUSCULAR | Status: DC | PRN
Start: 2024-05-23 — End: 2024-05-30
  Administered 2024-05-29: 1 mg via INTRAVENOUS
  Filled 2024-05-23: qty 1

## 2024-05-23 MED ORDER — PROPOFOL 500 MG/50ML IV EMUL
INTRAVENOUS | Status: DC | PRN
  Administered 2024-05-23: 75 ug/kg/min via INTRAVENOUS

## 2024-05-23 MED ORDER — DEXAMETHASONE SODIUM PHOSPHATE 10 MG/ML IJ SOLN
INTRAMUSCULAR | Status: DC | PRN
  Administered 2024-05-23: 5 mg via INTRAVENOUS

## 2024-05-23 MED ORDER — CHLORHEXIDINE GLUCONATE 0.12 % MT SOLN: 15.0000 mL | Freq: Once | OROMUCOSAL | Status: AC

## 2024-05-23 MED ORDER — LIDOCAINE 2% (20 MG/ML) 5 ML SYRINGE
INTRAMUSCULAR | Status: DC | PRN
  Administered 2024-05-23: 40 mg via INTRAVENOUS

## 2024-05-23 MED ORDER — ACETAMINOPHEN 10 MG/ML IV SOLN
INTRAVENOUS | Status: AC
Start: 2024-05-23 — End: 2024-05-23
  Filled 2024-05-23: qty 100

## 2024-05-23 MED ORDER — FENTANYL CITRATE (PF) 250 MCG/5ML IJ SOLN
INTRAMUSCULAR | Status: DC | PRN
  Administered 2024-05-23: 50 ug via INTRAVENOUS

## 2024-05-23 MED ORDER — LORAZEPAM 1 MG PO TABS
1.0000 mg | ORAL_TABLET | Freq: Once | ORAL | Status: AC
  Administered 2024-05-23: 1 mg via ORAL
  Filled 2024-05-23: qty 1

## 2024-05-23 MED ORDER — ACETAMINOPHEN 10 MG/ML IV SOLN
INTRAVENOUS | Status: DC | PRN
  Administered 2024-05-23: 1000 mg via INTRAVENOUS

## 2024-05-23 MED ORDER — ROCURONIUM BROMIDE 10 MG/ML (PF) SYRINGE
PREFILLED_SYRINGE | INTRAVENOUS | Status: DC | PRN
  Administered 2024-05-23: 50 mg via INTRAVENOUS
  Administered 2024-05-23: 20 mg via INTRAVENOUS

## 2024-05-23 MED ORDER — PROPOFOL 10 MG/ML IV BOLUS
INTRAVENOUS | Status: DC | PRN
  Administered 2024-05-23: 60 mg via INTRAVENOUS

## 2024-05-23 MED ORDER — 0.9 % SODIUM CHLORIDE (POUR BTL) OPTIME
TOPICAL | Status: DC | PRN
  Administered 2024-05-23: 1000 mL

## 2024-05-23 MED ORDER — SUGAMMADEX SODIUM 200 MG/2ML IV SOLN
INTRAVENOUS | Status: DC | PRN
  Administered 2024-05-23 (×2): 100 mg via INTRAVENOUS

## 2024-05-23 MED ORDER — VASOPRESSIN 20 UNIT/ML IV SOLN
INTRAVENOUS | Status: AC
  Filled 2024-05-23: qty 1

## 2024-05-23 MED ORDER — PHENYLEPHRINE HCL-NACL 20-0.9 MG/250ML-% IV SOLN
INTRAVENOUS | Status: DC | PRN
  Administered 2024-05-23: 75 ug/min via INTRAVENOUS

## 2024-05-23 MED ORDER — LACTATED RINGERS IV SOLN: INTRAVENOUS | Status: DC

## 2024-05-23 MED ORDER — CHLORHEXIDINE GLUCONATE 0.12 % MT SOLN
OROMUCOSAL | Status: AC
  Administered 2024-05-23: 15 mL via OROMUCOSAL
  Filled 2024-05-23: qty 15

## 2024-05-23 MED ORDER — PHENYLEPHRINE HCL-NACL 20-0.9 MG/250ML-% IV SOLN
INTRAVENOUS | Status: AC
  Filled 2024-05-23: qty 250

## 2024-05-23 MED ORDER — ONDANSETRON HCL 4 MG/2ML IJ SOLN
INTRAMUSCULAR | Status: DC | PRN
  Administered 2024-05-23: 4 mg via INTRAVENOUS

## 2024-05-23 MED ORDER — PROCHLORPERAZINE EDISYLATE 10 MG/2ML IJ SOLN
10.0000 mg | Freq: Once | INTRAMUSCULAR | Status: AC
  Administered 2024-05-23: 10 mg via INTRAVENOUS
  Filled 2024-05-23: qty 2

## 2024-05-23 MED ORDER — FENTANYL CITRATE PF 50 MCG/ML IJ SOSY
12.5000 ug | PREFILLED_SYRINGE | Freq: Once | INTRAMUSCULAR | Status: DC | PRN
Start: 2024-05-23 — End: 2024-05-30
  Filled 2024-05-23: qty 1

## 2024-05-23 MED ORDER — PROPOFOL 10 MG/ML IV BOLUS
INTRAVENOUS | Status: AC
  Filled 2024-05-23: qty 20

## 2024-05-23 MED ORDER — VASOPRESSIN 20 UNIT/ML IV SOLN
INTRAVENOUS | Status: DC | PRN
  Administered 2024-05-23: 1 [IU] via INTRAVENOUS
  Administered 2024-05-23: 2 [IU] via INTRAVENOUS

## 2024-05-23 MED ORDER — FENTANYL CITRATE (PF) 250 MCG/5ML IJ SOLN
INTRAMUSCULAR | Status: AC
  Filled 2024-05-23: qty 5

## 2024-05-23 MED ORDER — PHENYLEPHRINE 80 MCG/ML (10ML) SYRINGE FOR IV PUSH (FOR BLOOD PRESSURE SUPPORT)
PREFILLED_SYRINGE | INTRAVENOUS | Status: DC | PRN
Start: 2024-05-23 — End: 2024-05-23
  Administered 2024-05-23: 320 ug via INTRAVENOUS
  Administered 2024-05-23: 80 ug via INTRAVENOUS
  Administered 2024-05-23 (×2): 160 ug via INTRAVENOUS
  Administered 2024-05-23: 80 ug via INTRAVENOUS

## 2024-05-23 MED ORDER — PROPOFOL 1000 MG/100ML IV EMUL
INTRAVENOUS | Status: AC
  Filled 2024-05-23: qty 100

## 2024-05-23 SURGICAL SUPPLY — 43 items
BAG COUNTER SPONGE SURGICOUNT (BAG) ×1 IMPLANT
BIT DRILL CROWE POINT TWST 4.3 (DRILL) IMPLANT
BNDG COHESIVE 6X5 TAN ST LF (GAUZE/BANDAGES/DRESSINGS) IMPLANT
BRUSH SCRUB EZ PLAIN DRY (MISCELLANEOUS) ×2 IMPLANT
COVER PERINEAL POST (MISCELLANEOUS) ×1 IMPLANT
COVER SURGICAL LIGHT HANDLE (MISCELLANEOUS) ×2 IMPLANT
DRAPE C-ARM 42X72 X-RAY (DRAPES) ×1 IMPLANT
DRAPE C-ARMOR (DRAPES) ×1 IMPLANT
DRAPE HALF SHEET 40X57 (DRAPES) IMPLANT
DRAPE INCISE IOBAN 66X45 STRL (DRAPES) ×1 IMPLANT
DRAPE SURG ORHT 6 SPLT 77X108 (DRAPES) IMPLANT
DRAPE U-SHAPE 47X51 STRL (DRAPES) ×1 IMPLANT
DRESSING MEPILEX FLEX 4X4 (GAUZE/BANDAGES/DRESSINGS) ×1 IMPLANT
DRSG EMULSION OIL 3X3 NADH (GAUZE/BANDAGES/DRESSINGS) ×1 IMPLANT
DRSG MEPILEX POST OP 4X8 (GAUZE/BANDAGES/DRESSINGS) ×1 IMPLANT
ELECTRODE REM PT RTRN 9FT ADLT (ELECTROSURGICAL) ×1 IMPLANT
GLOVE BIO SURGEON STRL SZ7.5 (GLOVE) ×1 IMPLANT
GLOVE BIO SURGEON STRL SZ8 (GLOVE) ×1 IMPLANT
GLOVE BIOGEL PI IND STRL 7.5 (GLOVE) ×1 IMPLANT
GLOVE BIOGEL PI IND STRL 8 (GLOVE) ×1 IMPLANT
GLOVE SURG ORTHO LTX SZ7.5 (GLOVE) ×2 IMPLANT
GOWN STRL REUS W/ TWL LRG LVL3 (GOWN DISPOSABLE) ×2 IMPLANT
GOWN STRL REUS W/ TWL XL LVL3 (GOWN DISPOSABLE) ×1 IMPLANT
GUIDEPIN VERSANAIL DSP 3.2X444 (ORTHOPEDIC DISPOSABLE SUPPLIES) IMPLANT
GUIDEWIRE BALL NOSE 100CM (WIRE) IMPLANT
HFN LH 130 DEG 11MM X 380MM (Orthopedic Implant) IMPLANT
KIT BASIN OR (CUSTOM PROCEDURE TRAY) ×1 IMPLANT
KIT TURNOVER KIT B (KITS) ×1 IMPLANT
MANIFOLD NEPTUNE II (INSTRUMENTS) ×1 IMPLANT
NS IRRIG 1000ML POUR BTL (IV SOLUTION) ×1 IMPLANT
PACK GENERAL/GYN (CUSTOM PROCEDURE TRAY) ×1 IMPLANT
PAD ARMBOARD POSITIONER FOAM (MISCELLANEOUS) ×2 IMPLANT
SCREW BONE CORTICAL 5.0X40 (Screw) IMPLANT
SCREW LAG HIP NAIL 10.5X95 (Screw) IMPLANT
STAPLER SKIN PROX 35W (STAPLE) ×1 IMPLANT
STOCKINETTE IMPERVIOUS LG (DRAPES) IMPLANT
SUT ETHILON 2 0 PSLX (SUTURE) ×1 IMPLANT
SUT VIC AB 0 CT1 27XBRD ANBCTR (SUTURE) ×1 IMPLANT
SUT VIC AB 1 CT1 27XBRD ANBCTR (SUTURE) ×1 IMPLANT
SUT VIC AB 2-0 CT1 TAPERPNT 27 (SUTURE) ×1 IMPLANT
TOWEL GREEN STERILE (TOWEL DISPOSABLE) ×2 IMPLANT
TOWEL GREEN STERILE FF (TOWEL DISPOSABLE) ×1 IMPLANT
WATER STERILE IRR 1000ML POUR (IV SOLUTION) ×1 IMPLANT

## 2024-05-23 NOTE — Transfer of Care (Signed)
 Immediate Anesthesia Transfer of Care Note  Patient: Rachel Hines  Procedure(s) Performed: FIXATION, FRACTURE, INTERTROCHANTERIC, WITH INTRAMEDULLARY ROD (Left)  Patient Location: PACU  Anesthesia Type:General  Level of Consciousness: awake and alert   Airway & Oxygen Therapy: Patient Spontanous Breathing and Patient connected to nasal cannula oxygen  Post-op Assessment: Report given to RN and Post -op Vital signs reviewed and stable  Post vital signs: Reviewed and stable  Last Vitals:  Vitals Value Taken Time  BP 96/58 05/23/24 18:23  Temp 37.9 C 05/23/24 18:23  Pulse 99 05/23/24 18:27  Resp 21 05/23/24 18:27  SpO2 96 % 05/23/24 18:27  Vitals shown include unfiled device data.  Last Pain:  Vitals:   05/23/24 1823  TempSrc:   PainSc: Asleep      Patients Stated Pain Goal: 0 (05/23/24 0200)  Complications: No notable events documented.

## 2024-05-23 NOTE — Op Note (Signed)
 05/23/2024  PATIENT:  Rachel Hines  1931-06-11 female   MEDICAL RECORD NUMBER: [id number]  PRE-OPERATIVE DIAGNOSIS:  LEFT THREE PART INTERTROCHANTERIC HIP FRACTURE  POST-OPERATIVE DIAGNOSIS:  LEFT THREE PART INTERTROCHANTERIC HIP FRACTURE  PROCEDURE:  INTRAMEDULLARY NAILING OF THE LEFT HIP using a statically locked [employer] Affixus nail 11 x 380.  SURGEON:  Ozell DEL. Celena, M.D.  ASSISTANT:  None.  ANESTHESIA:  General.  COMPLICATIONS:  None.  ESTIMATED BLOOD LOSS:  Less than 150 mL.  DISPOSITION:  To PACU.  CONDITION:  Stable.  DELAY START OF DVT PROPHYLAXIS BECAUSE OF BLEEDING RISK: NO  BRIEF SUMMARY AND INDICATION OF PROCEDURE:  Rachel Hines is a 89 y.o. year- old with multiple medical problems and prior falls, hard of hearing, poor eyesight, and now left hip and proximal humerus fractures.  I discussed with the patient and family risks and benefits of surgical treatment for the hip including the potential for malunion, nonunion, symptomatic hardware, heart attack, stroke, neurovascular injury, bleeding, and others.  After acknowledging these risks, consent was provided consent to proceed.  BRIEF SUMMARY OF PROCEDURE:  The patient was taken to the operating room where general anesthesia was induced.  She was positioned supine on the Hana fracture table carefully protecting the proximal humerus.  A closed reduction maneuver was performed of the fractured proximal femur and this was confirmed on both AP and lateral xray views. A thorough scrub and wash with chlorhexidine  and then Betadine scrub and paint was performed.  After sterile drapes and time-out, a long instrument was used to identify the appropriate starting position under C-arm on both AP and lateral images.  A 3 cm incision was made proximal to the greater trochanter.  The curved cannulated awl was inserted just medial to the tip of the lateral trochanter and then the starting guidewire advanced into the proximal femur.   This was checked on AP and lateral views.  The starting reamer was engaged with the soft tissue protected by a sleeve.  The curved ball-tipped guidewire was then inserted, making sure it was just posterior as possible in the distal femur and across the fracture site, which stayed in a reduced position.  It was sequentially reamed up to 13 and an 11 x 380 mm nail inserted to the appropriate depth.  The guidewire for the lag screw was then inserted with the appropriate anteversion to make sure it was in a center-center position. An antirotation pin was also placed. This was measured and the lag screw placed with excellent purchase and position checked on both views.  The set screw was then engaged within the groove of the lag screw, which was allowed to telescope.  Traction was released and compression achieved with the compression device over the lag screw.  This was followed by placement of one distal locking screw using perfect circle technique.  This was confirmed on AP and lateral images. Wounds were irrigated thoroughly, closed in a standard layered fashion. Sterile gently compressive dressings were applied. The patient was awakened from anesthesia and transported to the PACU in stable condition.  PROGNOSIS:  The patient will be weightbearing as tolerated with physical therapy resuming Eliquis .  She has no range of motion precautions.  We will continue to follow through at the hospital.  Anticipate discharge to SNF given combination of needs and follow up in the office in 2 weeks for removal of sutures and further evaluation.     Ozell DEL. Celena, M.D.

## 2024-05-23 NOTE — TOC CAGE-AID Note (Signed)
 Transition of Care [facility]) - CAGE-AID Screening   Patient Details  Name: LENNA HAGARTY MRN: [id number] Date of Birth: [birth year]  [facility] CM/SW Contact:    Ishia Tenorio E Joneric Streight, LCSW Phone Number: 05/23/2024, 9:03 AM   Clinical Narrative: No SA noted.   CAGE-AID Screening:    Have You Ever Felt You Ought to Cut Down on Your Drinking or Drug Use?: No Have People Annoyed You By Critizing Your Drinking Or Drug Use?: No Have You Felt Bad Or Guilty About Your Drinking Or Drug Use?: No Have You Ever Had a Drink or Used Drugs First Thing In The Morning to Steady Your Nerves or to Get Rid of a Hangover?: No CAGE-AID Score: 0  Substance Abuse Education Offered: No

## 2024-05-23 NOTE — Anesthesia Postprocedure Evaluation (Signed)
 Anesthesia Post Note  Patient: Rachel Hines  Procedure(s) Performed: FIXATION, FRACTURE, INTERTROCHANTERIC, WITH INTRAMEDULLARY ROD (Left)     Patient location during evaluation: PACU Anesthesia Type: General Level of consciousness: sedated and patient cooperative Pain management: pain level controlled Vital Signs Assessment: post-procedure vital signs reviewed and stable Respiratory status: spontaneous breathing Cardiovascular status: stable Anesthetic complications: no   No notable events documented.  Last Vitals:  Vitals:   05/23/24 1915 05/23/24 1955  BP: (!) 102/46 (!) 101/47  Pulse: (!) 104 94  Resp: 19 18  Temp: 37 C 36.4 C  SpO2: 94% 98%    Last Pain:  Vitals:   05/23/24 1823  TempSrc:   PainSc: Asleep                 Norleen Pope

## 2024-05-23 NOTE — Anesthesia Preprocedure Evaluation (Signed)
 Anesthesia Evaluation  Patient identified by MRN, date of birth, ID band Patient awake    Reviewed: Allergy & Precautions, H&P , NPO status , Patient's Chart, lab work & pertinent test results  Airway Mallampati: III  TM Distance: >3 FB Neck ROM: Full    Dental no notable dental hx. (+) Edentulous Upper, Edentulous Lower, Dental Advisory Given   Pulmonary neg pulmonary ROS   Pulmonary exam normal breath sounds clear to auscultation       Cardiovascular hypertension, Pt. on medications and Pt. on home beta blockers + dysrhythmias Atrial Fibrillation  Rhythm:Irregular Rate:Tachycardia     Neuro/Psych CVA, Residual Symptoms  negative psych ROS   GI/Hepatic Neg liver ROS, hiatal hernia,GERD  Medicated,,  Endo/Other  diabetes    Renal/GU negative Renal ROS  negative genitourinary   Musculoskeletal  (+) Arthritis , Osteoarthritis,    Abdominal   Peds  Hematology negative hematology ROS (+)   Anesthesia Other Findings   Reproductive/Obstetrics negative OB ROS                              Anesthesia Physical Anesthesia Plan  ASA: 3  Anesthesia Plan: General   Post-op Pain Management: Ofirmev  IV (intra-op)*   Induction: Intravenous  PONV Risk Score and Plan: 4 or greater and Ondansetron , Dexamethasone  and Treatment may vary due to age or medical condition  Airway Management Planned: Oral ETT  Additional Equipment:   Intra-op Plan:   Post-operative Plan: Extubation in OR  Informed Consent: I have reviewed the patients History and Physical, chart, labs and discussed the procedure including the risks, benefits and alternatives for the proposed anesthesia with the patient or authorized representative who has indicated his/her understanding and acceptance.     Dental advisory given  Plan Discussed with: CRNA  Anesthesia Plan Comments:         Anesthesia Quick Evaluation

## 2024-05-23 NOTE — Consult Note (Signed)
 Orthopaedic Trauma Service (OTS) Consultation   Patient ID: Rachel Hines MRN: [id number] DOB/AGE: 1931-04-10 89 y.o.   Reason for Consult: left hip and left proximal humerus fractures Referring Physician: Claretta Alderman, MD  HPI: Rachel Hines is an 89 y.o. female who fell sustaining left hip and left shoulder pain. Host of medical problems and comorbidities, including A-fib, CVA, T2DM, HTN, HLD, arthritis, CKD 3A, prior fall, esophageal stricture requiring dilation, and long term anticoagulation. Lives alone, daughter lives across the street.  Past Medical History:  Diagnosis Date   Acute meniscal injury of left knee    Arthritis    Atrial fibrillation (HCC)    GERD (gastroesophageal reflux disease)    H/O hiatal hernia    Heart block AV first degree    History of esophageal dilatation    Hyperlipidemia    Hypertension    Impaired hearing    Left knee pain    severe   Type 2 diabetes mellitus (HCC)    Wears hearing aid     Past Surgical History:  Procedure Laterality Date   ABDOMINAL HYSTERECTOMY  1960'S   CATARACT EXTRACTION W/ INTRAOCULAR LENS IMPLANT Right    CHONDROPLASTY  04/14/2013   Procedure:  ABRATION CHONDROPLASTY OF MEDIAL CHONDIAL;  Surgeon: Tanda DELENA Heading, MD;  Location: [facility] [place];  Service: Orthopedics;;   KNEE ARTHROSCOPY WITH LATERAL MENISECTOMY Left 04/14/2013   Procedure: KNEE ARTHROSCOPY WITH LATERAL MENISECTOMY;  Surgeon: Tanda DELENA Heading, MD;  Location: [facility];  Service: Orthopedics;  Laterality: Left;   KNEE ARTHROSCOPY WITH MEDIAL MENISECTOMY Left 04/14/2013   Procedure: LEFT KNEE ARTHROSCOPY WITH MEDIAL MENISECTOMY;  Surgeon: Tanda DELENA Heading, MD;  Location: [facility];  Service: Orthopedics;  Laterality: Left;   TOTAL KNEE ARTHROPLASTY Right 2011    History reviewed. No pertinent family history.  Social History:  reports that she has never smoked. She has never used smokeless  tobacco. She reports that she does not drink alcohol and does not use drugs.  Allergies:  Allergies  Allergen Reactions   Atorvastatin Other (See Comments)    Severe muscle pain and bone pain   Other Rash and Other (See Comments)    Food dyes- Rashes and joint pain    Medications: Prior to Admission:  Medications Prior to Admission  Medication Sig Dispense Refill Last Dose/Taking   acetaminophen  (TYLENOL ) 500 MG tablet Take 1,000 mg by mouth in the morning and at bedtime.   05/21/2024 Bedtime   apixaban  (ELIQUIS ) 5 MG TABS tablet Take 1 tablet (5 mg total) by mouth 2 (two) times daily. (Patient taking differently: Take 5 mg by mouth daily at 6 PM.) 180 tablet 1 05/21/2024 at  6:00 PM   atenolol  (TENORMIN ) 50 MG tablet TAKE ONE TABLET BY MOUTH EVERY MORNING 90 tablet 0 05/22/2024 Morning   olmesartan  (BENICAR ) 40 MG tablet TAKE 1 TABLET BY MOUTH DAILY 30 tablet 0 05/22/2024 Morning   rosuvastatin  (CRESTOR ) 5 MG tablet TAKE 1 TABLET BY MOUTH DAILY (Patient taking differently: Take 5 mg by mouth in the morning.) 90 tablet 3 05/22/2024 Morning   zolpidem  (AMBIEN ) 10 MG tablet Take 5 mg by mouth at bedtime.   05/21/2024 Bedtime   oxyCODONE -acetaminophen  (PERCOCET/ROXICET) 5-325 MG tablet Take 1 tablet by mouth every 6 (six) hours as needed for severe pain (pain score 7-10). (Patient not taking: Reported on 05/22/2024) 8 tablet 0 Not Taking    Results for orders placed  or performed during the hospital encounter of 05/22/24 (from the past 48 hours)  CBC with Differential     Status: Abnormal   Collection Time: 05/22/24  4:48 PM  Result Value Ref Range   WBC 12.3 (H) 4.0 - 10.5 K/uL   RBC 3.21 (L) 3.87 - 5.11 MIL/uL   Hemoglobin 9.1 (L) 12.0 - 15.0 g/dL   HCT 70.2 (L) 63.9 - 53.9 %   MCV 92.5 80.0 - 100.0 fL   MCH 28.3 26.0 - 34.0 pg   MCHC 30.6 30.0 - 36.0 g/dL   RDW 87.1 88.4 - 84.4 %   Platelets 145 (L) 150 - 400 K/uL   nRBC 0.0 0.0 - 0.2 %   Neutrophils Relative % 86 %   Neutro Abs 10.6 (H) 1.7 -  7.7 K/uL   Lymphocytes Relative 8 %   Lymphs Abs 0.9 0.7 - 4.0 K/uL   Monocytes Relative 6 %   Monocytes Absolute 0.7 0.1 - 1.0 K/uL   Eosinophils Relative 0 %   Eosinophils Absolute 0.0 0.0 - 0.5 K/uL   Basophils Relative 0 %   Basophils Absolute 0.0 0.0 - 0.1 K/uL   Immature Granulocytes 0 %   Abs Immature Granulocytes 0.05 0.00 - 0.07 K/uL    Comment: Performed at [facility], [address] [place], [place] [address]  Comprehensive metabolic panel     Status: Abnormal   Collection Time: 05/22/24  4:48 PM  Result Value Ref Range   Sodium 139 135 - 145 mmol/L   Potassium 4.2 3.5 - 5.1 mmol/L   Chloride 106 98 - 111 mmol/L   CO2 22 22 - 32 mmol/L   Glucose, Bld 171 (H) 70 - 99 mg/dL    Comment: Glucose reference range applies only to samples taken after fasting for at least 8 hours.   BUN 23 8 - 23 mg/dL   Creatinine, Ser 9.08 0.44 - 1.00 mg/dL   Calcium  8.4 (L) 8.9 - 10.3 mg/dL   Total Protein 5.5 (L) 6.5 - 8.1 g/dL   Albumin 3.4 (L) 3.5 - 5.0 g/dL   AST 21 15 - 41 U/L   ALT 15 0 - 44 U/L   Alkaline Phosphatase 87 38 - 126 U/L   Total Bilirubin 0.5 0.0 - 1.2 mg/dL   GFR, Estimated 59 (L) >60 mL/min    Comment: (NOTE) Calculated using the CKD-EPI Creatinine Equation (2021)    Anion gap 11 5 - 15    Comment: Performed at [facility], [address], [place], [place] [address]  Type and screen [facility]     Status: None   Collection Time: 05/22/24  4:48 PM  Result Value Ref Range   ABO/RH(D) O POS    Antibody Screen NEG    Sample Expiration      05/25/2024,2359 Performed at [facility], [address], [place], [place] [address]   ABO/Rh     Status: None   Collection Time: 05/22/24  6:16 PM  Result Value Ref Range   ABO/RH(D)      O POS Performed at [facility], [address], [place], [place] [address]   Glucose, capillary     Status: Abnormal   Collection Time:  05/22/24  9:35 PM  Result Value Ref Range   Glucose-Capillary 166 (H) 70 - 99 mg/dL    Comment: Glucose reference range applies only to samples taken after fasting for at least 8 hours.  Glucose, capillary     Status: Abnormal   Collection Time: 05/23/24 12:07 AM  Result Value Ref Range   Glucose-Capillary 161 (H) 70 - 99 mg/dL    Comment: Glucose reference range applies only to samples taken after fasting for at least 8 hours.  Glucose, capillary     Status: Abnormal   Collection Time: 05/23/24  4:14 AM  Result Value Ref Range   Glucose-Capillary 148 (H) 70 - 99 mg/dL    Comment: Glucose reference range applies only to samples taken after fasting for at least 8 hours.  Basic metabolic panel     Status: Abnormal   Collection Time: 05/23/24  7:06 AM  Result Value Ref Range   Sodium 140 135 - 145 mmol/L   Potassium 5.1 3.5 - 5.1 mmol/L   Chloride 105 98 - 111 mmol/L   CO2 22 22 - 32 mmol/L   Glucose, Bld 156 (H) 70 - 99 mg/dL    Comment: Glucose reference range applies only to samples taken after fasting for at least 8 hours.   BUN 31 (H) 8 - 23 mg/dL   Creatinine, Ser 8.55 (H) 0.44 - 1.00 mg/dL   Calcium  8.2 (L) 8.9 - 10.3 mg/dL   GFR, Estimated 34 (L) >60 mL/min    Comment: (NOTE) Calculated using the CKD-EPI Creatinine Equation (2021)    Anion gap 13 5 - 15    Comment: Performed at [facility], [address], [place], [place] [address]  CBC     Status: Abnormal   Collection Time: 05/23/24  7:06 AM  Result Value Ref Range   WBC 12.3 (H) 4.0 - 10.5 K/uL   RBC 2.88 (L) 3.87 - 5.11 MIL/uL   Hemoglobin 8.3 (L) 12.0 - 15.0 g/dL   HCT 73.4 (L) 63.9 - 53.9 %   MCV 92.0 80.0 - 100.0 fL   MCH 28.8 26.0 - 34.0 pg   MCHC 31.3 30.0 - 36.0 g/dL   RDW 86.8 88.4 - 84.4 %   Platelets 176 150 - 400 K/uL   nRBC 0.0 0.0 - 0.2 %    Comment: Performed at [facility], [address], [place], [place] [address]  Hemoglobin A1c     Status: Abnormal   Collection Time: 05/23/24   7:06 AM  Result Value Ref Range   Hgb A1c MFr Bld 5.8 (H) 4.8 - 5.6 %    Comment: (NOTE) Diagnosis of Diabetes The following HbA1c ranges recommended by the American Diabetes Association (ADA) may be used as an aid in the diagnosis of diabetes mellitus.  Hemoglobin             Suggested A1C NGSP%              Diagnosis  <5.7                   Non Diabetic  5.7-6.4                Pre-Diabetic  >6.4                   Diabetic  <7.0                   Glycemic control for                       adults with diabetes.     Mean Plasma Glucose 119.76 mg/dL    Comment: Performed at [facility], [address], [place], [place] [address]  VITAMIN D  25 Hydroxy (Vit-D Deficiency, Fractures)     Status: Abnormal   Collection Time: 05/23/24  7:06 AM  Result Value Ref Range   Vit D, 25-Hydroxy 8.69 (L) 30 - 100 ng/mL    Comment: (NOTE) Vitamin D  deficiency has been defined by the Institute of Medicine  and an Endocrine Society practice guideline as a level of serum 25-OH  vitamin D  less than 20 ng/mL (1,2). The Endocrine Society went on to  further define vitamin D  insufficiency as a level between 21 and 29  ng/mL (2).  1. IOM (Institute of Medicine). 2010. Dietary reference intakes for  calcium  and D. [place]: [employer]. 2. Holick MF, Binkley Ravensworth, Bischoff-Ferrari HA, et al. Evaluation,  treatment, and prevention of vitamin D  deficiency: an Endocrine  Society clinical practice guideline, JCEM. 2011 Jul; 96(7): 1911-30.  Performed at [facility], [address] [place], [place] [address]   Glucose, capillary     Status: Abnormal   Collection Time: 05/23/24 11:39 AM  Result Value Ref Range   Glucose-Capillary 113 (H) 70 - 99 mg/dL    Comment: Glucose reference range applies only to samples taken after fasting for at least 8 hours.  Type and screen [facility]     Status: None (Preliminary result)   Collection Time: 05/23/24   1:10 PM  Result Value Ref Range   ABO/RH(D) O POS    Antibody Screen PENDING    Sample Expiration      05/26/2024,2359 Performed at [facility], [address] [place], [place] [address]     DG Knee Left Port Result Date: 05/22/2024 CLINICAL DATA:  Left hip fracture, fell EXAM: PORTABLE LEFT KNEE - 1-2 VIEW COMPARISON:  05/22/2024 FINDINGS: Frontal and cross-table lateral views of the left knee are obtained. There is severe 3 compartmental osteoarthritis greatest in the medial compartment. No acute fracture, subluxation, or dislocation. No joint effusion. Soft tissues are unremarkable. IMPRESSION: 1. Severe 3 compartmental osteoarthritis.  No acute fracture. Electronically Signed   By: Ozell Daring M.D.   On: 05/22/2024 21:47   CT Cervical Spine Wo Contrast Result Date: 05/22/2024 CLINICAL DATA:  Neck trauma (Age >= 65y).  Fall. EXAM: CT CERVICAL SPINE WITHOUT CONTRAST TECHNIQUE: Multidetector CT imaging of the cervical spine was performed without intravenous contrast. Multiplanar CT image reconstructions were also generated. RADIATION DOSE REDUCTION: This exam was performed according to the departmental dose-optimization program which includes automated exposure control, adjustment of the mA and/or kV according to patient size and/or use of iterative reconstruction technique. COMPARISON:  None Available. FINDINGS: Alignment: No subluxation Skull base and vertebrae: No acute fracture. No primary bone lesion or focal pathologic process. Soft tissues and spinal canal: No prevertebral fluid or swelling. No visible canal hematoma. Disc levels: Early disc space narrowing throughout the cervical spine. Mild to moderate bilateral degenerative facet disease. Upper chest: Trace left pleural effusion partially visualized. Other: None IMPRESSION: No acute bony abnormality. Electronically Signed   By: Franky Crease M.D.   On: 05/22/2024 19:16   CT Head Wo Contrast Result Date: 05/22/2024 CLINICAL DATA:   Head trauma, minor (Age >= 65y).  Fall. EXAM: CT HEAD WITHOUT CONTRAST TECHNIQUE: Contiguous axial images were obtained from the base of the skull through the vertex without intravenous contrast. RADIATION DOSE REDUCTION: This exam was performed according to the departmental dose-optimization program which includes automated exposure control,  adjustment of the mA and/or kV according to patient size and/or use of iterative reconstruction technique. COMPARISON:  04/25/2022 FINDINGS: Brain: Old left occipital infarct. There is atrophy and chronic small vessel disease changes. No acute intracranial abnormality. Specifically, no hemorrhage, hydrocephalus, mass lesion, acute infarction, or significant intracranial injury. Vascular: No hyperdense vessel or unexpected calcification. Skull: No acute calvarial abnormality. Sinuses/Orbits: No acute findings Other: None IMPRESSION: Atrophy, chronic microvascular disease. No acute intracranial abnormality. Old left occipital infarct. Electronically Signed   By: Franky Crease M.D.   On: 05/22/2024 19:14   DG Chest 1 View Result Date: 05/22/2024 CLINICAL DATA:  Status post fall. EXAM: CHEST  1 VIEW COMPARISON:  None Available. FINDINGS: The cardiac silhouette is enlarged. Both lungs are clear. There is an acute fracture deformity involving the head and neck of the proximal left humerus. An additional fracture of indeterminate age is seen involving the mid to distal left clavicle. A chronic fracture of the mid right clavicle is noted. IMPRESSION: 1. Acute fracture of the proximal left humerus. 2. Fracture of indeterminate age involving the mid to distal left clavicle. 3. Chronic fracture of the mid right clavicle. Electronically Signed   By: Suzen Dials M.D.   On: 05/22/2024 17:50   DG Hip Unilat W or Wo Pelvis 2-3 Views Left Result Date: 05/22/2024 CLINICAL DATA:  Status post fall. EXAM: DG HIP (WITH OR WITHOUT PELVIS) 2-3V LEFT COMPARISON:  None Available. FINDINGS: An  acute, comminuted fracture deformity is seen extending through the inter trochanteric region of the proximal left femur. Mild displacement of the lesser trochanter is seen. There is no evidence of dislocation. Soft tissue structures are unremarkable. IMPRESSION: Acute, comminuted fracture of the proximal left femur. Electronically Signed   By: Suzen Dials M.D.   On: 05/22/2024 17:48   DG Shoulder Left Result Date: 05/22/2024 CLINICAL DATA:  Status post fall. EXAM: LEFT SHOULDER - 2+ VIEW COMPARISON:  None Available. FINDINGS: And acute mildly impacted fracture deformity is seen involving the head and neck of the proximal left humerus. An ill-defined deformity of indeterminate age is also seen involving the mid to distal left clavicle. There is no evidence of dislocation. Degenerative changes seen involving the left glenohumeral articulation. Diffuse soft tissue swelling is present. IMPRESSION: 1. Acute mildly impacted fracture of the proximal left humerus. 2. Findings which may represent a fracture of the mid to distal left clavicle of indeterminate age. Correlation with physical examination is recommended to determine the presence of point tenderness. Electronically Signed   By: Suzen Dials M.D.   On: 05/22/2024 17:46    Intake/Output      09/07 0701 09/08 0700 09/08 0701 09/09 0700   P.O. 480 0   I.V. (mL/kg) 850.2 (12.5)    Total Intake(mL/kg) 1330.2 (19.6) 0 (0)   Urine (mL/kg/hr) 500    Total Output 500    Net +830.2 0           ROS No recent fever, bleeding abnormalities, urologic dysfunction, GI problems, or weight gain.  Blood pressure 102/60, pulse 73, temperature 98.3 F (36.8 C), temperature source Oral, resp. rate 18, height 5' 3 (1.6 m), weight 68 kg, SpO2 90%. Physical Exam LUE Sling in place  Sens  Ax/R/M/U intact  Mot   Ax/ R/ PIN/ M/ AIN/ U intact  Brisk CR  Tender shoulder, nontender elbow, hand, and wrist LLE Tender hip, shortened extremity  Edema/  swelling controlled  Sens: DPN, SPN, TN intact  Motor: EHL, FHL,  and lessor toe ext and flex all intact grossly  Brisk cap refill, warm to touch       Gait: could not assess Coordination and balance: could not assess   Assessment/Plan:  Left intertrochanteric hip fracture Left proximal humerus fracture A-fib, CVA, T2DM well controlled HTN, HLD, arthritis, CKD 3A, prior fall, esophageal stricture requiring dilation, and long term anticoagulation  The risks and benefits of left hip fracture repair were discussed with the patient, including the possibility of infection, nerve injury, vessel injury, wound breakdown, arthritis, symptomatic hardware, DVT/ PE, loss of motion, malunion, nonunion, and need for further surgery among others.  We also specifically discussed the left proximal humerus and surgical vs nonsurgical options.  These risks were acknowledged and consent was provided to proceed.  Weightbearing: WBAT LLE, NWB LUE, eventual transition to platform walker Insicional and dressing care: Reinforce dressings as needed Orthopedic device(s): platform walker Showering: yes, please VTE prophylaxis: Resume Eliquis   Pain control: Norco Follow - up plan: 2 weeks Contact information:  Ozell Bruch, MD, [place], PA-C   Ozell Bruch, MD [facility], [facility] [phone]  05/23/2024, 2:22 PM  [facility] [address] [place] [address] [phone] (F)    After 5pm and on the weekends please log on to Amion, go to orthopaedics and the look under the [facility] Call for the provider(s) on call. You can also call our office at [phone] and then follow the prompts to be connected to the call team.

## 2024-05-23 NOTE — Anesthesia Procedure Notes (Signed)
 Procedure Name: Intubation Date/Time: 05/23/2024 4:43 PM  Performed by: Javier Gell C., CRNAPre-anesthesia Checklist: Patient identified, Emergency Drugs available, Suction available, Patient being monitored and Timeout performed Patient Re-evaluated:Patient Re-evaluated prior to induction Oxygen Delivery Method: Circle system utilized Preoxygenation: Pre-oxygenation with 100% oxygen Induction Type: IV induction Ventilation: Mask ventilation without difficulty Laryngoscope Size: Mac and 3 Grade View: Grade I Tube type: Oral Tube size: 7.0 mm Number of attempts: 1 Airway Equipment and Method: Stylet Placement Confirmation: ETT inserted through vocal cords under direct vision, positive ETCO2 and breath sounds checked- equal and bilateral Secured at: 22 cm Tube secured with: Tape Dental Injury: Teeth and Oropharynx as per pre-operative assessment

## 2024-05-23 NOTE — Progress Notes (Signed)
 PROGRESS NOTE  Rachel Hines    DOB: [birth year], 89 y.o.  FMW:[id number]    Code Status: Full Code   DOA: 05/22/2024   LOS: 1   Brief hospital course  Rachel Hines is a 89 y.o. female with a PMH significant for A-fib on Eliquis , CVA, T2DM, HTN, HLD, arthritis and CKD 3A who presents to the ED for evaluation of left hip and shoulder pain after a fall.  ED Course: Initial vitals show patient afebrile and normotensive. Initial labs significant for glucose 171, creatinine 0.91, WBC 12.3, Hgb 9.1. EKG shows A-fib with slightly prolonged QTc of 490. CXR shows no acute cardiopulmonary disease.  X-ray of the left shoulder shows acute mildly impacted fracture of the proximal left humerus and old left clavicle fracture.  X-ray of the pelvis shows acute, comminuted fracture of the proximal left femur. CT head/ cervical spine negative acute. Pt received IV Zofran , IV morphine  and IV Dilaudid .  Orthopedic surgery was consulted for evaluation.  05/23/24 -humerus fracture treated conservatively. Ortho planning hip fixation 9/8  Assessment & Plan  Principal Problem:   Intertrochanteric fracture of left femur, closed, initial encounter [facility]) Active Problems:   Fall   Closed 2-part displaced fracture of surgical neck of left humerus   Type 2 diabetes mellitus with hyperglycemia, without long-term current use of insulin  (HCC)   Atrial fibrillation, chronic (HCC)  Mechanical fall # Left proximal hip fracture # Left proximal humerus fracture - Patient presented with left hip and humerus pain after tripping and and falling on her left side at home - X-ray of the left shoulder shows acute mildly impacted fracture of the proximal left humerus and X-ray of the pelvis shows acute, comminuted fracture of the proximal left femur. - Orthopedic surgery consulted, plan for IM nailing of the left hip and nonoperative treatment of the left humerus - Continue left shoulder immobilizer - Pain control with scheduled  Tylenol  and as needed IV Dilaudid  - Fall precautions - PT, OT   # T2DM with hyperglycemia- A1c 5.8 - Q4H SS with CBG monitoring while NPO   # A-fib  - EKG shows A-fib - HR stable in the 50s to 70s - Hold Eliquis  pending surgery, last Eliquis  dose 6 PM on 9/6 - Continue atenolol  - Telemetry   # HTN- BP stable. Decreases with opioid use. Will need close monitoring with pain control  - Continue atenolol  and hold olmesartan  (Irbesartan  as substitute)   # HLD - Continue rosuvastatin    # Insomnia - Continue Ambien  at bedtime  Body mass index is 26.57 kg/m.  VTE ppx: SCDs Start: 05/22/24 1943  Diet:     Diet   Diet NPO time specified Except for: Sips with Meds   Consultants: Ortho   Subjective 05/23/24    Pt reports significant nausea made worse by zofran . Pain is terrible in arm and leg.    Objective  Blood pressure (!) 104/52, pulse 92, temperature 98.4 F (36.9 C), temperature source Oral, resp. rate 17, height 5' 3 (1.6 m), weight 68 kg, SpO2 96%.  Intake/Output Summary (Last 24 hours) at 05/23/2024 0732 Last data filed at 05/23/2024 0700 Gross per 24 hour  Intake 1330.18 ml  Output 500 ml  Net 830.18 ml   Filed Weights   05/22/24 1629  Weight: 68 kg    Physical Exam:  General: awake, alert, NAD HEENT: atraumatic, clear conjunctiva, anicteric sclera, MMM, hard of hearing Respiratory: normal respiratory effort. Gastrointestinal: soft, NT, ND Nervous: A&O x3. no  gross focal neurologic deficits, normal speech Extremities: significant ecchymosis on left bicep. No external abnormality on left thigh Skin: dry, intact, normal temperature, normal color. No rashes, lesions or ulcers on exposed skin Psychiatry: normal mood, congruent affect  Labs   I have personally reviewed the following labs and imaging studies CBC    Component Value Date/Time   WBC 12.3 (H) 05/22/2024 1648   RBC 3.21 (L) 05/22/2024 1648   HGB 9.1 (L) 05/22/2024 1648   HGB 13.0 08/20/2022  0829   HCT 29.7 (L) 05/22/2024 1648   HCT 39.6 08/20/2022 0829   PLT 145 (L) 05/22/2024 1648   PLT 180 08/20/2022 0829   MCV 92.5 05/22/2024 1648   MCV 93 08/20/2022 0829   MCH 28.3 05/22/2024 1648   MCHC 30.6 05/22/2024 1648   RDW 12.8 05/22/2024 1648   RDW 12.0 08/20/2022 0829   LYMPHSABS 0.9 05/22/2024 1648   MONOABS 0.7 05/22/2024 1648   EOSABS 0.0 05/22/2024 1648   BASOSABS 0.0 05/22/2024 1648      Latest Ref Rng & Units 05/22/2024    4:48 PM 12/03/2022    9:29 AM 08/20/2022    8:29 AM  BMP  Glucose 70 - 99 mg/dL 828  99  96   BUN 8 - 23 mg/dL 23  23  22    Creatinine 0.44 - 1.00 mg/dL 9.08  8.88  8.97   BUN/Creat Ratio 12 - 28  21  22    Sodium 135 - 145 mmol/L 139  143  141   Potassium 3.5 - 5.1 mmol/L 4.2  4.7  5.0   Chloride 98 - 111 mmol/L 106  104  104   CO2 22 - 32 mmol/L 22  25  25    Calcium  8.9 - 10.3 mg/dL 8.4  9.4  9.1     DG Knee Left Port Result Date: 05/22/2024 CLINICAL DATA:  Left hip fracture, fell EXAM: PORTABLE LEFT KNEE - 1-2 VIEW COMPARISON:  05/22/2024 FINDINGS: Frontal and cross-table lateral views of the left knee are obtained. There is severe 3 compartmental osteoarthritis greatest in the medial compartment. No acute fracture, subluxation, or dislocation. No joint effusion. Soft tissues are unremarkable. IMPRESSION: 1. Severe 3 compartmental osteoarthritis.  No acute fracture. Electronically Signed   By: Ozell Daring M.D.   On: 05/22/2024 21:47   CT Cervical Spine Wo Contrast Result Date: 05/22/2024 CLINICAL DATA:  Neck trauma (Age >= 65y).  Fall. EXAM: CT CERVICAL SPINE WITHOUT CONTRAST TECHNIQUE: Multidetector CT imaging of the cervical spine was performed without intravenous contrast. Multiplanar CT image reconstructions were also generated. RADIATION DOSE REDUCTION: This exam was performed according to the departmental dose-optimization program which includes automated exposure control, adjustment of the mA and/or kV according to patient size and/or use  of iterative reconstruction technique. COMPARISON:  None Available. FINDINGS: Alignment: No subluxation Skull base and vertebrae: No acute fracture. No primary bone lesion or focal pathologic process. Soft tissues and spinal canal: No prevertebral fluid or swelling. No visible canal hematoma. Disc levels: Early disc space narrowing throughout the cervical spine. Mild to moderate bilateral degenerative facet disease. Upper chest: Trace left pleural effusion partially visualized. Other: None IMPRESSION: No acute bony abnormality. Electronically Signed   By: Franky Crease M.D.   On: 05/22/2024 19:16   CT Head Wo Contrast Result Date: 05/22/2024 CLINICAL DATA:  Head trauma, minor (Age >= 65y).  Fall. EXAM: CT HEAD WITHOUT CONTRAST TECHNIQUE: Contiguous axial images were obtained from the base of the skull through the  vertex without intravenous contrast. RADIATION DOSE REDUCTION: This exam was performed according to the departmental dose-optimization program which includes automated exposure control, adjustment of the mA and/or kV according to patient size and/or use of iterative reconstruction technique. COMPARISON:  04/25/2022 FINDINGS: Brain: Old left occipital infarct. There is atrophy and chronic small vessel disease changes. No acute intracranial abnormality. Specifically, no hemorrhage, hydrocephalus, mass lesion, acute infarction, or significant intracranial injury. Vascular: No hyperdense vessel or unexpected calcification. Skull: No acute calvarial abnormality. Sinuses/Orbits: No acute findings Other: None IMPRESSION: Atrophy, chronic microvascular disease. No acute intracranial abnormality. Old left occipital infarct. Electronically Signed   By: Franky Crease M.D.   On: 05/22/2024 19:14   DG Chest 1 View Result Date: 05/22/2024 CLINICAL DATA:  Status post fall. EXAM: CHEST  1 VIEW COMPARISON:  None Available. FINDINGS: The cardiac silhouette is enlarged. Both lungs are clear. There is an acute fracture  deformity involving the head and neck of the proximal left humerus. An additional fracture of indeterminate age is seen involving the mid to distal left clavicle. A chronic fracture of the mid right clavicle is noted. IMPRESSION: 1. Acute fracture of the proximal left humerus. 2. Fracture of indeterminate age involving the mid to distal left clavicle. 3. Chronic fracture of the mid right clavicle. Electronically Signed   By: Suzen Dials M.D.   On: 05/22/2024 17:50   DG Hip Unilat W or Wo Pelvis 2-3 Views Left Result Date: 05/22/2024 CLINICAL DATA:  Status post fall. EXAM: DG HIP (WITH OR WITHOUT PELVIS) 2-3V LEFT COMPARISON:  None Available. FINDINGS: An acute, comminuted fracture deformity is seen extending through the inter trochanteric region of the proximal left femur. Mild displacement of the lesser trochanter is seen. There is no evidence of dislocation. Soft tissue structures are unremarkable. IMPRESSION: Acute, comminuted fracture of the proximal left femur. Electronically Signed   By: Suzen Dials M.D.   On: 05/22/2024 17:48   DG Shoulder Left Result Date: 05/22/2024 CLINICAL DATA:  Status post fall. EXAM: LEFT SHOULDER - 2+ VIEW COMPARISON:  None Available. FINDINGS: And acute mildly impacted fracture deformity is seen involving the head and neck of the proximal left humerus. An ill-defined deformity of indeterminate age is also seen involving the mid to distal left clavicle. There is no evidence of dislocation. Degenerative changes seen involving the left glenohumeral articulation. Diffuse soft tissue swelling is present. IMPRESSION: 1. Acute mildly impacted fracture of the proximal left humerus. 2. Findings which may represent a fracture of the mid to distal left clavicle of indeterminate age. Correlation with physical examination is recommended to determine the presence of point tenderness. Electronically Signed   By: Suzen Dials M.D.   On: 05/22/2024 17:46    Disposition Plan &  Communication  Patient status: Inpatient  Admitted From: Home Planned disposition location: Skilled nursing facility Anticipated discharge date: 9/10 pending post-op recovery   Family Communication: daughters at bedside    Author: Marien LITTIE Piety, DO [facility] 05/23/2024, 7:32 AM   Available by Epic secure chat 7AM-7PM. If 7PM-7AM, please contact night-coverage.  [facility] contact information found on [email or web address].

## 2024-05-24 ENCOUNTER — Encounter (HOSPITAL_COMMUNITY): Payer: Self-pay | Admitting: Orthopedic Surgery

## 2024-05-24 ENCOUNTER — Inpatient Hospital Stay (HOSPITAL_COMMUNITY)

## 2024-05-24 DIAGNOSIS — S72142A Displaced intertrochanteric fracture of left femur, initial encounter for closed fracture: Secondary | ICD-10-CM | POA: Diagnosis not present

## 2024-05-24 LAB — BASIC METABOLIC PANEL WITH GFR
Anion gap: 10 (ref 5–15)
BUN: 47 mg/dL — ABNORMAL HIGH (ref 8–23)
CO2: 19 mmol/L — ABNORMAL LOW (ref 22–32)
Calcium: 7.9 mg/dL — ABNORMAL LOW (ref 8.9–10.3)
Chloride: 108 mmol/L (ref 98–111)
Creatinine, Ser: 1.94 mg/dL — ABNORMAL HIGH (ref 0.44–1.00)
GFR, Estimated: 24 mL/min — ABNORMAL LOW (ref >60)
Glucose, Bld: 192 mg/dL — ABNORMAL HIGH (ref 70–99)
Potassium: 4.7 mmol/L (ref 3.5–5.1)
Sodium: 137 mmol/L (ref 135–145)

## 2024-05-24 LAB — CBC
HCT: 20.1 % — ABNORMAL LOW (ref 36.0–46.0)
Hemoglobin: 6.5 g/dL — CL (ref 12.0–15.0)
MCH: 28.7 pg (ref 26.0–34.0)
MCHC: 31.8 g/dL (ref 30.0–36.0)
MCV: 90.1 fL (ref 80.0–100.0)
Platelets: 122 K/uL — ABNORMAL LOW (ref 150–400)
RBC: 2.23 MIL/uL — ABNORMAL LOW (ref 3.87–5.11)
RDW: 13.3 % (ref 11.5–15.5)
WBC: 12.7 K/uL — ABNORMAL HIGH (ref 4.0–10.5)
nRBC: 0 % (ref 0.0–0.2)

## 2024-05-24 LAB — TYPE AND SCREEN
ABO/RH(D): O POS
Antibody Screen: NEGATIVE
Unit division: 0

## 2024-05-24 LAB — GLUCOSE, CAPILLARY
Glucose-Capillary: 147 mg/dL — ABNORMAL HIGH (ref 70–99)
Glucose-Capillary: 156 mg/dL — ABNORMAL HIGH (ref 70–99)
Glucose-Capillary: 169 mg/dL — ABNORMAL HIGH (ref 70–99)
Glucose-Capillary: 177 mg/dL — ABNORMAL HIGH (ref 70–99)
Glucose-Capillary: 188 mg/dL — ABNORMAL HIGH (ref 70–99)

## 2024-05-24 LAB — BPAM RBC
Blood Product Expiration Date: 202509162359
Unit Type and Rh: 5100

## 2024-05-24 LAB — PREPARE RBC (CROSSMATCH)

## 2024-05-24 MED ORDER — OXYCODONE HCL 5 MG PO TABS
2.5000 mg | ORAL_TABLET | ORAL | Status: DC | PRN
  Administered 2024-05-24: 2.5 mg via ORAL
  Administered 2024-05-25 – 2024-05-30 (×10): 5 mg via ORAL
  Filled 2024-05-24 (×13): qty 1

## 2024-05-24 MED ORDER — ACETAMINOPHEN 325 MG PO TABS: 325.0000 mg | ORAL_TABLET | Freq: Four times a day (QID) | ORAL | Status: DC | PRN

## 2024-05-24 MED ORDER — MENTHOL 3 MG MT LOZG: 1.0000 | LOZENGE | OROMUCOSAL | Status: DC | PRN

## 2024-05-24 MED ORDER — ATENOLOL 25 MG PO TABS
12.5000 mg | ORAL_TABLET | Freq: Every morning | ORAL | Status: DC
Start: 2024-05-24 — End: 2024-05-25
  Administered 2024-05-24 – 2024-05-25 (×2): 12.5 mg via ORAL
  Filled 2024-05-24 (×2): qty 0.5

## 2024-05-24 MED ORDER — SODIUM CHLORIDE 0.9% IV SOLUTION: Freq: Once | INTRAVENOUS | Status: DC

## 2024-05-24 MED ORDER — DOCUSATE SODIUM 100 MG PO CAPS: 100.0000 mg | ORAL_CAPSULE | Freq: Two times a day (BID) | ORAL | Status: DC

## 2024-05-24 MED ORDER — VITAMIN D (ERGOCALCIFEROL) 1.25 MG (50000 UNIT) PO CAPS
50000.0000 [IU] | ORAL_CAPSULE | ORAL | Status: DC
  Administered 2024-05-24: 50000 [IU] via ORAL
  Filled 2024-05-24 (×2): qty 1

## 2024-05-24 MED ORDER — PHENOL 1.4 % MT LIQD: 1.0000 | OROMUCOSAL | Status: DC | PRN

## 2024-05-24 MED ORDER — POLYETHYLENE GLYCOL 3350 17 G PO PACK
17.0000 g | PACK | Freq: Two times a day (BID) | ORAL | Status: DC
  Administered 2024-05-24 – 2024-05-29 (×11): 17 g via ORAL
  Filled 2024-05-24 (×12): qty 1

## 2024-05-24 MED ORDER — ATENOLOL 25 MG PO TABS
12.5000 mg | ORAL_TABLET | Freq: Every morning | ORAL | Status: DC
Start: 2024-05-25 — End: 2024-05-24

## 2024-05-24 MED ORDER — CEFAZOLIN SODIUM-DEXTROSE 2-4 GM/100ML-% IV SOLN
2.0000 g | Freq: Two times a day (BID) | INTRAVENOUS | Status: AC
  Administered 2024-05-24 (×2): 2 g via INTRAVENOUS
  Filled 2024-05-24 (×2): qty 100

## 2024-05-24 MED ORDER — TRANEXAMIC ACID-NACL 1000-0.7 MG/100ML-% IV SOLN
1000.0000 mg | Freq: Once | INTRAVENOUS | Status: AC
  Administered 2024-05-24: 1000 mg via INTRAVENOUS
  Filled 2024-05-24: qty 100

## 2024-05-24 MED ORDER — INSULIN ASPART 100 UNIT/ML IJ SOLN
0.0000 [IU] | Freq: Three times a day (TID) | INTRAMUSCULAR | Status: DC
  Administered 2024-05-24 (×2): 2 [IU] via SUBCUTANEOUS
  Administered 2024-05-25 (×3): 1 [IU] via SUBCUTANEOUS
  Administered 2024-05-26: 2 [IU] via SUBCUTANEOUS
  Administered 2024-05-26: 3 [IU] via SUBCUTANEOUS
  Administered 2024-05-27 (×2): 1 [IU] via SUBCUTANEOUS
  Administered 2024-05-27: 2 [IU] via SUBCUTANEOUS

## 2024-05-24 MED ORDER — VITAMIN D 25 MCG (1000 UNIT) PO TABS
2000.0000 [IU] | ORAL_TABLET | Freq: Two times a day (BID) | ORAL | Status: DC
  Administered 2024-05-24 – 2024-05-29 (×12): 2000 [IU] via ORAL
  Filled 2024-05-24 (×12): qty 2

## 2024-05-24 MED ORDER — SODIUM CHLORIDE 0.9% IV SOLUTION
Freq: Once | INTRAVENOUS | Status: DC
Start: 2024-05-24 — End: 2024-05-30

## 2024-05-24 NOTE — Progress Notes (Signed)
 Orthopaedic Trauma Service Progress Note  Patient ID: Rachel Hines MRN: [id number] DOB/AGE: 1931/06/29 89 y.o.  Subjective:  Ortho issues stable Pain tolerable  CT L shoulder looks good, continue non-op treatment   Received 1 unit PRBCs this am and feels a little better   ROS As above  Today's  total administered Morphine  Milligram Equivalents: 3.75 Yesterday's total administered Morphine  Milligram Equivalents: 35  Objective:   VITALS:   Vitals:   05/24/24 0500 05/24/24 0804 05/24/24 1334 05/24/24 1349  BP: (!) 122/53 (!) 94/54 (!) 87/52 (!) 91/48  Pulse: 98 94 92 88  Resp: 18 16 16 15   Temp: 98 F (36.7 C) 98.1 F (36.7 C) 98.6 F (37 C) 98.2 F (36.8 C)  TempSrc: Oral Oral Oral Oral  SpO2: 98%  97% 97%  Weight:      Height:        Estimated body mass index is 26.57 kg/m as calculated from the following:   Height as of this encounter: 5' 3 (1.6 m).   Weight as of this encounter: 68 kg.   Intake/Output      09/08 0701 09/09 0700 09/09 0701 09/10 0700   P.O. 0 240   I.V. (mL/kg) 1200 (17.6)    IV Piggyback 100    Total Intake(mL/kg) 1300 (19.1) 240 (3.5)   Urine (mL/kg/hr)     Blood 75    Total Output 75    Net +1225 +240        Urine Occurrence  1 x   Stool Occurrence  1 x     LABS  Results for orders placed or performed during the hospital encounter of 05/22/24 (from the past 24 hours)  Glucose, capillary     Status: Abnormal   Collection Time: 05/23/24  6:28 PM  Result Value Ref Range   Glucose-Capillary 151 (H) 70 - 99 mg/dL   Comment 1 Notify RN    Comment 2 Document in Chart   Glucose, capillary     Status: Abnormal   Collection Time: 05/23/24  9:06 PM  Result Value Ref Range   Glucose-Capillary 164 (H) 70 - 99 mg/dL  Glucose, capillary     Status: Abnormal   Collection Time: 05/24/24 12:32 AM  Result Value Ref Range   Glucose-Capillary 169 (H) 70 - 99 mg/dL   CBC     Status: Abnormal   Collection Time: 05/24/24  4:34 AM  Result Value Ref Range   WBC 12.7 (H) 4.0 - 10.5 K/uL   RBC 2.23 (L) 3.87 - 5.11 MIL/uL   Hemoglobin 6.5 (LL) 12.0 - 15.0 g/dL   HCT 79.8 (L) 63.9 - 53.9 %   MCV 90.1 80.0 - 100.0 fL   MCH 28.7 26.0 - 34.0 pg   MCHC 31.8 30.0 - 36.0 g/dL   RDW 86.6 88.4 - 84.4 %   Platelets 122 (L) 150 - 400 K/uL   nRBC 0.0 0.0 - 0.2 %  Basic metabolic panel with GFR     Status: Abnormal   Collection Time: 05/24/24  4:34 AM  Result Value Ref Range   Sodium 137 135 - 145 mmol/L   Potassium 4.7 3.5 - 5.1 mmol/L   Chloride 108 98 - 111 mmol/L   CO2 19 (L) 22 - 32 mmol/L   Glucose, Bld 192 (H)  70 - 99 mg/dL   BUN 47 (H) 8 - 23 mg/dL   Creatinine, Ser 8.05 (H) 0.44 - 1.00 mg/dL   Calcium  7.9 (L) 8.9 - 10.3 mg/dL   GFR, Estimated 24 (L) >60 mL/min   Anion gap 10 5 - 15  Glucose, capillary     Status: Abnormal   Collection Time: 05/24/24  4:36 AM  Result Value Ref Range   Glucose-Capillary 188 (H) 70 - 99 mg/dL  Prepare RBC (crossmatch)     Status: None   Collection Time: 05/24/24  5:47 AM  Result Value Ref Range   Order Confirmation      ORDER PROCESSED BY BLOOD BANK Performed at [facility], [address] [place], [place] [address]   Type and screen [facility]     Status: None (Preliminary result)   Collection Time: 05/24/24 10:52 AM  Result Value Ref Range   ABO/RH(D) O POS    Antibody Screen NEG    Sample Expiration 05/27/2024,2359    Unit Number [id number]    Blood Component Type RED CELLS,LR    Unit division 00    Status of Unit ISSUED    Transfusion Status OK TO TRANSFUSE    Crossmatch Result      Compatible Performed at [facility], [address] [place], [place] [address]   Prepare RBC (crossmatch)     Status: None   Collection Time: 05/24/24 10:55 AM  Result Value Ref Range   Order Confirmation      ORDER PROCESSED BY BLOOD BANK Performed at [facility], [address], [place], [place] [address]   Glucose, capillary     Status: Abnormal   Collection Time: 05/24/24 11:17 AM  Result Value Ref Range   Glucose-Capillary 156 (H) 70 - 99 mg/dL     PHYSICAL EXAM:   Gen: resting comfortably in bed, looks good, NAD  Lungs: unlabored Ext:       Left upper extremity   Motor and sensory functions intact  No change in exam         Left Lower Extremity Dressing is clean, dry and intact  Extremity is warm  No DCT  Compartments are soft  No pain out of proportion with passive stretching of toes or ankle  DPN, SPN, TN sensory functions are intact  EHL, FHL, lesser toe motor functions intact  Ankle flexion, extension, inversion eversion intact  + DP pulse   Assessment/Plan: 1 Day Post-Op   Principal Problem:   Intertrochanteric fracture of left femur, closed, initial encounter [facility]) Active Problems:   Fall   Closed 2-part displaced fracture of surgical neck of left humerus   Type 2 diabetes mellitus with hyperglycemia, without long-term current use of insulin  (HCC)   Atrial fibrillation, chronic (HCC)   Anti-infectives (From admission, onward)    Start     Dose/Rate Route Frequency Ordered Stop   05/24/24 0830  ceFAZolin  (ANCEF ) IVPB 2g/100 mL premix        2 g 200 mL/hr over 30 Minutes Intravenous Every 12 hours 05/24/24 0801 05/25/24 0829   05/23/24 1200  ceFAZolin  (ANCEF ) IVPB 2g/100 mL premix  Status:  Discontinued        2 g 200 mL/hr over 30 Minutes Intravenous On call to O.R. 05/22/24 2016 05/24/24 0559     .  POD/HD#: 1  89 year old female status post fall with left intertrochanteric proximal femur fracture and left proximal humerus fracture  -  Fall  - Left intertrochanteric proximal femur fracture s/p IMN  WBAT with assistance  ROM as tolerated left hip and knee  Dressing changes as needed starting tomorrow  Ice as needed  Therapies  - Left proximal humerus fracture  Continue nonoperative management  Sling for  comfort   Pendulums and sling is okay  Gentle shoulder flexion and extension  Unrestricted range of motion of elbow, forearm wrist and hand  Ice as needed  - Pain management:  Multimodal, minimize narcotics  - ABL anemia/Hemodynamics  Received 1 unit of PRBCs this morning  CBC in the morning - Medical issues   Per primary - DVT/PE prophylaxis:  Eliquis  on hold  - ID:   Perioperative antibiotics - Metabolic Bone Disease:  Fracture is a fragility fracture and indicative of osteoporosis  Labs show vitamin D  deficiency   Supplement  - Activity:  As above  - Impediments to fracture healing:  Vitamin D  deficiency  Fragility fracture  Diabetes  - Dispo:  Ortho issues are stable  Therapy evaluations  TOC consult    Francis MICAEL Mt, PA-C [phone] (C) 05/24/2024, 2:54 PM  [facility] [address] [place] [address] [phone] (F)    After 5pm and on the weekends please log on to Amion, go to orthopaedics and the look under the [facility] Call for the provider(s) on call. You can also call our office at [phone] and then follow the prompts to be connected to the call team.  Patient ID: Rachel Hines, female   DOB: April 29, 1931, 89 y.o.   MRN: [id number]

## 2024-05-24 NOTE — Evaluation (Signed)
 Clinical/Bedside Swallow Evaluation Patient Details  Name: Rachel Hines MRN: [id number] Date of Birth: October 02, 1930  Today's Date: 05/24/2024 Time: SLP Start Time (ACUTE ONLY): 1500 SLP Stop Time (ACUTE ONLY): 1523 SLP Time Calculation (min) (ACUTE ONLY): 23 min  Past Medical History:  Past Medical History:  Diagnosis Date   Acute meniscal injury of left knee    Arthritis    Atrial fibrillation (HCC)    GERD (gastroesophageal reflux disease)    H/O hiatal hernia    Heart block AV first degree    History of esophageal dilatation    Hyperlipidemia    Hypertension    Impaired hearing    Left knee pain    severe   Type 2 diabetes mellitus (HCC)    Wears hearing aid    Past Surgical History:  Past Surgical History:  Procedure Laterality Date   ABDOMINAL HYSTERECTOMY  1960'S   CATARACT EXTRACTION W/ INTRAOCULAR LENS IMPLANT Right    CHONDROPLASTY  04/14/2013   Procedure:  ABRATION CHONDROPLASTY OF MEDIAL CHONDIAL;  Surgeon: Tanda DELENA Heading, MD;  Location: [facility];  Service: Orthopedics;;   INTRAMEDULLARY (IM) NAIL INTERTROCHANTERIC Left 05/23/2024   Procedure: FIXATION, FRACTURE, INTERTROCHANTERIC, WITH INTRAMEDULLARY ROD;  Surgeon: Celena Sharper, MD;  Location: [facility] OR;  Service: Orthopedics;  Laterality: Left;   KNEE ARTHROSCOPY WITH LATERAL MENISECTOMY Left 04/14/2013   Procedure: KNEE ARTHROSCOPY WITH LATERAL MENISECTOMY;  Surgeon: Tanda DELENA Heading, MD;  Location: [facility];  Service: Orthopedics;  Laterality: Left;   KNEE ARTHROSCOPY WITH MEDIAL MENISECTOMY Left 04/14/2013   Procedure: LEFT KNEE ARTHROSCOPY WITH MEDIAL MENISECTOMY;  Surgeon: Tanda DELENA Heading, MD;  Location: [facility] [place];  Service: Orthopedics;  Laterality: Left;   TOTAL KNEE ARTHROPLASTY Right 2011   HPI:  [name] is a 89 y.o. female who presents to the ED for evaluation of left hip and shoulder pain after a fall.    CXR shows no acute cardiopulmonary disease.   X-ray of the left shoulder shows acute mildly impacted fracture of the proximal left humerus and old left clavicle fracture.  X-ray of the pelvis shows acute, comminuted fracture of the proximal left femur. CT head/ cervical spine negative acute. 05/24/24 -humerus fracture treated conservatively. Ortho performed hip fixation 9/8. Significant hematoma of left humerus fracture. Hx of esophageal dysphagia with EGD in 2000, no further tx    Assessment / Plan / Recommendation  Clinical Impression  Pt presents with no acute changes to swallowing or significant risk of aspiration from baseline since her hip fx. She does have a history of esophageal dysphagia and reports her oral intake has been limited due to globus. Given dx of stricture in 2000 and ongoing symptoms in advanced age, pt very likely to suffer from some form of esophageal dysmotility. SLP reviewed typical strategies, precautions and pts current habits. Encouraged her to increase her daily intake of protein with liquid supplements. Family at bedside in agreement. No f/u needed, SLP will sign off. SLP Visit Diagnosis: Dysphagia, unspecified (R13.10)    Aspiration Risk  Mild aspiration risk    Diet Recommendation Regular;Thin liquid    Liquid Administration via: Cup;Straw Medication Administration: Whole meds with liquid Supervision: Patient able to self feed Compensations: Slow rate;Small sips/bites;Follow solids with liquid Postural Changes: Seated upright at 90 degrees;Remain upright for at least 30 minutes after po intake    Other  Recommendations Oral Care Recommendations: Patient independent with oral care     Assistance Recommended at  Discharge    Functional Status Assessment    Frequency and Duration            Prognosis        Swallow Study   General HPI: [name] is a 89 y.o. female who presents to the ED for evaluation of left hip and shoulder pain after a fall.    CXR shows no acute cardiopulmonary disease.   X-ray of the left shoulder shows acute mildly impacted fracture of the proximal left humerus and old left clavicle fracture.  X-ray of the pelvis shows acute, comminuted fracture of the proximal left femur. CT head/ cervical spine negative acute. 05/24/24 -humerus fracture treated conservatively. Ortho performed hip fixation 9/8. Significant hematoma of left humerus fracture. Hx of esophageal dysphagia with EGD in 2000, no further tx Type of Study: Bedside Swallow Evaluation Previous Swallow Assessment: none History of Recent Intubation: No Behavior/Cognition: Alert;Cooperative;Pleasant mood Oral Cavity Assessment: Within Functional Limits Oral Care Completed by SLP: No Oral Cavity - Dentition: Dentures, top;Dentures, bottom Vision: Functional for self-feeding Self-Feeding Abilities: Able to feed self Patient Positioning: Upright in bed Baseline Vocal Quality: Normal Volitional Cough: Strong Volitional Swallow: Able to elicit    Oral/Motor/Sensory Function Overall Oral Motor/Sensory Function: Within functional limits   Ice Chips     Thin Liquid Thin Liquid: Within functional limits Presentation: Self Fed;Straw    Nectar Thick Nectar Thick Liquid: Not tested   Honey Thick Honey Thick Liquid: Not tested   Puree Puree: Within functional limits   Solid     Solid: Within functional limits      Man Bonneau, Consuelo Fitch 05/24/2024,3:27 PM

## 2024-05-24 NOTE — Progress Notes (Signed)
 Lab called at 0545 to report hgb of 6.5  J. Blondie NP was notified and ordered 1 unit of PRBC

## 2024-05-24 NOTE — Plan of Care (Signed)

## 2024-05-24 NOTE — Progress Notes (Signed)
 PROGRESS NOTE  Rachel Hines    DOB: [birth year], 89 y.o.  FMW:[id number]    Code Status: Full Code   DOA: 05/22/2024   LOS: 2  Brief hospital course  Rachel Hines is a 89 y.o. female with a PMH significant for A-fib on Eliquis , CVA, T2DM, HTN, HLD, arthritis and CKD 3A who presents to the ED for evaluation of left hip and shoulder pain after a fall.  ED Course: Initial vitals show patient afebrile and normotensive. Initial labs significant for glucose 171, creatinine 0.91, WBC 12.3, Hgb 9.1. EKG shows A-fib with slightly prolonged QTc of 490. CXR shows no acute cardiopulmonary disease.  X-ray of the left shoulder shows acute mildly impacted fracture of the proximal left humerus and old left clavicle fracture.  X-ray of the pelvis shows acute, comminuted fracture of the proximal left femur. CT head/ cervical spine negative acute. Pt received IV Zofran , IV morphine  and IV Dilaudid .  Orthopedic surgery was consulted for evaluation.   05/24/24 -humerus fracture treated conservatively. Ortho performed hip fixation 9/8.  Requiring blood transfusion today. Significant hematoma of left humerus fracture  Assessment & Plan  Principal Problem:   Intertrochanteric fracture of left femur, closed, initial encounter [facility]) Active Problems:   Fall   Closed 2-part displaced fracture of surgical neck of left humerus   Type 2 diabetes mellitus with hyperglycemia, without long-term current use of insulin  (HCC)   Atrial fibrillation, chronic (HCC)  Mechanical fall # Left proximal hip fracture # Left proximal humerus fracture - Patient presented with left hip and humerus pain after tripping and and falling on her left side at home - X-ray of the left shoulder shows acute mildly impacted fracture of the proximal left humerus and X-ray of the pelvis shows acute, comminuted fracture of the proximal left femur. - Orthopedic surgery consulted, plan for IM nailing of the left hip and nonoperative treatment of the  left humerus - Continue left shoulder immobilizer - Pain control with scheduled Tylenol  and as needed IV Dilaudid  - Fall precautions - PT, OT  Acute blood loss anemia Hematoma of left bicep- see clinical image for further detail - hgb dropped to 6.8 today post-op. Eliquis  is still being held.  - type and screen repeated and 1u pRBC ordered.  - post CBC. Goal hgb >7.  - CBC am    # T2DM with hyperglycemia- A1c 5.8 - sSSI   # A-fib- mildly elevated HR. Decreased home atenolol  due to hypotension but will hopefully be able to increase dose back up to home after transfusion.  - restart eliquis  when hgb stabilizes  - Telemetry   # HTN- BP stable. Decreases with opioid use. Will need close monitoring with pain control  - decreased atenolol  and hold olmesartan  (Irbesartan  as substitute)   # HLD - Continue rosuvastatin    # Insomnia - Continue Ambien  at bedtime  Body mass index is 26.57 kg/m.  VTE ppx: SCDs Start: 05/22/24 1943  Diet:     Diet   Diet NPO time specified Except for: Sips with Meds   Consultants: Ortho   Subjective 05/24/24    Pt reports having significant pain in hip and left arm.    Objective  Blood pressure (!) 104/52, pulse 92, temperature 98.4 F (36.9 C), temperature source Oral, resp. rate 17, height 5' 3 (1.6 m), weight 68 kg, SpO2 96%.  Intake/Output Summary (Last 24 hours) at 05/24/2024 0729 Last data filed at 05/23/2024 1803 Gross per 24 hour  Intake 1300 ml  Output 75 ml  Net 1225 ml   Filed Weights   05/22/24 1629 05/23/24 1254  Weight: 68 kg 68 kg    Physical Exam:  General: awake, alert, NAD. lethargic HEENT: atraumatic, clear conjunctiva, anicteric sclera, MMM, hard of hearing Respiratory: normal respiratory effort. Nervous: A&O x3. no gross focal neurologic deficits, normal speech Extremities: significant ecchymosis/hematoma on left bicep. See clinical image for further detail. Bruising at surgical incisions on left hip.   Psychiatry: normal mood, congruent affect  Labs   I have personally reviewed the following labs and imaging studies CBC    Component Value Date/Time   WBC 12.7 (H) 05/24/2024 0434   RBC 2.23 (L) 05/24/2024 0434   HGB 6.5 (LL) 05/24/2024 0434   HGB 13.0 08/20/2022 0829   HCT 20.1 (L) 05/24/2024 0434   HCT 39.6 08/20/2022 0829   PLT 122 (L) 05/24/2024 0434   PLT 180 08/20/2022 0829   MCV 90.1 05/24/2024 0434   MCV 93 08/20/2022 0829   MCH 28.7 05/24/2024 0434   MCHC 31.8 05/24/2024 0434   RDW 13.3 05/24/2024 0434   RDW 12.0 08/20/2022 0829   LYMPHSABS 0.9 05/22/2024 1648   MONOABS 0.7 05/22/2024 1648   EOSABS 0.0 05/22/2024 1648   BASOSABS 0.0 05/22/2024 1648      Latest Ref Rng & Units 05/24/2024    4:34 AM 05/23/2024    7:06 AM 05/22/2024    4:48 PM  BMP  Glucose 70 - 99 mg/dL 807  843  828   BUN 8 - 23 mg/dL 47  31  23   Creatinine 0.44 - 1.00 mg/dL 8.05  8.55  9.08   Sodium 135 - 145 mmol/L 137  140  139   Potassium 3.5 - 5.1 mmol/L 4.7  5.1  4.2   Chloride 98 - 111 mmol/L 108  105  106   CO2 22 - 32 mmol/L 19  22  22    Calcium  8.9 - 10.3 mg/dL 7.9  8.2  8.4     CT SHOULDER LEFT WO CONTRAST Result Date: 05/23/2024 CLINICAL DATA:  Follow-up shoulder surgery EXAM: CT OF THE UPPER LEFT EXTREMITY WITHOUT CONTRAST TECHNIQUE: Multidetector CT imaging of the upper left extremity was performed according to the standard protocol. RADIATION DOSE REDUCTION: This exam was performed according to the departmental dose-optimization program which includes automated exposure control, adjustment of the mA and/or kV according to patient size and/or use of iterative reconstruction technique. COMPARISON:  05/22/2024 FINDINGS: Bones/Joint/Cartilage Impacted left humeral neck fracture noted. Mild posterior displacement of the humeral head relative to the neck, best seen on sagittal imaging. No subluxation or dislocation. 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 Ligaments Suboptimally  assessed by CT. Muscles and Tendons Unremarkable Soft tissues Soft tissue swelling/edema within the subcutaneous soft tissues. IMPRESSION: Impacted left humeral neck fracture with mild posterior displacement of the humeral head relative to the neck and shaft. 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 Electronically Signed   By: Franky Crease M.D.   On: 05/23/2024 23:11   DG FEMUR MIN 2 VIEWS LEFT Result Date: 05/23/2024 CLINICAL DATA:  Internal fixation EXAM: LEFT FEMUR 2 VIEWS COMPARISON:  05/22/2024 FINDINGS: Multiple intraoperative spot images demonstrate internal fixation across the left femoral intertrochanteric fracture. Anatomic alignment. No hardware complicating feature. IMPRESSION: Internal fixation without visible complicating feature. Electronically Signed   By: Franky Crease M.D.   On: 05/23/2024 19:30   DG C-Arm 1-60 Min-No Report Result Date: 05/23/2024 Fluoroscopy was utilized by the requesting  physician.  No radiographic interpretation.   DG C-Arm 1-60 Min-No Report Result Date: 05/23/2024 Fluoroscopy was utilized by the requesting physician.  No radiographic interpretation.   DG Knee Left Port Result Date: 05/22/2024 CLINICAL DATA:  Left hip fracture, fell EXAM: PORTABLE LEFT KNEE - 1-2 VIEW COMPARISON:  05/22/2024 FINDINGS: Frontal and cross-table lateral views of the left knee are obtained. There is severe 3 compartmental osteoarthritis greatest in the medial compartment. No acute fracture, subluxation, or dislocation. No joint effusion. Soft tissues are unremarkable. IMPRESSION: 1. Severe 3 compartmental osteoarthritis.  No acute fracture. Electronically Signed   By: Ozell Daring M.D.   On: 05/22/2024 21:47   CT Cervical Spine Wo Contrast Result Date: 05/22/2024 CLINICAL DATA:  Neck trauma (Age >= 65y).  Fall. EXAM: CT CERVICAL SPINE WITHOUT CONTRAST TECHNIQUE: Multidetector CT imaging of the cervical spine was performed without intravenous contrast. Multiplanar CT image  reconstructions were also generated. RADIATION DOSE REDUCTION: This exam was performed according to the departmental dose-optimization program which includes automated exposure control, adjustment of the mA and/or kV according to patient size and/or use of iterative reconstruction technique. COMPARISON:  None Available. FINDINGS: Alignment: No subluxation Skull base and vertebrae: No acute fracture. No primary bone lesion or focal pathologic process. Soft tissues and spinal canal: No prevertebral fluid or swelling. No visible canal hematoma. Disc levels: Early disc space narrowing throughout the cervical spine. Mild to moderate bilateral degenerative facet disease. Upper chest: Trace left pleural effusion partially visualized. Other: None IMPRESSION: No acute bony abnormality. Electronically Signed   By: Franky Crease M.D.   On: 05/22/2024 19:16   CT Head Wo Contrast Result Date: 05/22/2024 CLINICAL DATA:  Head trauma, minor (Age >= 65y).  Fall. EXAM: CT HEAD WITHOUT CONTRAST TECHNIQUE: Contiguous axial images were obtained from the base of the skull through the vertex without intravenous contrast. RADIATION DOSE REDUCTION: This exam was performed according to the departmental dose-optimization program which includes automated exposure control, adjustment of the mA and/or kV according to patient size and/or use of iterative reconstruction technique. COMPARISON:  04/25/2022 FINDINGS: Brain: Old left occipital infarct. There is atrophy and chronic small vessel disease changes. No acute intracranial abnormality. Specifically, no hemorrhage, hydrocephalus, mass lesion, acute infarction, or significant intracranial injury. Vascular: No hyperdense vessel or unexpected calcification. Skull: No acute calvarial abnormality. Sinuses/Orbits: No acute findings Other: None IMPRESSION: Atrophy, chronic microvascular disease. No acute intracranial abnormality. Old left occipital infarct. Electronically Signed   By: Franky Crease  M.D.   On: 05/22/2024 19:14   DG Chest 1 View Result Date: 05/22/2024 CLINICAL DATA:  Status post fall. EXAM: CHEST  1 VIEW COMPARISON:  None Available. FINDINGS: The cardiac silhouette is enlarged. Both lungs are clear. There is an acute fracture deformity involving the head and neck of the proximal left humerus. An additional fracture of indeterminate age is seen involving the mid to distal left clavicle. A chronic fracture of the mid right clavicle is noted. IMPRESSION: 1. Acute fracture of the proximal left humerus. 2. Fracture of indeterminate age involving the mid to distal left clavicle. 3. Chronic fracture of the mid right clavicle. Electronically Signed   By: Suzen Dials M.D.   On: 05/22/2024 17:50   DG Hip Unilat W or Wo Pelvis 2-3 Views Left Result Date: 05/22/2024 CLINICAL DATA:  Status post fall. EXAM: DG HIP (WITH OR WITHOUT PELVIS) 2-3V LEFT COMPARISON:  None Available. FINDINGS: An acute, comminuted fracture deformity is seen extending through the inter trochanteric region of  the proximal left femur. Mild displacement of the lesser trochanter is seen. There is no evidence of dislocation. Soft tissue structures are unremarkable. IMPRESSION: Acute, comminuted fracture of the proximal left femur. Electronically Signed   By: Suzen Dials M.D.   On: 05/22/2024 17:48   DG Shoulder Left Result Date: 05/22/2024 CLINICAL DATA:  Status post fall. EXAM: LEFT SHOULDER - 2+ VIEW COMPARISON:  None Available. FINDINGS: And acute mildly impacted fracture deformity is seen involving the head and neck of the proximal left humerus. An ill-defined deformity of indeterminate age is also seen involving the mid to distal left clavicle. There is no evidence of dislocation. Degenerative changes seen involving the left glenohumeral articulation. Diffuse soft tissue swelling is present. IMPRESSION: 1. Acute mildly impacted fracture of the proximal left humerus. 2. Findings which may represent a fracture of the  mid to distal left clavicle of indeterminate age. Correlation with physical examination is recommended to determine the presence of point tenderness. Electronically Signed   By: Suzen Dials M.D.   On: 05/22/2024 17:46    Disposition Plan & Communication  Patient status: Inpatient  Admitted From: Home Planned disposition location: Skilled nursing facility Anticipated discharge date: 9/10 pending post-op recovery   Family Communication: daughter at bedside    Author: Marien LITTIE Piety, DO [facility] 05/24/2024, 7:29 AM   Available by Epic secure chat 7AM-7PM. If 7PM-7AM, please contact night-coverage.  [facility] contact information found on [email or web address].

## 2024-05-25 DIAGNOSIS — S72142A Displaced intertrochanteric fracture of left femur, initial encounter for closed fracture: Secondary | ICD-10-CM | POA: Diagnosis not present

## 2024-05-25 DIAGNOSIS — E1165 Type 2 diabetes mellitus with hyperglycemia: Secondary | ICD-10-CM | POA: Diagnosis not present

## 2024-05-25 DIAGNOSIS — I482 Chronic atrial fibrillation, unspecified: Secondary | ICD-10-CM | POA: Diagnosis not present

## 2024-05-25 LAB — GLUCOSE, CAPILLARY
Glucose-Capillary: 128 mg/dL — ABNORMAL HIGH (ref 70–99)
Glucose-Capillary: 135 mg/dL — ABNORMAL HIGH (ref 70–99)
Glucose-Capillary: 135 mg/dL — ABNORMAL HIGH (ref 70–99)
Glucose-Capillary: 113 mg/dL — ABNORMAL HIGH (ref 70–99)

## 2024-05-25 LAB — CBC
HCT: 22.4 % — ABNORMAL LOW (ref 36.0–46.0)
Hemoglobin: 7.4 g/dL — ABNORMAL LOW (ref 12.0–15.0)
MCH: 29.4 pg (ref 26.0–34.0)
MCHC: 33 g/dL (ref 30.0–36.0)
MCV: 88.9 fL (ref 80.0–100.0)
Platelets: 132 K/uL — ABNORMAL LOW (ref 150–400)
RBC: 2.52 MIL/uL — ABNORMAL LOW (ref 3.87–5.11)
RDW: 13.7 % (ref 11.5–15.5)
WBC: 14.1 K/uL — ABNORMAL HIGH (ref 4.0–10.5)
nRBC: 0.3 % — ABNORMAL HIGH (ref 0.0–0.2)

## 2024-05-25 MED ORDER — GLUCERNA SHAKE PO LIQD
237.0000 mL | Freq: Three times a day (TID) | ORAL | Status: DC
  Administered 2024-05-25 – 2024-05-29 (×9): 237 mL via ORAL

## 2024-05-25 MED ORDER — LACTATED RINGERS IV SOLN: INTRAVENOUS | Status: AC

## 2024-05-25 NOTE — Plan of Care (Signed)

## 2024-05-25 NOTE — Care Management Important Message (Signed)
 Important Message  Patient Details  Name: MAKENZYE TROUTMAN MRN: [id number] Date of Birth: 1931/01/09   Important Message Given:  Yes - Medicare IM     Jon Cruel 05/25/2024, 3:59 PM

## 2024-05-25 NOTE — TOC Initial Note (Addendum)
 Transition of Care [facility]) - Initial/Assessment Note    Patient Details  Name: Rachel Hines MRN: [id number] Date of Birth: 13-Mar-1931  [facility]) CM/SW Contact:    Bridget Cordella Simmonds, LCSW Phone Number: 05/25/2024, 2:34 PM  Clinical Narrative:  CSW met with pt, daughter Blackston, daughter Verneita, son Arley regarding PT recommendation for SNF.  They are agreeable to SNF, would be interested in [facility] or [facility].  Medicare choice document provided.    Pt lives alone in a town home, daughter Quesnel reports they have been talking with her about assisted living but pt has been resistant to go, asking for any assistance.  CSW spoke with all of them and with [name] outside the room.  They have looked at options, she needs help to get pt to agree to move forward.  Referral sent out to SNF, CSW reached out to [facility] to review.     [facility] has offered.              Expected Discharge Plan: Skilled Nursing Facility Barriers to Discharge: Continued Medical Work up, SNF Pending bed offer   Patient Goals and CMS Choice Patient states their goals for this hospitalization and ongoing recovery are:: I hadn't thought about that CMS Medicare.gov Compare Post Acute Care list provided to:: Patient Choice offered to / list presented to : Patient      Expected Discharge Plan and Services In-house Referral: Clinical Social Work   Post Acute Care Choice: Skilled Nursing Facility Living arrangements for the past 2 months: Single Family Home                                      Prior Living Arrangements/Services Living arrangements for the past 2 months: Single Family Home Lives with:: Self Patient language and need for interpreter reviewed:: Yes Do you feel safe going back to the place where you live?: Yes      Need for Family Participation in Patient Care: Yes (Comment) Care giver support system in place?: Yes (comment) Current home services: Other (comment)  (none) Criminal Activity/Legal Involvement Pertinent to Current Situation/Hospitalization: No - Comment as needed  Activities of Daily Living   ADL Screening (condition at time of admission) Independently performs ADLs?: Yes (appropriate for developmental age) Is the patient deaf or have difficulty hearing?: Yes Does the patient have difficulty seeing, even when wearing glasses/contacts?: No Does the patient have difficulty concentrating, remembering, or making decisions?: No  Permission Sought/Granted Permission sought to share information with : Family Supports Permission granted to share information with : Yes, Verbal Permission Granted  Share Information with NAME: daughter Boice, daughter Verneita, son Arley  Permission granted to share info w AGENCY: SNF        Emotional Assessment Appearance:: Appears stated age Attitude/Demeanor/Rapport: Engaged Affect (typically observed): Appropriate, Pleasant Orientation: : Oriented to Self, Oriented to Place, Oriented to  Time, Oriented to Situation      Admission diagnosis:  Closed 2-part displaced fracture of surgical neck of left humerus, initial encounter [S42.222A] Closed displaced intertrochanteric fracture of left femur, initial encounter (HCC) [S72.142A] Fall, initial encounter [W19.XXXA] Intertrochanteric fracture of left femur, closed, initial encounter [facility]) [S72.142A] Patient Active Problem List   Diagnosis Date Noted   Intertrochanteric fracture of left femur, closed, initial encounter (HCC) 05/22/2024   Fall 05/22/2024   Closed 2-part displaced fracture of surgical neck of left humerus 05/22/2024  Type 2 diabetes mellitus with hyperglycemia, without long-term current use of insulin  (HCC) 05/22/2024   Atrial fibrillation, chronic (HCC) 05/22/2024   Ischemic stroke (HCC) 04/25/2022   Stage 3a chronic kidney disease (CKD) (HCC) 04/25/2022   Arthropathy 11/28/2021   Ascending aorta dilatation (HCC) 11/28/2021   Hardening of  the aorta (main artery of the heart) (HCC) 11/28/2021   Essential hypertension 11/28/2021   Insomnia 11/28/2021   Pure hypercholesterolemia 11/28/2021   Statin not tolerated 11/28/2021   Type 2 diabetes mellitus with other specified complication (HCC) 11/28/2021   Vitamin D  deficiency 11/28/2021   Osteoarthritis of left knee 04/14/2013   Meniscus, lateral, bucket handle tear, old 04/14/2013   Meniscus, medial, bucket handle tear, old 04/14/2013   PCP:  Claudene Pellet, MD Pharmacy:   [facility] [id number] - [place], [place] - [address] [address] Phone: [phone] Fax: [phone]     Social Drivers of Health (SDOH) Social History: SDOH Screenings   Food Insecurity: No Food Insecurity (05/22/2024)  Housing: Low Risk  (05/22/2024)  Transportation Needs: No Transportation Needs (05/22/2024)  Utilities: Not At Risk (05/22/2024)  Social Connections: Patient Declined (05/22/2024)  Tobacco Use: Low Risk  (05/23/2024)   SDOH Interventions:     Readmission Risk Interventions     No data to display

## 2024-05-25 NOTE — Evaluation (Signed)
 Occupational Therapy Evaluation Patient Details Name: Rachel Hines MRN: [id number] DOB: 12/26/1930 Today's Date: 05/25/2024   History of Present Illness   OLEVA KOO is a 89 y.o. female admitted 05/22/24 following fall sustaining left intertrochanteric proximal femur fracture and left proximal humerus fracture. Pt s/p IM nail of left hip 9/8. Continue non-op treatment of left shoulder. PMHx: A-fib on Eliquis , CVA, T2DM, HTN, HLD, OA, osteoporosis, and CKD 3A.     Clinical Impressions At baseline, pt is Ind to Mod I with ADLs and functional mobility. Pt performs light meal prep with microwave and manages medications with daughter performing medication checks. Family also assists with transportation and meals with family reporting pt often refuses further assistance from family. Pt with a history of falls. Pt now presents with decreased activity tolerance, pain affecting functional level, impaired cardiopulmonary status, impaired vision (at baseline), decreased cognition, generalized R UE weakness, decreased functional use of L UE secondary to fx, decreased knowledge of precautions, and decreased safety and independence with functional tasks. Pt currently largely completing ADLs with Min to Total assist +2 from bed level, bed mobility with Mod +2 to Total assist +2, and STS transfers with a Left platform walker with Mod assist +2. Pt c/o swimmy headed feeling immediately upon standing with significant drop in SBP and DBP noted. Returned pt to supine with BP 101/54 (69) and 91/51 (65) after 3 minutes. MD and RN notified of pt's vital sign response to session. Pt will benefit from acute skilled OT to address deficits and increase safety and independence with functional tasks. Post acute discharge pt will benefit from intensive inpatient skilled rehab services < 3 hours per day.      05/25/24 1108  Vital Signs  BP Location Right Arm  BP Method Automatic  Patient Position (if appropriate)  Orthostatic Vitals  Orthostatic Lying   BP- Lying 117/56  Pulse- Lying 82  Orthostatic Sitting  BP- Sitting 114/64  Pulse- Sitting 91  Orthostatic Standing at 0 minutes  BP- Standing at 0 minutes (!) 67/41 (Pt reported feeling swimmy headed immediately upon standing. She was unable to maintain static stance to obtain BP. BP read while seated EOB)  Pulse- Standing at 0 minutes 80        If plan is discharge home, recommend the following:   Two people to help with walking and/or transfers;Two people to help with bathing/dressing/bathroom;Assistance with cooking/housework;Direct supervision/assist for medications management;Assist for transportation;Direct supervision/assist for financial management;Help with stairs or ramp for entrance;Supervision due to cognitive status     Functional Status Assessment   Patient has had a recent decline in their functional status and demonstrates the ability to make significant improvements in function in a reasonable and predictable amount of time.     Equipment Recommendations   BSC/3in1;Tub/shower seat     Recommendations for Other Services         Precautions/Restrictions   Precautions Precautions: Fall Recall of Precautions/Restrictions: Impaired Precaution/Restrictions Comments: Vision Impairments (blind in R eye, fuzzy in L eye); Watch BP Required Braces or Orthoses: Sling (for comfort) Restrictions Weight Bearing Restrictions Per Provider Order: Yes LUE Weight Bearing Per Provider Order: Weight bear through elbow only LLE Weight Bearing Per Provider Order: Weight bearing as tolerated Other Position/Activity Restrictions: ROM as tolerated left hip and knee; Unrestricted ROM of elbow, forearm wrist and hand; Gentle shoulder flex/ext; Pendulums.     Mobility Bed Mobility Overal bed mobility: Needs Assistance Bed Mobility: Supine to Sit, Sit to Supine  Supine to sit: Mod assist, +2 for physical assistance, +2 for  safety/equipment, HOB elevated, Used rails Sit to supine: Total assist, +2 for physical assistance, +2 for safety/equipment   General bed mobility comments: Pt sat up on R side of bed with increased time. Assist to manage LUE and LLE. Pivoted pt to EOB with use of bed pad and scooted fwd til feet flat. Quickly returned pt to supine d/t symptomatic orthostatic hypotension. OT managed trunk and PT managed BLE. Repositioned using bed features and bed pad.    Transfers Overall transfer level: Needs assistance Equipment used: Left platform walker Transfers: Sit to/from Stand Sit to Stand: Mod assist, +2 physical assistance           General transfer comment: Pt stood from lowest bed height. Cued proper hand placement using RW. Powered up with modA x2. OT managing LUE and transitioning onto platform attachment. Pt c/o swimmy headed feeling. Attempted to assess BP in standing, but pt unable to tolerate. Quickly returned to sitting.      Balance Overall balance assessment: Needs assistance Sitting-balance support: Single extremity supported, Feet supported Sitting balance-Leahy Scale: Fair Sitting balance - Comments: Pt sat EOB with CGA-minA. Postural control: Right lateral lean (likely d/t pain avoidance. Able to achieve netural with VC.) Standing balance support: Bilateral upper extremity supported, During functional activity, Reliant on assistive device for balance Standing balance-Leahy Scale: Poor Standing balance comment: Pt dependent on platform walker and +2 assist. Maintained static stance for <15 seconds d/t onset of swimmy headed feeling                           ADL either performed or assessed with clinical judgement   ADL Overall ADL's : Needs assistance/impaired Eating/Feeding: Minimal assistance;Bed level   Grooming: Moderate assistance;Bed level   Upper Body Bathing: Maximal assistance;Bed level;Cueing for compensatory techniques   Lower Body Bathing:  Maximal assistance;Total assistance;+2 for physical assistance;+2 for safety/equipment;Bed level   Upper Body Dressing : Maximal assistance;Bed level   Lower Body Dressing: Total assistance;+2 for physical assistance;+2 for safety/equipment;Bed level     Toilet Transfer Details (indicate cue type and reason): unable at this time Toileting- Architect and Hygiene: Total assistance;+2 for physical assistance;+2 for safety/equipment;Bed level         General ADL Comments: Pt with decreased activity tolerance and limited by episode of orthostatic hypotension in standing this session     Vision Baseline Vision/History: 2 Legally blind (Family reports pt is legally blind in R eye at baseline and has significant vision loss in Left eye at baseline) Ability to See in Adequate Light: 3 Highly impaired Patient Visual Report: No change from baseline Additional Comments: Significant vision impairments at baseline     Perception         Praxis         Pertinent Vitals/Pain Pain Assessment Pain Assessment: Faces Faces Pain Scale: Hurts even more Pain Location: L shoulder and L hip Pain Descriptors / Indicators: Operative site guarding, Grimacing, Discomfort, Aching, Sore Pain Intervention(s): Limited activity within patient's tolerance, Monitored during session, Premedicated before session, Repositioned     Extremity/Trunk Assessment Upper Extremity Assessment Upper Extremity Assessment: Right hand dominant;Generalized weakness;LUE deficits/detail;RUE deficits/detail RUE Deficits / Details: generalized weakness LUE Deficits / Details: AAROM/AROM in elbow, forearm, wrist, hand all WFL; AAROM/PROM shoulder flexion/extension to approximately 100 degrees; noted bruising throughout UE; noted approximately 0.25 skin tag/nodule in bend of elbow which pt reports is  present at baseline   Lower Extremity Assessment Lower Extremity Assessment: Defer to PT evaluation   Cervical /  Trunk Assessment Cervical / Trunk Assessment: Normal   Communication Communication Communication: Impaired Factors Affecting Communication: Hearing impaired (has hearing aids, but hasn't helped much)   Cognition Arousal: Alert Behavior During Therapy: WFL for tasks assessed/performed Cognition: Cognition impaired   Orientation impairments: Time Awareness: Intellectual awareness intact, Online awareness intact (intermittent online awareness) Memory impairment (select all impairments): Working memory Attention impairment (select first level of impairment): Selective attention Executive functioning impairment (select all impairments): Reasoning, Problem solving, Organization OT - Cognition Comments: AAOx3 and pleasant throughout session. Family report pt gets delerium while in the hospital. Family also reports pt often requeses assistance with tasks at home, including refusing assistance with setting up a medication planner. When asked this session what she would do if she was unable to read labels of new medications or uncertain how to take a new medication, pt initially stated she hasn't had a new medicaiton in a long time. When asked what she would do if she gets a new medication going home from this hospitalization she stated she would allow her daughter to assist.                 Following commands: Impaired Following commands impaired: Follows one step commands with increased time, Follows multi-step commands inconsistently, Follows multi-step commands with increased time     Cueing  General Comments   Cueing Techniques: Verbal cues;Gestural cues;Tactile cues;Visual cues  Pt with positive orthostatic hypotension. Reassessed BP in supine: 101/54 (69); after 3 mins 91/51 (65). RN and MD notified. Pt's daughters presetna dn supportive throughout session.   Exercises Exercises: General Upper Extremity, Hand exercises General Exercises - Upper Extremity Shoulder Flexion: PROM,  AAROM, Left, 5 reps, Supine Shoulder Extension: PROM, AAROM, Left, 5 reps Elbow Flexion: AROM, AAROM, Left, 10 reps, Supine Elbow Extension: AROM, AAROM, 10 reps, Supine, Left Wrist Flexion: AROM, AAROM, 10 reps, Supine, Left Wrist Extension: AROM, AAROM, 10 reps, Supine, Left Digit Composite Flexion: AROM, Left, 10 reps, Supine Composite Extension: AROM, Left, 10 reps, Supine Hand Exercises Forearm Supination: AROM, AAROM, 5 reps, Supine, Left Forearm Pronation: AROM, AAROM, 5 reps, Supine, Left Wrist Ulnar Deviation: AROM, AAROM, Left, 5 reps, Supine Wrist Radial Deviation: AROM, AAROM, Left, 5 reps, Supine   Shoulder Instructions      Home Living Family/patient expects to be discharged to:: Private residence Living Arrangements: Alone Available Help at Discharge: Family;Available PRN/intermittently (Daughters live nearby and could check in sometimes) Type of Home: House (TownHouse) Home Access: Stairs to enter Secretary/administrator of Steps: 1 (small step) Entrance Stairs-Rails: None Home Layout: Two level;Bed/bath upstairs;1/2 bath on main level Alternate Level Stairs-Number of Steps: 6+8 (very narrow; landing in between) Alternate Level Stairs-Rails: Right;Left (first part R handrail, landing, second part L handrail) Bathroom Shower/Tub: Walk-in shower;Tub only   Bathroom Toilet: Handicapped height (downstairs, the primary bathroom she uses; Standard upstairs)     Home Equipment: Tub bench;Hand held shower head;Cane - single point          Prior Functioning/Environment Prior Level of Function : Independent/Modified Independent;History of Falls (last six months)             Mobility Comments: Ambulates by furniture walking and/or walking using SPC. Pt reports ascending stairs sideways with BUE support on hnadrail and descending backwards. She lives her house everyday to check her mail using SPC. 3 falls in the past year, 2/3 resulting  in fx. 1 fall a little over a  year ago result in clavicle fx. Pt reports she gets swimmy headed when she bends over for too longer or gets up too quick. ADLs Comments: Indep with ADLs. Pt makes meals using the microwave. Daughters bring her food to fill up the fridge. Pt manages her own medications, but reports impaired vision so daughters will check in on it. Pt states she will mark down on her calendar when she takes her pills. Daughter reports pt will not allow her to manage the pill box. Relies on family for transportation.    OT Problem List: Decreased strength;Decreased activity tolerance;Decreased range of motion;Impaired balance (sitting and/or standing);Decreased cognition;Decreased safety awareness;Decreased knowledge of use of DME or AE;Decreased knowledge of precautions;Cardiopulmonary status limiting activity;Impaired UE functional use;Pain   OT Treatment/Interventions: Self-care/ADL training;Therapeutic exercise;Energy conservation;DME and/or AE instruction;Therapeutic activities;Cognitive remediation/compensation;Patient/family education;Balance training;Visual/perceptual remediation/compensation      OT Goals(Current goals can be found in the care plan section)   Acute Rehab OT Goals Patient Stated Goal: to have less pain, feel better, and be able to return home OT Goal Formulation: With patient/family Time For Goal Achievement: 06/08/24 Potential to Achieve Goals: Fair ADL Goals Pt Will Perform Grooming: with contact guard assist;sitting Pt Will Perform Upper Body Bathing: with min assist;sitting Pt Will Perform Upper Body Dressing: with min assist;sitting Pt Will Perform Lower Body Dressing: with mod assist;sitting/lateral leans;sit to/from stand Pt Will Transfer to Toilet: with min assist;ambulating;bedside commode (with least restrictive AD; adhering to L UE/LE precautions) Pt Will Perform Toileting - Clothing Manipulation and hygiene: with mod assist;sit to/from stand;sitting/lateral  leans Pt/caregiver will Perform Home Exercise Program: Left upper extremity;With minimal assist;With written HEP provided (following physician's orders)   OT Frequency:  Min 2X/week    Co-evaluation PT/OT/SLP Co-Evaluation/Treatment: Yes Reason for Co-Treatment: Necessary to address cognition/behavior during functional activity;For patient/therapist safety   OT goals addressed during session: ADL's and self-care;Strengthening/ROM      AM-PAC OT 6 Clicks Daily Activity     Outcome Measure Help from another person eating meals?: A Little Help from another person taking care of personal grooming?: A Lot Help from another person toileting, which includes using toliet, bedpan, or urinal?: Total Help from another person bathing (including washing, rinsing, drying)?: A Lot Help from another person to put on and taking off regular upper body clothing?: A Lot Help from another person to put on and taking off regular lower body clothing?: Total 6 Click Score: 11   End of Session Equipment Utilized During Treatment: Gait belt;Other (comment) (Left platform walker; L UE sling) Nurse Communication: Mobility status;Other (comment) (Pt with episode of orthostatic hypotension)  Activity Tolerance: Patient tolerated treatment well;Treatment limited secondary to medical complications (Comment);Patient limited by pain (Limited by episode of orthostatic hypotension) Patient left: in bed;with call bell/phone within reach;with bed alarm set  OT Visit Diagnosis: Unsteadiness on feet (R26.81);Other abnormalities of gait and mobility (R26.89);Repeated falls (R29.6);Muscle weakness (generalized) (M62.81);History of falling (Z91.81);Pain                Time: 8944-8852 OT Time Calculation (min): 52 min Charges:  OT General Charges $OT Visit: 1 Visit OT Evaluation $OT Eval Moderate Complexity: 1 Mod OT Treatments $Therapeutic Exercise: 8-22 mins  Margarie Rockey HERO., OTR/L, MA Acute Rehab [phone]    Margarie FORBES Horns 05/25/2024, 4:11 PM

## 2024-05-25 NOTE — Evaluation (Signed)
 Physical Therapy Evaluation Patient Details Name: Rachel Hines MRN: [id number] DOB: 1930-10-03 Today's Date: 05/25/2024  History of Present Illness  Rachel Hines is a 89 y.o. female admitted 05/22/24 following fall sustaining left intertrochanteric proximal femur fracture and left proximal humerus fracture. Pt s/p IM nail of left hip 9/8. Continue non-op treatment of left shoulder. PMHx: A-fib on Eliquis , CVA, T2DM, HTN, HLD, OA, osteoporosis, and CKD 3A.   Clinical Impression  Pt admitted with above diagnosis. PTA, pt was modI for functional mobility by furniture walking and/or using SPC, modI with ADLs, and relies on family for IADLs. She lives alone in a two story townhouse where the bed and full bathroom are located upstairs. Pt currently with functional limitations due to the deficits listed below (see PT Problem List). She required modA x2 for bed mobility and modA x2 for sit<>stand using left platform walker. Examination limited by symptomatic orthostatic hypotension. Pt c/o swimmy headed feeling immediately upon standing with significant drop in SBP and DBP noted. Returned pt to supine with BP 101/54 (69) and 91/51 (65) after 3 minutes. MD and RN notified of pt's vital sign response to session. Pt will benefit from acute skilled PT to increase her independence and safety with mobility to allow discharge. Recommend continued inpatient follow up therapy, <3 hours/day.   05/25/24 1108  Vital Signs  BP Location Right Arm  BP Method Automatic  Patient Position (if appropriate) Orthostatic Vitals  Orthostatic Lying   BP- Lying 117/56  Pulse- Lying 82  Orthostatic Sitting  BP- Sitting 114/64  Pulse- Sitting 91  Orthostatic Standing at 0 minutes  BP- Standing at 0 minutes (!) 67/41 (Pt reported feeling swimmy headed immediately upon standing. She was unable to maintain static stance to obtain BP. BP read while seated EOB)  Pulse- Standing at 0 minutes 80         If plan is  discharge home, recommend the following: Two people to help with walking and/or transfers;Two people to help with bathing/dressing/bathroom;Assistance with cooking/housework;Assist for transportation;Help with stairs or ramp for entrance   Can travel by private vehicle   No    Equipment Recommendations Hospital bed;Hoyer lift;Wheelchair (measurements PT);Wheelchair cushion (measurements PT);Rolling walker (2 wheels)  Recommendations for Other Services       Functional Status Assessment Patient has had a recent decline in their functional status and demonstrates the ability to make significant improvements in function in a reasonable and predictable amount of time.     Precautions / Restrictions Precautions Precautions: Fall Recall of Precautions/Restrictions: Impaired Precaution/Restrictions Comments: Vision Impairements (blind in R eye, fuzzy in L eye); Watch BP. Reviewed weight bearing status and ROM precautions with pt/family. Required Braces or Orthoses: Sling (for comfort) Restrictions Weight Bearing Restrictions Per Provider Order: Yes LUE Weight Bearing Per Provider Order: Weight bear through elbow only LLE Weight Bearing Per Provider Order: Weight bearing as tolerated Other Position/Activity Restrictions: ROM as tolerated left hip and knee; Unrestricted ROM of elbow, forearm wrist and hand; Gentle shoulder flex/ext; Pendulums.      Mobility  Bed Mobility Overal bed mobility: Needs Assistance Bed Mobility: Supine to Sit, Sit to Supine     Supine to sit: Mod assist, +2 for physical assistance, +2 for safety/equipment, HOB elevated, Used rails Sit to supine: Total assist, +2 for physical assistance, +2 for safety/equipment   General bed mobility comments: Pt sat up on R side of bed with increased time. Assist to manage LUE and LLE. Pivoted pt to EOB with  use of bed pad and scooted fwd til feet flat. Quickly returned pt to supine d/t symptomatic orthostatic hypotension. OT  managed trunk and PT managed BLE. Repositioned using bed features and bed pad.    Transfers Overall transfer level: Needs assistance Equipment used: Left platform walker Transfers: Sit to/from Stand Sit to Stand: Mod assist, +2 physical assistance           General transfer comment: Pt stood from lowest bed height. Cued proper hand placement using RW. Powered up with modA x2. OT managing LUE and transitioning onto platform attachment. Pt c/o swimmy headed feeling. Attempted to assess BP in standing, but pt unable to tolerate. Quickly returned to sitting.    Ambulation/Gait               General Gait Details: Deferred further mobility attempts d/t symptomatic orthostatic hypotension  Stairs            Wheelchair Mobility     Tilt Bed    Modified Rankin (Stroke Patients Only)       Balance Overall balance assessment: Needs assistance Sitting-balance support: Single extremity supported, Feet supported Sitting balance-Leahy Scale: Fair Sitting balance - Comments: Pt sat EOB with CGA-minA. Postural control: Right lateral lean (likely d/t pain avoidance. Able to achieve netural with VC.) Standing balance support: Bilateral upper extremity supported, During functional activity, Reliant on assistive device for balance Standing balance-Leahy Scale: Poor Standing balance comment: Pt dependent on platform walker and +2 assist. Maintained static stance for <15 seconds d/t onset of swimmy headed feeling                             Pertinent Vitals/Pain Pain Assessment Pain Assessment: Faces Faces Pain Scale: Hurts even more Pain Location: L shoulder and L hip Pain Descriptors / Indicators: Operative site guarding, Grimacing, Discomfort, Aching, Sore Pain Intervention(s): Premedicated before session, Monitored during session, Limited activity within patient's tolerance, Repositioned    Home Living Family/patient expects to be discharged to:: Private  residence Living Arrangements: Alone Available Help at Discharge: Family;Available PRN/intermittently (Daughters live nearby and could check in sometimes) Type of Home: House (TownHouse) Home Access: Stairs to enter Entrance Stairs-Rails: None Entrance Stairs-Number of Steps: 1 (small step) Alternate Level Stairs-Number of Steps: 6+8 (very narrow; landing in between) Home Layout: Two level;Bed/bath upstairs;1/2 bath on main level Home Equipment: Tub bench;Hand held shower head;Cane - single point      Prior Function Prior Level of Function : Independent/Modified Independent;History of Falls (last six months)             Mobility Comments: Ambulates by furniture walking and/or walking using SPC. Pt reports ascending stairs sideways with BUE support on hnadrail and descending backwards. She lives her house everyday to check her mail using SPC. 3 falls in the past year, 2/3 resulting in fx. 1 fall a little over a year ago result in clavicle fx. Pt reports she gets swimmy headed when she bends over for too longer or gets up too quick. ADLs Comments: Indep with ADLs. Pt makes meals using the microwave. Daughters bring her food to fill up the fridge. Pt manages her own medications, but reports impaired vision so daughters will check in on it. Pt states she will mark down on her calendar when she takes her pills. Daughter reports pt will not allow her to manage the pill box. Relies on family for transportation.     Extremity/Trunk Assessment  Upper Extremity Assessment Upper Extremity Assessment: Defer to OT evaluation    Lower Extremity Assessment Lower Extremity Assessment: LLE deficits/detail LLE Deficits / Details: Pt with limited hip and knee AROM. Ankle AROM/strength [facility]. Grossly 2+/5 hip and knee strength. LLE: Unable to fully assess due to pain LLE Sensation: decreased proprioception LLE Coordination: decreased gross motor    Cervical / Trunk Assessment Cervical / Trunk  Assessment: Normal  Communication   Communication Communication: Impaired Factors Affecting Communication: Hearing impaired (has hearing aids, but hasn't helped much)    Cognition Arousal: Alert Behavior During Therapy: WFL for tasks assessed/performed   PT - Cognitive impairments: Orientation, Sequencing, Problem solving, Safety/Judgement   Orientation impairments: Time (Pt knew she was in the hospital but not the name. She got the correct city. Pt reported it was October 2022.)                   PT - Cognition Comments: Pt A,Ox3. She is slightly confused and required increased time to follow commands. Daughters report she gets delirium in the hospital. Pt refuses assistances from family at home. Following commands: Impaired Following commands impaired: Follows one step commands with increased time, Follows multi-step commands inconsistently, Follows multi-step commands with increased time     Cueing Cueing Techniques: Verbal cues, Gestural cues, Tactile cues, Visual cues     General Comments General comments (skin integrity, edema, etc.): Pt with positive orthostatic hypotension. Reassessed BP in supine: 101/54 (69); after 3 mins 91/51 (65). RN and MD notified.    Exercises     Assessment/Plan    PT Assessment Patient needs continued PT services  PT Problem List Decreased strength;Decreased range of motion;Decreased activity tolerance;Decreased balance;Decreased mobility;Decreased knowledge of use of DME;Decreased safety awareness;Decreased knowledge of precautions;Cardiopulmonary status limiting activity;Pain       PT Treatment Interventions DME instruction;Gait training;Stair training;Functional mobility training;Therapeutic activities;Therapeutic exercise;Balance training;Patient/family education    PT Goals (Current goals can be found in the Care Plan section)  Acute Rehab PT Goals Patient Stated Goal: Get stronger with therapy PT Goal Formulation: With  patient/family Time For Goal Achievement: 06/08/24 Potential to Achieve Goals: Fair    Frequency Min 2X/week     Co-evaluation PT/OT/SLP Co-Evaluation/Treatment: Yes Reason for Co-Treatment: Necessary to address cognition/behavior during functional activity;For patient/therapist safety PT goals addressed during session: Mobility/safety with mobility;Balance;Proper use of DME OT goals addressed during session: ADL's and self-care;Strengthening/ROM       AM-PAC PT 6 Clicks Mobility  Outcome Measure Help needed turning from your back to your side while in a flat bed without using bedrails?: Total Help needed moving from lying on your back to sitting on the side of a flat bed without using bedrails?: Total Help needed moving to and from a bed to a chair (including a wheelchair)?: Total Help needed standing up from a chair using your arms (e.g., wheelchair or bedside chair)?: Total Help needed to walk in hospital room?: Total Help needed climbing 3-5 steps with a railing? : Total 6 Click Score: 6    End of Session Equipment Utilized During Treatment: Gait belt Activity Tolerance: Treatment limited secondary to medical complications (Comment) (symptomatic orthostatic hypotension) Patient left: in bed;with call bell/phone within reach;with bed alarm set;with family/visitor present Nurse Communication: Mobility status;Other (comment) (VS response to activity, symptomatic orthostatic hypotension.) PT Visit Diagnosis: Difficulty in walking, not elsewhere classified (R26.2);Dizziness and giddiness (R42);Other abnormalities of gait and mobility (R26.89);Unsteadiness on feet (R26.81)    Time: 8944-8852 PT Time Calculation (min) (ACUTE  ONLY): 52 min   Charges:   PT Evaluation $PT Eval Moderate Complexity: 1 Mod PT Treatments $Therapeutic Activity: 8-22 mins PT General Charges $$ ACUTE PT VISIT: 1 Visit         Randall SAUNDERS, PT, DPT Acute Rehabilitation Services Office:  [phone] Secure Chat Preferred  Delon CHRISTELLA Callander 05/25/2024, 1:08 PM

## 2024-05-25 NOTE — NC FL2 (Signed)
 [place]  MEDICAID FL2 LEVEL OF CARE FORM     IDENTIFICATION  Patient Name: Rachel Hines Birthdate: 23-Jan-1931 Sex: female Admission Date (Current Location): 05/22/2024  [facility] and [place] Number:  Producer, television/film/video and Address:  [facility], [address] [place], [place] [address]      Provider Number: [id number]  Attending Physician Name and Address:  Drusilla Sabas RAMAN, MD  Relative Name and Phone Number:  Gwenn Males Daughter   [phone]    Current Level of Care: Hospital Recommended Level of Care: Skilled Nursing Facility Prior Approval Number:    Date Approved/Denied:   PASRR Number: [id number]  Discharge Plan: SNF    Current Diagnoses: Patient Active Problem List   Diagnosis Date Noted   Intertrochanteric fracture of left femur, closed, initial encounter (HCC) 05/22/2024   Fall 05/22/2024   Closed 2-part displaced fracture of surgical neck of left humerus 05/22/2024   Type 2 diabetes mellitus with hyperglycemia, without long-term current use of insulin  (HCC) 05/22/2024   Atrial fibrillation, chronic (HCC) 05/22/2024   Ischemic stroke (HCC) 04/25/2022   Stage 3a chronic kidney disease (CKD) (HCC) 04/25/2022   Arthropathy 11/28/2021   Ascending aorta dilatation (HCC) 11/28/2021   Hardening of the aorta (main artery of the heart) (HCC) 11/28/2021   Essential hypertension 11/28/2021   Insomnia 11/28/2021   Pure hypercholesterolemia 11/28/2021   Statin not tolerated 11/28/2021   Type 2 diabetes mellitus with other specified complication (HCC) 11/28/2021   Vitamin D  deficiency 11/28/2021   Osteoarthritis of left knee 04/14/2013   Meniscus, lateral, bucket handle tear, old 04/14/2013   Meniscus, medial, bucket handle tear, old 04/14/2013    Orientation RESPIRATION BLADDER Height & Weight     Self, Time, Situation, Place  Normal Incontinent Weight: 150 lb (68 kg) Height:  5' 3 (160 cm)  BEHAVIORAL SYMPTOMS/MOOD NEUROLOGICAL  BOWEL NUTRITION STATUS        Diet (see discharge summary)  AMBULATORY STATUS COMMUNICATION OF NEEDS Skin   Extensive Assist Verbally Surgical wounds, Other (Comment) (ecchymosis)                       Personal Care Assistance Level of Assistance  Bathing, Feeding, Dressing Bathing Assistance: Maximum assistance Feeding assistance: Limited assistance Dressing Assistance: Maximum assistance     Functional Limitations Info  Sight, Hearing, Speech Sight Info: Impaired Hearing Info: Impaired Speech Info: Adequate    SPECIAL CARE FACTORS FREQUENCY  PT (By licensed PT), OT (By licensed OT)     PT Frequency: 5x week OT Frequency: 5x week            Contractures Contractures Info: Not present    Additional Factors Info  Code Status, Allergies, Insulin  Sliding Scale Code Status Info: full Allergies Info: Atorvastatin, Other   Insulin  Sliding Scale Info: Novolog : see discharge summary       Current Medications (05/25/2024):  This is the current hospital active medication list Current Facility-Administered Medications  Medication Dose Route Frequency Provider Last Rate Last Admin   0.9 %  sodium chloride  infusion (Manually program via Guardrails IV Fluids)   Intravenous Once Blondie Lynwood POUR, NP       0.9 %  sodium chloride  infusion (Manually program via Guardrails IV Fluids)   Intravenous Once Lenon Marien CROME, MD       acetaminophen  (TYLENOL ) tablet 1,000 mg  1,000 mg Oral TID Deward Eck, PA-C   1,000 mg at 05/25/24 9045   acetaminophen  (  TYLENOL ) tablet 325-650 mg  325-650 mg Oral Q6H PRN Deward Eck, PA-C       bisacodyl  (DULCOLAX) EC tablet 5 mg  5 mg Oral Daily PRN Deward Eck, PA-C       cholecalciferol  (VITAMIN D3) 25 MCG (1000 UNIT) tablet 2,000 Units  2,000 Units Oral BID Deward Eck, PA-C   2,000 Units at 05/25/24 9045   fentaNYL  (SUBLIMAZE ) injection 12.5 mcg  12.5 mcg Intravenous Once PRN Deward Eck, PA-C       HYDROmorphone  (DILAUDID ) injection 1 mg  1 mg  Intravenous Q6H PRN Deward Eck, PA-C       insulin  aspart (novoLOG ) injection 0-9 Units  0-9 Units Subcutaneous TID WC Anderson, Chelsey L, MD   1 Units at 05/25/24 1251   menthol -cetylpyridinium (CEPACOL) lozenge 3 mg  1 lozenge Oral PRN Deward Eck, PA-C       Or   phenol (CHLORASEPTIC) mouth spray 1 spray  1 spray Mouth/Throat PRN Deward Eck, PA-C       ondansetron  (ZOFRAN ) tablet 4 mg  4 mg Oral Q6H PRN Deward Eck, PA-C       Or   ondansetron  (ZOFRAN ) injection 4 mg  4 mg Intravenous Q6H PRN Deward Eck, PA-C   4 mg at 05/23/24 0802   oxyCODONE  (Oxy IR/ROXICODONE ) immediate release tablet 2.5-5 mg  2.5-5 mg Oral Q4H PRN Deward Eck, PA-C   5 mg at 05/25/24 0953   polyethylene glycol (MIRALAX  / GLYCOLAX ) packet 17 g  17 g Oral BID Lenon Marien CROME, MD   17 g at 05/25/24 9043   rosuvastatin  (CRESTOR ) tablet 5 mg  5 mg Oral Daily Deward Eck, PA-C   5 mg at 05/25/24 9045   Vitamin D  (Ergocalciferol ) (DRISDOL ) 1.25 MG (50000 UNIT) capsule 50,000 Units  50,000 Units Oral Q7 days Deward Eck, PA-C   50,000 Units at 05/24/24 1758   zolpidem  (AMBIEN ) tablet 5 mg  5 mg Oral QHS Deward Eck, PA-C   5 mg at 05/24/24 2059     Discharge Medications: Please see discharge summary for a list of discharge medications.  Relevant Imaging Results:  Relevant Lab Results:   Additional Information SSN: [id number]  Bridget Cordella Simmonds, LCSW

## 2024-05-25 NOTE — Progress Notes (Signed)
 [facility]  PROGRESS NOTE  Rachel Hines FMW:[id number] DOB: Jan 18, 1931 DOA: 05/22/2024 PCP: Claudene Pellet, MD   Brief HPI:    89 y.o. female with a PMH significant for A-fib on Eliquis , CVA, T2DM, HTN, HLD, arthritis and CKD 3A who presents to the ED for evaluation of left hip and shoulder pain after a fall.  ED Course: Initial vitals show patient afebrile and normotensive. Initial labs significant for glucose 171, creatinine 0.91, WBC 12.3, Hgb 9.1. EKG shows A-fib with slightly prolonged QTc of 490. CXR shows no acute cardiopulmonary disease.  X-ray of the left shoulder shows acute mildly impacted fracture of the proximal left humerus and old left clavicle fracture.  X-ray of the pelvis shows acute, comminuted fracture of the proximal left femur. CT head/ cervical spine negative acute. Pt received IV Zofran , IV morphine  and IV Dilaudid .  Orthopedic surgery was consulted for evaluation.    05/24/24 -humerus fracture treated conservatively. Ortho performed hip fixation 9/8.  Requiring blood transfusion today. Significant hematoma of left humerus fracture    Assessment/Plan:   Mechanical fall # Left proximal hip fracture # Left proximal humerus fracture - Patient presented with left hip and humerus pain after tripping and and falling on her left side at home - X-ray of the left shoulder shows acute mildly impacted fracture of the proximal left humerus and X-ray of the pelvis shows acute, comminuted fracture of the proximal left femur. - Orthopedic surgery consulted, plan for IM nailing of the left hip and nonoperative treatment of the left humerus - Continue left shoulder immobilizer - Pain control with scheduled Tylenol  and as needed IV Dilaudid  - Fall precautions - PT, OT   Acute blood loss anemia Hematoma of left bicep- see clinical image for further detail - hgb dropped to 6.5 on 05/24/2024 -Status post 1 unit PRBC, hemoglobin improved to 7.4 today -Follow CBC in a.m.  #  T2DM with hyperglycemia - A1c 5.8 - sSSI   # A-fib- mildly elevated HR. Decreased home atenolol  due to hypotension but will hopefully be able to increase dose back up to home after transfusion.  - restart eliquis  when hgb stabilizes  - Telemetry  # Vitamin D  deficiency -Vitamin D  level 8.69 -Continue vitamin D  50,000 units oral q. 7 days   # HTN- BP stable.  SBP dropped to 80s on standing - decreased atenolol  and hold olmesartan  (Irbesartan  as substitute) -Will hold atenolol   Acute kidney injury -Likely in setting of hypotension, dehydration -Start LR at 75 mL/h -Follow BMP in am   # HLD - Continue rosuvastatin    # Insomnia - Continue Ambien  at bedtime   Body mass index is 26.57 kg/m.    Medications     sodium chloride    Intravenous Once   sodium chloride    Intravenous Once   acetaminophen   1,000 mg Oral TID   cholecalciferol   2,000 Units Oral BID   insulin  aspart  0-9 Units Subcutaneous TID WC   polyethylene glycol  17 g Oral BID   rosuvastatin   5 mg Oral Daily   Vitamin D  (Ergocalciferol )  50,000 Units Oral Q7 days   zolpidem   5 mg Oral QHS     Data Reviewed:   CBG:  Recent Labs  Lab 05/24/24 1614 05/24/24 2119 05/25/24 0605 05/25/24 1103 05/25/24 1624  GLUCAP 177* 147* 128* 135* 135*    SpO2: 96 % O2 Flow Rate (L/min): 2 L/min    Vitals:   05/24/24 1618 05/24/24 1945 05/25/24 0525 05/25/24 1619  BP: (!) 103/49 107/68 131/66 (!) 83/61  Pulse: 91 94 98 85  Resp: 16 17  16   Temp: 98 F (36.7 C) 98.2 F (36.8 C) 98.1 F (36.7 C) 98 F (36.7 C)  TempSrc: Oral Oral Oral Oral  SpO2:  94% 97% 96%  Weight:      Height:          Data Reviewed:  Basic Metabolic Panel: Recent Labs  Lab 05/22/24 1648 05/23/24 0706 05/24/24 0434  NA 139 140 137  K 4.2 5.1 4.7  CL 106 105 108  CO2 22 22 19*  GLUCOSE 171* 156* 192*  BUN 23 31* 47*  CREATININE 0.91 1.44* 1.94*  CALCIUM  8.4* 8.2* 7.9*    CBC: Recent Labs  Lab 05/22/24 1648  05/23/24 0706 05/24/24 0434 05/25/24 0442  WBC 12.3* 12.3* 12.7* 14.1*  NEUTROABS 10.6*  --   --   --   HGB 9.1* 8.3* 6.5* 7.4*  HCT 29.7* 26.5* 20.1* 22.4*  MCV 92.5 92.0 90.1 88.9  PLT 145* 176 122* 132*    LFT Recent Labs  Lab 05/22/24 1648  AST 21  ALT 15  ALKPHOS 87  BILITOT 0.5  PROT 5.5*  ALBUMIN 3.4*     Antibiotics: Anti-infectives (From admission, onward)    Start     Dose/Rate Route Frequency Ordered Stop   05/24/24 0830  ceFAZolin  (ANCEF ) IVPB 2g/100 mL premix        2 g 200 mL/hr over 30 Minutes Intravenous Every 12 hours 05/24/24 0801 05/24/24 2131   05/23/24 1200  ceFAZolin  (ANCEF ) IVPB 2g/100 mL premix  Status:  Discontinued        2 g 200 mL/hr over 30 Minutes Intravenous On call to O.R. 05/22/24 2016 05/24/24 0559        DVT prophylaxis: SCDs  Code Status: Full code  Family Communication: Discussed with patient's family at bedside   CONSULTS    Subjective   Blood pressure dropped on standing   Objective    Physical Examination:   General-appears in no acute distress Heart-S1-S2, regular, no murmur auscultated Lungs-clear to auscultation bilaterally, no wheezing or crackles auscultated Abdomen-soft, nontender, no organomegaly Extremities-no edema in the lower extremities Neuro-alert, oriented x3, no focal deficit noted  Status is: Inpatient:             [name]   [facility] If 7PM-7AM, please contact night-coverage at [email or web address], Office  [phone]   05/25/2024, 4:46 PM  LOS: 3 days

## 2024-05-26 DIAGNOSIS — E1165 Type 2 diabetes mellitus with hyperglycemia: Secondary | ICD-10-CM | POA: Diagnosis not present

## 2024-05-26 DIAGNOSIS — I482 Chronic atrial fibrillation, unspecified: Secondary | ICD-10-CM | POA: Diagnosis not present

## 2024-05-26 DIAGNOSIS — S72142A Displaced intertrochanteric fracture of left femur, initial encounter for closed fracture: Secondary | ICD-10-CM | POA: Diagnosis not present

## 2024-05-26 LAB — COMPREHENSIVE METABOLIC PANEL WITH GFR
ALT: 7 U/L (ref 0–44)
AST: 20 U/L (ref 15–41)
Albumin: 2.6 g/dL — ABNORMAL LOW (ref 3.5–5.0)
Alkaline Phosphatase: 57 U/L (ref 38–126)
Anion gap: 9 (ref 5–15)
BUN: 42 mg/dL — ABNORMAL HIGH (ref 8–23)
CO2: 24 mmol/L (ref 22–32)
Calcium: 8 mg/dL — ABNORMAL LOW (ref 8.9–10.3)
Chloride: 104 mmol/L (ref 98–111)
Creatinine, Ser: 1.22 mg/dL — ABNORMAL HIGH (ref 0.44–1.00)
GFR, Estimated: 42 mL/min — ABNORMAL LOW (ref >60)
Glucose, Bld: 118 mg/dL — ABNORMAL HIGH (ref 70–99)
Potassium: 4.5 mmol/L (ref 3.5–5.1)
Sodium: 137 mmol/L (ref 135–145)
Total Bilirubin: 0.9 mg/dL (ref 0.0–1.2)
Total Protein: 5.2 g/dL — ABNORMAL LOW (ref 6.5–8.1)

## 2024-05-26 LAB — CBC
HCT: 22.2 % — ABNORMAL LOW (ref 36.0–46.0)
Hemoglobin: 7.2 g/dL — ABNORMAL LOW (ref 12.0–15.0)
MCH: 29.5 pg (ref 26.0–34.0)
MCHC: 32.4 g/dL (ref 30.0–36.0)
MCV: 91 fL (ref 80.0–100.0)
Platelets: 169 K/uL (ref 150–400)
RBC: 2.44 MIL/uL — ABNORMAL LOW (ref 3.87–5.11)
RDW: 13.7 % (ref 11.5–15.5)
WBC: 11.9 K/uL — ABNORMAL HIGH (ref 4.0–10.5)
nRBC: 0.3 % — ABNORMAL HIGH (ref 0.0–0.2)

## 2024-05-26 LAB — GLUCOSE, CAPILLARY
Glucose-Capillary: 114 mg/dL — ABNORMAL HIGH (ref 70–99)
Glucose-Capillary: 158 mg/dL — ABNORMAL HIGH (ref 70–99)
Glucose-Capillary: 218 mg/dL — ABNORMAL HIGH (ref 70–99)
Glucose-Capillary: 113 mg/dL — ABNORMAL HIGH (ref 70–99)

## 2024-05-26 MED ORDER — OXYCODONE HCL 5 MG PO TABS: 2.5000 mg | ORAL_TABLET | ORAL | 0 refills | Status: DC | PRN

## 2024-05-26 MED ORDER — ACETAMINOPHEN 500 MG PO TABS: 1000.0000 mg | ORAL_TABLET | Freq: Three times a day (TID) | ORAL | 0 refills | Status: AC | PRN

## 2024-05-26 MED ORDER — VITAMIN D3 25 MCG PO TABS: 2000.0000 [IU] | ORAL_TABLET | Freq: Two times a day (BID) | ORAL | 1 refills | Status: DC

## 2024-05-26 MED ORDER — VITAMIN D (ERGOCALCIFEROL) 1.25 MG (50000 UNIT) PO CAPS: 50000.0000 [IU] | ORAL_CAPSULE | ORAL | 2 refills | Status: DC

## 2024-05-26 NOTE — TOC Progression Note (Addendum)
 Transition of Care [facility]) - Progression Note    Patient Details  Name: Rachel Hines MRN: [id number] Date of Birth: Feb 10, 1931  Transition of [facility]) CM/SW Contact  Bridget Cordella Simmonds, LCSW Phone Number: 05/26/2024, 8:56 AM  Clinical Narrative:   Bed offers provided to daughter [name].  Family is going to move forward with trying to get pt in long term at either [facility], [facility], or [facility].  Pt has offer at [facility].  [name] asked CSW to check with the other two to see if any STR beds available that could potential transition to being resident.  CSW reached out to both facilities.   1045: [facility] does offer bed.  [facility] cannot offer bed.  [name] updated, would like to accept offer at [facility].   Medicare payer with inpt order 9/7.  MD notified.   Expected Discharge Plan: Skilled Nursing Facility Barriers to Discharge: Continued Medical Work up, SNF Pending bed offer               Expected Discharge Plan and Services In-house Referral: Clinical Social Work   Post Acute Care Choice: Skilled Nursing Facility Living arrangements for the past 2 months: Single Family Home                                       Social Drivers of Health (SDOH) Interventions SDOH Screenings   Food Insecurity: No Food Insecurity (05/22/2024)  Housing: Low Risk  (05/22/2024)  Transportation Needs: No Transportation Needs (05/22/2024)  Utilities: Not At Risk (05/22/2024)  Social Connections: Patient Declined (05/22/2024)  Tobacco Use: Low Risk  (05/23/2024)    Readmission Risk Interventions     No data to display

## 2024-05-26 NOTE — Discharge Instructions (Addendum)
 Orthopaedic Trauma Service Discharge Instructions   General Discharge Instructions  Orthopaedic Injuries: - Left intertrochanteric proximal femur fracture s/p IMN             WBAT with assistance             ROM as tolerated left hip and knee             Dressing changes as needed              Ice as needed             Therapies   - Left proximal humerus fracture             Continue nonoperative management             Sling for comfort                       Ok to be out of sling in chair or bed if pt wants              Pendulums in sling is okay             Gentle shoulder flexion and extension             Unrestricted range of motion of elbow, forearm wrist and hand             Ice as needed  Bone health: Labs show vitamin D  deficiency.  Please take vitamin D  supplements such as 5000 IUs of vitamin D3 daily  Review the following resource for additional information regarding bone health  [email or web address].cy  Wound Care: Daily wound care as needed to left thigh starting now  Discharge Wound Care Instructions  Do NOT apply any ointments, solutions or lotions to pin sites or surgical wounds.  These prevent needed drainage and even though solutions like hydrogen peroxide kill bacteria, they also damage cells lining the pin sites that help fight infection.  Applying lotions or ointments can keep the wounds moist and can cause them to breakdown and open up as well. This can increase the risk for infection. When in doubt call the office.  Surgical incisions should be dressed daily.  If any drainage is noted, use one layer of adaptic or Mepitel, then gauze and tape.  Alternatively can use a silicone foam dressing such as a  Mepilex  [email or web address][employer]cz [email or web address]://dennis-soto.com/?pd_rd_i=[email or web address]&th=1  [email or web address].com/  These dressing supplies should be available at local medical supply stores ([facility] [employer], [facility] [employer], etc). They are not usually carried at places like [facility], [facility], [facility], etc  Once the incision is completely dry and without drainage, it may be left open to air out.  Showering may begin 36-48 hours later.  Cleaning gently with soap and water.    DVT/PE prophylaxis: Home Eliquis   Diet: as you were eating previously.  Can use over the counter stool softeners and bowel preparations, such as Miralax , to help with bowel movements.  Narcotics can be constipating.  Be sure to drink plenty of fluids  PAIN MEDICATION USE AND EXPECTATIONS  You have likely been given narcotic medications to help control your pain.  After a traumatic event that results in an fracture (broken bone) with or without surgery, it is ok to use narcotic pain medications to help control one's pain.  We understand that everyone responds to pain differently and each individual patient will be evaluated on a regular basis for the continued need for narcotic medications. Ideally, narcotic medication  use should last no more than 6-8 weeks (coinciding with fracture healing).   As a patient it is your responsibility as well to monitor narcotic medication use and report the amount and frequency you use these medications when you come to your office visit.   We would also advise that if you are using narcotic medications, you should take a dose prior to therapy to maximize you participation.  IF YOU ARE ON NARCOTIC MEDICATIONS IT IS NOT PERMISSIBLE TO OPERATE A MOTOR VEHICLE (MOTORCYCLE/CAR/TRUCK/MOPED) OR HEAVY MACHINERY DO NOT MIX NARCOTICS  WITH OTHER CNS (CENTRAL NERVOUS SYSTEM) DEPRESSANTS SUCH AS ALCOHOL   POST-OPERATIVE OPIOID TAPER INSTRUCTIONS: It is important to wean off of your opioid medication as soon as possible. If you do not need pain medication after your surgery it is ok to stop day one. Opioids include: Codeine, Hydrocodone (Norco, Vicodin), Oxycodone (Percocet, oxycontin ) and hydromorphone  amongst others.  Long term and even short term use of opiods can cause: Increased pain response Dependence Constipation Depression Respiratory depression And more.  Withdrawal symptoms can include Flu like symptoms Nausea, vomiting And more Techniques to manage these symptoms Hydrate well Eat regular healthy meals Stay active Use relaxation techniques(deep breathing, meditating, yoga) Do Not substitute Alcohol to help with tapering If you have been on opioids for less than two weeks and do not have pain than it is ok to stop all together.  Plan to wean off of opioids This plan should start within one week post op of your fracture surgery  Maintain the same interval or time between taking each dose and first decrease the dose.  Cut the total daily intake of opioids by one tablet each day Next start to increase the time between doses. The last dose that should be eliminated is the evening dose.    STOP SMOKING OR USING NICOTINE PRODUCTS!!!!  As discussed nicotine severely impairs your body's ability to heal surgical and traumatic wounds but also impairs bone healing.  Wounds and bone heal by forming microscopic blood vessels (angiogenesis) and nicotine is a vasoconstrictor (essentially, shrinks blood vessels).  Therefore, if vasoconstriction occurs to these microscopic blood vessels they essentially disappear and are unable to deliver necessary nutrients to the healing tissue.  This is one modifiable factor that you can do to dramatically increase your chances of healing your injury.    (This means no smoking, no  nicotine gum, patches, etc)  DO NOT USE NONSTEROIDAL ANTI-INFLAMMATORY DRUGS (NSAID'S)  Using products such as Advil (ibuprofen), Aleve (naproxen), Motrin (ibuprofen) for additional pain control during fracture healing can delay and/or prevent the healing response.  If you would like to take over the counter (OTC) medication, Tylenol  (acetaminophen ) is ok.  However, some narcotic medications that are given for pain control contain acetaminophen  as well. Therefore, you should not exceed more than 4000 mg of tylenol  in a day if you do not have liver disease.  Also note that there are may OTC medicines, such as cold medicines and allergy medicines that my contain tylenol  as well.  If you have any questions about medications and/or interactions please ask your doctor/PA or your pharmacist.      ICE AND ELEVATE INJURED/OPERATIVE EXTREMITY  Using ice and elevating the injured extremity above your heart can help with swelling and pain control.  Icing in a pulsatile fashion, such as 20 minutes on and 20 minutes off, can be followed.    Do not place ice directly on skin. Make sure there is a barrier between to skin  and the ice pack.    Using frozen items such as frozen peas works well as the conform nicely to the are that needs to be iced.  USE AN ACE WRAP OR TED HOSE FOR SWELLING CONTROL  In addition to icing and elevation, Ace wraps or TED hose are used to help limit and resolve swelling.  It is recommended to use Ace wraps or TED hose until you are informed to stop.    When using Ace Wraps start the wrapping distally (farthest away from the body) and wrap proximally (closer to the body)   Example: If you had surgery on your leg and you do not have a splint on, start the ace wrap at the toes and work your way up to the thigh        If you had surgery on your upper extremity and do not have a splint on, start the ace wrap at your fingers and work your way up to the upper arm  IF YOU ARE IN A SPLINT OR CAST  DO NOT REMOVE IT FOR ANY REASON   If your splint gets wet for any reason please contact the office immediately. You may shower in your splint or cast as long as you keep it dry.  This can be done by wrapping in a cast cover or garbage back (or similar)  Do Not stick any thing down your splint or cast such as pencils, money, or hangers to try and scratch yourself with.  If you feel itchy take benadryl as prescribed on the bottle for itching  IF YOU ARE IN A CAM BOOT (BLACK BOOT)  You may remove boot periodically. Perform daily dressing changes as noted below.  Wash the liner of the boot regularly and wear a sock when wearing the boot. It is recommended that you sleep in the boot until told otherwise    Call office for the following: Temperature greater than 101F Persistent nausea and vomiting Severe uncontrolled pain Redness, tenderness, or signs of infection (pain, swelling, redness, odor or green/yellow discharge around the site) Difficulty breathing, headache or visual disturbances Hives Persistent dizziness or light-headedness Extreme fatigue Any other questions or concerns you may have after discharge  In an emergency, call 911 or go to an Emergency Department at a nearby hospital  HELPFUL INFORMATION  If you had a block, it will wear off between 8-24 hrs postop typically.  This is period when your pain may go from nearly zero to the pain you would have had postop without the block.  This is an abrupt transition but nothing dangerous is happening.  You may take an extra dose of narcotic when this happens.  You should wean off your narcotic medicines as soon as you are able.  Most patients will be off or using minimal narcotics before their first postop appointment.   We suggest you use the pain medication the first night prior to going to bed, in order to ease any pain when the anesthesia wears off. You should avoid taking pain medications on an empty stomach as it will make you  nauseous.  Do not drink alcoholic beverages or take illicit drugs when taking pain medications.  In most states it is against the law to drive while you are in a splint or sling.  And certainly against the law to drive while taking narcotics.  You may return to work/school in the next couple of days when you feel up to it.   Pain medication may  make you constipated.  Below are a few solutions to try in this order: Decrease the amount of pain medication if you aren't having pain. Drink lots of decaffeinated fluids. Drink prune juice and/or each dried prunes  If the first 3 don't work start with additional solutions Take Colace - an over-the-counter stool softener Take Senokot - an over-the-counter laxative Take Miralax  - a stronger over-the-counter laxative     CALL THE OFFICE WITH ANY QUESTIONS OR CONCERNS: [phone]   VISIT OUR WEBSITE FOR ADDITIONAL INFORMATION: [email or web address]

## 2024-05-26 NOTE — Progress Notes (Signed)
 Orthopaedic Trauma Service Progress Note  Patient ID: Rachel Hines MRN: [id number] DOB/AGE: Jul 03, 1931 89 y.o.  Subjective:  Doing fair Needs to get out of bed    ROS As above  Today's  total administered Morphine  Milligram Equivalents: 0 Yesterday's total administered Morphine  Milligram Equivalents: 7.5  Objective:   VITALS:   Vitals:   05/25/24 1619 05/25/24 1926 05/26/24 0514 05/26/24 0804  BP: (!) 83/61 (!) 97/45 103/62 (!) 103/58  Pulse: 85 87 90 98  Resp: 16  17 16   Temp: 98 F (36.7 C) 99.1 F (37.3 C) 98.2 F (36.8 C) 98.6 F (37 C)  TempSrc: Oral Oral Oral Oral  SpO2: 96% 98% 96% 98%  Weight:      Height:        Estimated body mass index is 26.57 kg/m as calculated from the following:   Height as of this encounter: 5' 3 (1.6 m).   Weight as of this encounter: 68 kg.   Intake/Output      09/10 0701 09/11 0700 09/11 0701 09/12 0700   P.O. 300 240   I.V. (mL/kg) 11.1 (0.2)    Blood     Total Intake(mL/kg) 311.1 (4.6) 240 (3.5)   Urine (mL/kg/hr) 950 (0.6)    Total Output 950    Net -638.9 +240        Urine Occurrence 1 x      LABS  Results for orders placed or performed during the hospital encounter of 05/22/24 (from the past 24 hours)  Glucose, capillary     Status: Abnormal   Collection Time: 05/25/24 11:03 AM  Result Value Ref Range   Glucose-Capillary 135 (H) 70 - 99 mg/dL  Glucose, capillary     Status: Abnormal   Collection Time: 05/25/24  4:24 PM  Result Value Ref Range   Glucose-Capillary 135 (H) 70 - 99 mg/dL  Glucose, capillary     Status: Abnormal   Collection Time: 05/25/24  9:41 PM  Result Value Ref Range   Glucose-Capillary 113 (H) 70 - 99 mg/dL  CBC     Status: Abnormal   Collection Time: 05/26/24  5:22 AM  Result Value Ref Range   WBC 11.9 (H) 4.0 - 10.5 K/uL   RBC 2.44 (L) 3.87 - 5.11 MIL/uL   Hemoglobin 7.2 (L) 12.0 - 15.0 g/dL   HCT 77.7  (L) 63.9 - 46.0 %   MCV 91.0 80.0 - 100.0 fL   MCH 29.5 26.0 - 34.0 pg   MCHC 32.4 30.0 - 36.0 g/dL   RDW 86.2 88.4 - 84.4 %   Platelets 169 150 - 400 K/uL   nRBC 0.3 (H) 0.0 - 0.2 %  Comprehensive metabolic panel     Status: Abnormal   Collection Time: 05/26/24  5:22 AM  Result Value Ref Range   Sodium 137 135 - 145 mmol/L   Potassium 4.5 3.5 - 5.1 mmol/L   Chloride 104 98 - 111 mmol/L   CO2 24 22 - 32 mmol/L   Glucose, Bld 118 (H) 70 - 99 mg/dL   BUN 42 (H) 8 - 23 mg/dL   Creatinine, Ser 8.77 (H) 0.44 - 1.00 mg/dL   Calcium  8.0 (L) 8.9 - 10.3 mg/dL   Total Protein 5.2 (L) 6.5 - 8.1 g/dL   Albumin 2.6 (L) 3.5 -  5.0 g/dL   AST 20 15 - 41 U/L   ALT 7 0 - 44 U/L   Alkaline Phosphatase 57 38 - 126 U/L   Total Bilirubin 0.9 0.0 - 1.2 mg/dL   GFR, Estimated 42 (L) >60 mL/min   Anion gap 9 5 - 15  Glucose, capillary     Status: Abnormal   Collection Time: 05/26/24  6:16 AM  Result Value Ref Range   Glucose-Capillary 114 (H) 70 - 99 mg/dL     PHYSICAL EXAM:   Gen: resting comfortably in bed, looks good, NAD  Lungs: unlabored Ext:       Left upper extremity              Motor and sensory functions intact             No change in exam   Ecchymosis stable                   Left Lower Extremity Dressing is clean, dry and intact             Extremity is warm             No DCT             Compartments are soft             No pain out of proportion with passive stretching of toes or ankle             DPN, SPN, TN sensory functions are intact             EHL, FHL, lesser toe motor functions intact             Ankle flexion, extension, inversion eversion intact             + DP pulse  Assessment/Plan: 3 Days Post-Op   Principal Problem:   Intertrochanteric fracture of left femur, closed, initial encounter [facility]) Active Problems:   Fall   Closed 2-part displaced fracture of surgical neck of left humerus   Type 2 diabetes mellitus with hyperglycemia, without long-term  current use of insulin  (HCC)   Atrial fibrillation, chronic (HCC)   Anti-infectives (From admission, onward)    Start     Dose/Rate Route Frequency Ordered Stop   05/24/24 0830  ceFAZolin  (ANCEF ) IVPB 2g/100 mL premix        2 g 200 mL/hr over 30 Minutes Intravenous Every 12 hours 05/24/24 0801 05/24/24 2131   05/23/24 1200  ceFAZolin  (ANCEF ) IVPB 2g/100 mL premix  Status:  Discontinued        2 g 200 mL/hr over 30 Minutes Intravenous On call to O.R. 05/22/24 2016 05/24/24 0559     .  POD/HD#: 49  89 year old female status post fall with left intertrochanteric proximal femur fracture and left proximal humerus fracture   - Fall   - Left intertrochanteric proximal femur fracture s/p IMN             WBAT with assistance             ROM as tolerated left hip and knee             Dressing changes as needed              Ice as needed             Therapies   - Left proximal humerus fracture  Continue nonoperative management             Sling for comfort            Ok to be out of sling in chair or bed if pt wants              Pendulums in sling is okay             Gentle shoulder flexion and extension             Unrestricted range of motion of elbow, forearm wrist and hand             Ice as needed   - Pain management:             Multimodal, minimize narcotics   - ABL anemia/Hemodynamics             Cbc looks stable this am   - Medical issues              Per primary  - DVT/PE prophylaxis:             Eliquis  on hold  Defer restart to medicine    - ID:              Perioperative antibiotics  - Metabolic Bone Disease:             Fracture is a fragility fracture and indicative of osteoporosis             Labs show vitamin D  deficiency                         Supplement   - Activity:             As above   - Impediments to fracture healing:             Vitamin D  deficiency             Fragility fracture             Diabetes   - Dispo:              Ortho issues are stable             Therapy evaluations             TOC consult  Follow up with ortho in 2 weeks   Francis MICAEL Mt, PA-C [phone] (C) 05/26/2024, 9:32 AM  [facility] [address] [place] [address] [phone] [id number] [phone] (F)    After 5pm and on the weekends please log on to Amion, go to orthopaedics and the look under the [facility] Call for the provider(s) on call. You can also call our office at [phone] and then follow the prompts to be connected to the call team.  Patient ID: Rachel Hines, female   DOB: 07-04-1931, 89 y.o.   MRN: [id number]

## 2024-05-26 NOTE — Plan of Care (Signed)
  Problem: Education: Goal: Knowledge of General Education information will improve Description: Including pain rating scale, medication(s)/side effects and non-pharmacologic comfort measures Outcome: Progressing   Problem: Health Behavior/Discharge Planning: Goal: Ability to manage health-related needs will improve Outcome: Progressing   Problem: Clinical Measurements: Goal: Ability to maintain clinical measurements within normal limits will improve Outcome: Progressing Goal: Will remain free from infection Outcome: Progressing Goal: Diagnostic test results will improve Outcome: Progressing Goal: Respiratory complications will improve Outcome: Progressing Goal: Cardiovascular complication will be avoided Outcome: Progressing   Problem: Activity: Goal: Risk for activity intolerance will decrease Outcome: Progressing   Problem: Nutrition: Goal: Adequate nutrition will be maintained Outcome: Progressing   Problem: Coping: Goal: Level of anxiety will decrease Outcome: Progressing   Problem: Elimination: Goal: Will not experience complications related to bowel motility Outcome: Progressing Goal: Will not experience complications related to urinary retention Outcome: Progressing   Problem: Pain Managment: Goal: General experience of comfort will improve and/or be controlled Outcome: Progressing   Problem: Safety: Goal: Ability to remain free from injury will improve Outcome: Progressing   Problem: Skin Integrity: Goal: Risk for impaired skin integrity will decrease Outcome: Progressing   Problem: Education: Goal: Ability to describe self-care measures that may prevent or decrease complications (Diabetes Survival Skills Education) will improve Outcome: Progressing Goal: Individualized Educational Video(s) Outcome: Progressing   Problem: Coping: Goal: Ability to adjust to condition or change in health will improve Outcome: Progressing   Problem: Fluid  Volume: Goal: Ability to maintain a balanced intake and output will improve Outcome: Progressing   Problem: Health Behavior/Discharge Planning: Goal: Ability to identify and utilize available resources and services will improve Outcome: Progressing Goal: Ability to manage health-related needs will improve Outcome: Progressing   Problem: Metabolic: Goal: Ability to maintain appropriate glucose levels will improve Outcome: Progressing   Problem: Nutritional: Goal: Maintenance of adequate nutrition will improve Outcome: Progressing Goal: Progress toward achieving an optimal weight will improve Outcome: Progressing   Problem: Skin Integrity: Goal: Risk for impaired skin integrity will decrease Outcome: Progressing   Problem: Tissue Perfusion: Goal: Adequacy of tissue perfusion will improve Outcome: Progressing   Problem: Education: Goal: Verbalization of understanding the information provided (i.e., activity precautions, restrictions, etc) will improve Outcome: Progressing Goal: Individualized Educational Video(s) Outcome: Progressing   Problem: Activity: Goal: Ability to ambulate and perform ADLs will improve Outcome: Progressing   Problem: Clinical Measurements: Goal: Postoperative complications will be avoided or minimized Outcome: Progressing   Problem: Self-Concept: Goal: Ability to maintain and perform role responsibilities to the fullest extent possible will improve Outcome: Progressing   Problem: Pain Management: Goal: Pain level will decrease Outcome: Progressing

## 2024-05-26 NOTE — Progress Notes (Signed)
 Physical Therapy Treatment Patient Details Name: Rachel Hines MRN: [id number] DOB: 1931/06/19 Today's Date: 05/26/2024   History of Present Illness ARON NEEDLES is a 89 y.o. female admitted 05/22/24 following fall sustaining left intertrochanteric proximal femur fracture and left proximal humerus fracture. Pt s/p IM nail of left hip 9/8. Continue non-op treatment of left shoulder. PMHx: A-fib on Eliquis , CVA, T2DM, HTN, HLD, OA, osteoporosis, and CKD 3A.    PT Comments  Pt advanced OOB mobility performing bed>chair transfer. She required modA x2 using RW. Cues for sequencing and assist to maneuver AD. Pt took short small steps to pivot to recliner chair. Assist to manage LUE on/off platform attachment. Pt continues to be limited by symptomatic orthostatic hypotension. She had a 10 mmHg drop in DBP from supine to sitting and a 60 mmHg drop in SBP and 16 mmHg drop in DBP from sitting to standing. She recovered to 105/74 (86) seated in recliner chair with legs elevated. MD and RN notified of pt's VS response to activity. Pt may benefit from Ted hose and/or an abdominal binder. Will continue to follow acutely and advance appropriately.    05/26/24 1300  Vital Signs  Patient Position (if appropriate) Orthostatic Vitals  Orthostatic Lying   BP- Lying 109/66  Orthostatic Sitting  BP- Sitting 111/56 (pt c/o light dizziness)  Orthostatic Standing at 0 minutes  BP- Standing at 0 minutes (!) 51/40 (pt c/o swimmy headed feeling)     If plan is discharge home, recommend the following: Two people to help with walking and/or transfers;Two people to help with bathing/dressing/bathroom;Assistance with cooking/housework;Assist for transportation;Help with stairs or ramp for entrance   Can travel by private vehicle     No  Equipment Recommendations  Hospital bed;Hoyer lift;Wheelchair (measurements PT);Wheelchair cushion (measurements PT);Rolling walker (2 wheels)    Recommendations for Other Services        Precautions / Restrictions Precautions Precautions: Fall Recall of Precautions/Restrictions: Intact Precaution/Restrictions Comments: Vision Impairments (blind in R eye, fuzzy in L eye); Watch BP Required Braces or Orthoses: Sling (for comfort) Restrictions Weight Bearing Restrictions Per Provider Order: Yes LUE Weight Bearing Per Provider Order: Weight bear through elbow only LLE Weight Bearing Per Provider Order: Weight bearing as tolerated     Mobility  Bed Mobility Overal bed mobility: Needs Assistance Bed Mobility: Supine to Sit     Supine to sit: Mod assist, HOB elevated, Used rails, +2 for physical assistance     General bed mobility comments: Pt sat up on R side of bed with increased time. She slowly brought RLE off EOB. Assist to manage LLE. Pivoted pt and elevated trunk with use of bed pad. Pt protected LUE by holding it close to her chest.    Transfers Overall transfer level: Needs assistance Equipment used: Left platform walker Transfers: Sit to/from Stand, Bed to chair/wheelchair/BSC Sit to Stand: Min assist, +2 physical assistance   Step pivot transfers: Mod assist, +2 physical assistance       General transfer comment: Pt stood from lowest bed height. She pushed up with RUE support from bed. PT assisted in bring LUE onto platform attachment. Powered up with minA x2. Transferred to recliner chair on left. Fair eccentric control. Assist to bring LUE off platform attachment. Cues to reach back for arm rest.    Ambulation/Gait Ambulation/Gait assistance: Mod assist, +2 physical assistance Gait Distance (Feet): 2 Feet Assistive device: Left platform walker Gait Pattern/deviations: Step-to pattern, Decreased step length - right, Decreased step length - left,  Decreased stride length Gait velocity: decreased     General Gait Details: Pt took short slow steps to pivot to recliner chair. Cues for sequencing. Assist to manage RW during turn.   Stairs              Wheelchair Mobility     Tilt Bed    Modified Rankin (Stroke Patients Only)       Balance Overall balance assessment: Needs assistance Sitting-balance support: Single extremity supported, Feet supported Sitting balance-Leahy Scale: Fair Sitting balance - Comments: Pt sat EOB with supervision. She required assist to scoot fwd/bkwd using bed pad.   Standing balance support: Bilateral upper extremity supported, During functional activity, Reliant on assistive device for balance Standing balance-Leahy Scale: Poor Standing balance comment: Pt dependent on platform walker and +2 assist.                            Communication Communication Communication: Impaired Factors Affecting Communication: Hearing impaired  Cognition Arousal: Alert Behavior During Therapy: WFL for tasks assessed/performed   PT - Cognitive impairments: Sequencing, Problem solving, Safety/Judgement                         Following commands: Impaired Following commands impaired: Follows one step commands with increased time, Follows multi-step commands inconsistently, Follows multi-step commands with increased time    Cueing Cueing Techniques: Verbal cues, Visual cues, Tactile cues  Exercises      General Comments General comments (skin integrity, edema, etc.): Pt with (+) OH. She c/o feeling slightly dizzy in sitting and swimmy headed in standing. BP: supine 109/66, seated 111/56, stand 51/40, post-transfer in recliner chair with feet elevated 105/74 (86).      Pertinent Vitals/Pain Pain Assessment Pain Assessment: Faces Faces Pain Scale: Hurts little more Pain Location: L shoulder and L hip Pain Descriptors / Indicators: Operative site guarding, Grimacing, Discomfort, Aching, Sore Pain Intervention(s): Monitored during session, Limited activity within patient's tolerance, Repositioned    Home Living                          Prior Function             PT Goals (current goals can now be found in the care plan section) Acute Rehab PT Goals Patient Stated Goal: Continue to progress each day to regain my independence. Progress towards PT goals: Progressing toward goals    Frequency    Min 2X/week      PT Plan      Co-evaluation              AM-PAC PT 6 Clicks Mobility   Outcome Measure  Help needed turning from your back to your side while in a flat bed without using bedrails?: Total Help needed moving from lying on your back to sitting on the side of a flat bed without using bedrails?: Total Help needed moving to and from a bed to a chair (including a wheelchair)?: Total Help needed standing up from a chair using your arms (e.g., wheelchair or bedside chair)?: Total Help needed to walk in hospital room?: Total Help needed climbing 3-5 steps with a railing? : Total 6 Click Score: 6    End of Session Equipment Utilized During Treatment: Gait belt Activity Tolerance: Treatment limited secondary to medical complications (Comment) (symptomatic orthostatic hypotension) Patient left: in chair;with call bell/phone within reach;with chair alarm set;with  family/visitor present Nurse Communication: Mobility status;Other (comment) (VS response, (+) OH; MD/RN notified of rec for ted hose and abdominal binder) PT Visit Diagnosis: Difficulty in walking, not elsewhere classified (R26.2);Dizziness and giddiness (R42);Other abnormalities of gait and mobility (R26.89);Unsteadiness on feet (R26.81)     Time: 8642-8576 PT Time Calculation (min) (ACUTE ONLY): 26 min  Charges:    $Therapeutic Activity: 23-37 mins PT General Charges $$ ACUTE PT VISIT: 1 Visit                     Randall SAUNDERS, PT, DPT Acute Rehabilitation Services Office: [phone] Secure Chat Preferred  Delon CHRISTELLA Callander 05/26/2024, 3:54 PM

## 2024-05-26 NOTE — Progress Notes (Signed)
 [facility]  PROGRESS NOTE  MAITLAND MUHLBAUER FMW:[id number] DOB: 04-Mar-1931 DOA: 05/22/2024 PCP: Claudene Pellet, MD   Brief HPI:    89 y.o. female with a PMH significant for A-fib on Eliquis , CVA, T2DM, HTN, HLD, arthritis and CKD 3A who presents to the ED for evaluation of left hip and shoulder pain after a fall.  ED Course: Initial vitals show patient afebrile and normotensive. Initial labs significant for glucose 171, creatinine 0.91, WBC 12.3, Hgb 9.1. EKG shows A-fib with slightly prolonged QTc of 490. CXR shows no acute cardiopulmonary disease.  X-ray of the left shoulder shows acute mildly impacted fracture of the proximal left humerus and old left clavicle fracture.  X-ray of the pelvis shows acute, comminuted fracture of the proximal left femur. CT head/ cervical spine negative acute. Pt received IV Zofran , IV morphine  and IV Dilaudid .  Orthopedic surgery was consulted for evaluation.    05/24/24 -humerus fracture treated conservatively. Ortho performed hip fixation 9/8.  Requiring blood transfusion today. Significant hematoma of left humerus fracture    Assessment/Plan:   Mechanical fall # Left proximal hip fracture # Left proximal humerus fracture - Patient presented with left hip and humerus pain after tripping and and falling on her left side at home - X-ray of the left shoulder shows acute mildly impacted fracture of the proximal left humerus and X-ray of the pelvis shows acute, comminuted fracture of the proximal left femur. - Orthopedic surgery consulted, plan for IM nailing of the left hip and nonoperative treatment of the left humerus - Continue left shoulder immobilizer - Pain control with scheduled Tylenol  and as needed IV Dilaudid  - Fall precautions - PT, OT   Acute blood loss anemia Hematoma of left bicep- see clinical image for further detail - hgb dropped to 6.5 on 05/24/2024 -Status post 1 unit PRBC, hemoglobin improved to 7.2 today -Follow CBC in a.m.  #  T2DM with hyperglycemia - A1c 5.8 - sSSI   # A-fib- mildly elevated HR. Decreased home atenolol  due to hypotension but will hopefully be able to increase dose back up to home after transfusion.  - restart eliquis  when hgb stabilizes  - Telemetry  # Vitamin D  deficiency -Vitamin D  level 8.69 -Continue vitamin D  50,000 units oral q. 7 days   # Orthostatic hypotension - SBP dropped to 60s on standing - decreased atenolol  and hold olmesartan  (Irbesartan  as substitute) -Will continue to monitor hold atenolol  - Start TED hose and abdominal binder  Acute kidney injury -Likely in setting of hypotension -Renal function slowly improving -Was started on LR at 75 mL/h yesterday - Check BMP in a.m.   # HLD - Continue rosuvastatin    # Insomnia - Continue Ambien  at bedtime   Body mass index is 26.57 kg/m.    Medications     sodium chloride    Intravenous Once   sodium chloride    Intravenous Once   acetaminophen   1,000 mg Oral TID   cholecalciferol   2,000 Units Oral BID   feeding supplement (GLUCERNA SHAKE)  237 mL Oral TID BM   insulin  aspart  0-9 Units Subcutaneous TID WC   polyethylene glycol  17 g Oral BID   rosuvastatin   5 mg Oral Daily   Vitamin D  (Ergocalciferol )  50,000 Units Oral Q7 days   zolpidem   5 mg Oral QHS     Data Reviewed:   CBG:  Recent Labs  Lab 05/25/24 0605 05/25/24 1103 05/25/24 1624 05/25/24 2141 05/26/24 0616  GLUCAP 128* 135* 135* 113*  114*    SpO2: 98 % O2 Flow Rate (L/min): 2 L/min    Vitals:   05/25/24 1619 05/25/24 1926 05/26/24 0514 05/26/24 0804  BP: (!) 83/61 (!) 97/45 103/62 (!) 103/58  Pulse: 85 87 90 98  Resp: 16  17 16   Temp: 98 F (36.7 C) 99.1 F (37.3 C) 98.2 F (36.8 C) 98.6 F (37 C)  TempSrc: Oral Oral Oral Oral  SpO2: 96% 98% 96% 98%  Weight:      Height:          Data Reviewed:  Basic Metabolic Panel: Recent Labs  Lab 05/22/24 1648 05/23/24 0706 05/24/24 0434 05/26/24 0522  NA 139 140 137 137   K 4.2 5.1 4.7 4.5  CL 106 105 108 104  CO2 22 22 19* 24  GLUCOSE 171* 156* 192* 118*  BUN 23 31* 47* 42*  CREATININE 0.91 1.44* 1.94* 1.22*  CALCIUM  8.4* 8.2* 7.9* 8.0*    CBC: Recent Labs  Lab 05/22/24 1648 05/23/24 0706 05/24/24 0434 05/25/24 0442 05/26/24 0522  WBC 12.3* 12.3* 12.7* 14.1* 11.9*  NEUTROABS 10.6*  --   --   --   --   HGB 9.1* 8.3* 6.5* 7.4* 7.2*  HCT 29.7* 26.5* 20.1* 22.4* 22.2*  MCV 92.5 92.0 90.1 88.9 91.0  PLT 145* 176 122* 132* 169    LFT Recent Labs  Lab 05/22/24 1648 05/26/24 0522  AST 21 20  ALT 15 7  ALKPHOS 87 57  BILITOT 0.5 0.9  PROT 5.5* 5.2*  ALBUMIN 3.4* 2.6*     Antibiotics: Anti-infectives (From admission, onward)    Start     Dose/Rate Route Frequency Ordered Stop   05/24/24 0830  ceFAZolin  (ANCEF ) IVPB 2g/100 mL premix        2 g 200 mL/hr over 30 Minutes Intravenous Every 12 hours 05/24/24 0801 05/24/24 2131   05/23/24 1200  ceFAZolin  (ANCEF ) IVPB 2g/100 mL premix  Status:  Discontinued        2 g 200 mL/hr over 30 Minutes Intravenous On call to O.R. 05/22/24 2016 05/24/24 0559        DVT prophylaxis: SCDs  Code Status: Full code  Family Communication: Discussed with patient's family at bedside   CONSULTS    Subjective    Patient continues to be orthostatic on standing.  BP dropped to 60s  Objective    Physical Examination:   Appears in no acute distress S1-S2, regular, no murmur auscultated Lungs clear to auscultation bilaterally Alert, pleasantly confused   Status is: Inpatient:             [name]   [facility] If 7PM-7AM, please contact night-coverage at [email or web address], Office  [phone]   05/26/2024, 8:14 AM  LOS: 4 days

## 2024-05-27 DIAGNOSIS — S72142A Displaced intertrochanteric fracture of left femur, initial encounter for closed fracture: Secondary | ICD-10-CM | POA: Diagnosis not present

## 2024-05-27 DIAGNOSIS — E1165 Type 2 diabetes mellitus with hyperglycemia: Secondary | ICD-10-CM | POA: Diagnosis not present

## 2024-05-27 DIAGNOSIS — I482 Chronic atrial fibrillation, unspecified: Secondary | ICD-10-CM | POA: Diagnosis not present

## 2024-05-27 LAB — CBC
HCT: 21.8 % — ABNORMAL LOW (ref 36.0–46.0)
Hemoglobin: 6.9 g/dL — CL (ref 12.0–15.0)
MCH: 29 pg (ref 26.0–34.0)
MCHC: 31.7 g/dL (ref 30.0–36.0)
MCV: 91.6 fL (ref 80.0–100.0)
Platelets: 186 K/uL (ref 150–400)
RBC: 2.38 MIL/uL — ABNORMAL LOW (ref 3.87–5.11)
RDW: 13.8 % (ref 11.5–15.5)
WBC: 9.4 K/uL (ref 4.0–10.5)
nRBC: 0.5 % — ABNORMAL HIGH (ref 0.0–0.2)

## 2024-05-27 LAB — COMPREHENSIVE METABOLIC PANEL WITH GFR
ALT: 6 U/L (ref 0–44)
AST: 19 U/L (ref 15–41)
Albumin: 2.6 g/dL — ABNORMAL LOW (ref 3.5–5.0)
Alkaline Phosphatase: 56 U/L (ref 38–126)
Anion gap: 12 (ref 5–15)
BUN: 35 mg/dL — ABNORMAL HIGH (ref 8–23)
CO2: 25 mmol/L (ref 22–32)
Calcium: 8 mg/dL — ABNORMAL LOW (ref 8.9–10.3)
Chloride: 102 mmol/L (ref 98–111)
Creatinine, Ser: 1.04 mg/dL — ABNORMAL HIGH (ref 0.44–1.00)
GFR, Estimated: 51 mL/min — ABNORMAL LOW (ref >60)
Glucose, Bld: 138 mg/dL — ABNORMAL HIGH (ref 70–99)
Potassium: 4.7 mmol/L (ref 3.5–5.1)
Sodium: 139 mmol/L (ref 135–145)
Total Bilirubin: 1.2 mg/dL (ref 0.0–1.2)
Total Protein: 5.3 g/dL — ABNORMAL LOW (ref 6.5–8.1)

## 2024-05-27 LAB — HEMOGLOBIN AND HEMATOCRIT, BLOOD
HCT: 26.3 % — ABNORMAL LOW (ref 36.0–46.0)
Hemoglobin: 8.4 g/dL — ABNORMAL LOW (ref 12.0–15.0)

## 2024-05-27 LAB — GLUCOSE, CAPILLARY
Glucose-Capillary: 173 mg/dL — ABNORMAL HIGH (ref 70–99)
Glucose-Capillary: 141 mg/dL — ABNORMAL HIGH (ref 70–99)
Glucose-Capillary: 149 mg/dL — ABNORMAL HIGH (ref 70–99)
Glucose-Capillary: 152 mg/dL — ABNORMAL HIGH (ref 70–99)
Glucose-Capillary: 116 mg/dL — ABNORMAL HIGH (ref 70–99)

## 2024-05-27 LAB — PREPARE RBC (CROSSMATCH)

## 2024-05-27 MED ORDER — SODIUM CHLORIDE 0.9% IV SOLUTION: Freq: Once | INTRAVENOUS | Status: AC

## 2024-05-27 NOTE — Progress Notes (Signed)
 [facility]  PROGRESS NOTE  Rachel Hines FMW:[id number] DOB: [birth year] DOA: 05/22/2024 PCP: Claudene Pellet, MD   Brief HPI:    89 y.o. female with a PMH significant for A-fib on Eliquis , CVA, T2DM, HTN, HLD, arthritis and CKD 3A who presents to the ED for evaluation of left hip and shoulder pain after a fall.  ED Course: Initial vitals show patient afebrile and normotensive. Initial labs significant for glucose 171, creatinine 0.91, WBC 12.3, Hgb 9.1. EKG shows A-fib with slightly prolonged QTc of 490. CXR shows no acute cardiopulmonary disease.  X-ray of the left shoulder shows acute mildly impacted fracture of the proximal left humerus and old left clavicle fracture.  X-ray of the pelvis shows acute, comminuted fracture of the proximal left femur. CT head/ cervical spine negative acute. Pt received IV Zofran , IV morphine  and IV Dilaudid .  Orthopedic surgery was consulted for evaluation.    05/24/24 -humerus fracture treated conservatively. Ortho performed hip fixation 9/8.  Requiring blood transfusion today. Significant hematoma of left humerus fracture    Assessment/Plan:   Mechanical fall # Left proximal hip fracture # Left proximal humerus fracture - Patient presented with left hip and humerus pain after tripping and and falling on her left side at home - X-ray of the left shoulder shows acute mildly impacted fracture of the proximal left humerus and X-ray of the pelvis shows acute, comminuted fracture of the proximal left femur. - Orthopedic surgery consulted, plan for IM nailing of the left hip and nonoperative treatment of the left humerus - Continue left shoulder immobilizer - Pain control with scheduled Tylenol  and as needed IV Dilaudid  - Fall precautions - PT, OT   Acute blood loss anemia Hematoma of left bicep- see clinical image for further detail -Hemoglobin dropped to 6.9 today -Earlier had dropped to 6.5 and required 1 unit PRBC -Will give another unit of PRBC  today, follow CBC in a.m.   # T2DM with hyperglycemia - A1c 5.8 - sSSI   # A-fib- mildly elevated HR. Decreased home atenolol  due to hypotension but will hopefully be able to increase dose back up to home after transfusion.  - restart eliquis  when hgb stabilizes  - Telemetry  # Vitamin D  deficiency -Vitamin D  level 8.69 -Continue vitamin D  50,000 units oral q. 7 days   # Orthostatic hypotension - SBP dropped to 60s on standing - decreased atenolol  and hold olmesartan  (Irbesartan  as substitute) -Will continue to monitor hold atenolol  - Start TED hose and abdominal binder  Acute kidney injury -Likely in setting of hypotension -Renal function slowly improving -   # HLD - Continue rosuvastatin    # Insomnia - Continue Ambien  at bedtime   Body mass index is 26.57 kg/m.    Medications     sodium chloride    Intravenous Once   sodium chloride    Intravenous Once   acetaminophen   1,000 mg Oral TID   cholecalciferol   2,000 Units Oral BID   feeding supplement (GLUCERNA SHAKE)  237 mL Oral TID BM   insulin  aspart  0-9 Units Subcutaneous TID WC   polyethylene glycol  17 g Oral BID   rosuvastatin   5 mg Oral Daily   Vitamin D  (Ergocalciferol )  50,000 Units Oral Q7 days   zolpidem   5 mg Oral QHS     Data Reviewed:   CBG:  Recent Labs  Lab 05/26/24 1132 05/26/24 1720 05/26/24 2106 05/27/24 0625 05/27/24 1133  GLUCAP 158* 218* 113* 173* 141*    SpO2: 97 %  O2 Flow Rate (L/min): 2 L/min    Vitals:   05/27/24 0810 05/27/24 1110 05/27/24 1128 05/27/24 1405  BP: 112/62 (!) 98/50 (!) 104/50 (!) 104/47  Pulse: (!) 110 82 100 66  Resp: 16 19    Temp: 98 F (36.7 C) 98.6 F (37 C) 98.1 F (36.7 C) 97.7 F (36.5 C)  TempSrc: Oral Oral Oral Oral  SpO2: 97% 97% 97% 97%  Weight:      Height:          Data Reviewed:  Basic Metabolic Panel: Recent Labs  Lab 05/22/24 1648 05/23/24 0706 05/24/24 0434 05/26/24 0522 05/27/24 0523  NA 139 140 137 137 139  K  4.2 5.1 4.7 4.5 4.7  CL 106 105 108 104 102  CO2 22 22 19* 24 25  GLUCOSE 171* 156* 192* 118* 138*  BUN 23 31* 47* 42* 35*  CREATININE 0.91 1.44* 1.94* 1.22* 1.04*  CALCIUM  8.4* 8.2* 7.9* 8.0* 8.0*    CBC: Recent Labs  Lab 05/22/24 1648 05/23/24 0706 05/24/24 0434 05/25/24 0442 05/26/24 0522 05/27/24 0523 05/27/24 1539  WBC 12.3* 12.3* 12.7* 14.1* 11.9* 9.4  --   NEUTROABS 10.6*  --   --   --   --   --   --   HGB 9.1* 8.3* 6.5* 7.4* 7.2* 6.9* 8.4*  HCT 29.7* 26.5* 20.1* 22.4* 22.2* 21.8* 26.3*  MCV 92.5 92.0 90.1 88.9 91.0 91.6  --   PLT 145* 176 122* 132* 169 186  --     LFT Recent Labs  Lab 05/22/24 1648 05/26/24 0522 05/27/24 0523  AST 21 20 19   ALT 15 7 6   ALKPHOS 87 57 56  BILITOT 0.5 0.9 1.2  PROT 5.5* 5.2* 5.3*  ALBUMIN 3.4* 2.6* 2.6*     Antibiotics: Anti-infectives (From admission, onward)    Start     Dose/Rate Route Frequency Ordered Stop   05/24/24 0830  ceFAZolin  (ANCEF ) IVPB 2g/100 mL premix        2 g 200 mL/hr over 30 Minutes Intravenous Every 12 hours 05/24/24 0801 05/24/24 2131   05/23/24 1200  ceFAZolin  (ANCEF ) IVPB 2g/100 mL premix  Status:  Discontinued        2 g 200 mL/hr over 30 Minutes Intravenous On call to O.R. 05/22/24 2016 05/24/24 0559        DVT prophylaxis: SCDs  Code Status: Full code  Family Communication: Discussed with patient's family at bedside   CONSULTS    Subjective   Hemoglobin dropped to 6.9 this morning   Objective    Physical Examination:   Appears in no acute distress S1-S2, regular Lungs clear to auscultation bilaterally Abdomen is soft, nontender, no organomegaly  Status is: Inpatient:             [name]   [facility] If 7PM-7AM, please contact night-coverage at [email or web address], Office  [phone]   05/27/2024, 4:30 PM  LOS: 5 days

## 2024-05-27 NOTE — Plan of Care (Signed)
   Problem: Education: Goal: Knowledge of General Education information will improve Description: Including pain rating scale, medication(s)/side effects and non-pharmacologic comfort measures Outcome: Progressing   Problem: Nutrition: Goal: Adequate nutrition will be maintained Outcome: Progressing   Problem: Coping: Goal: Level of anxiety will decrease Outcome: Progressing

## 2024-05-27 NOTE — TOC Progression Note (Addendum)
 Transition of Care [facility]) - Progression Note    Patient Details  Name: Rachel Hines MRN: [id number] Date of Birth: [birth year]  [facility]) CM/SW Contact  Bridget Cordella Simmonds, LCSW Phone Number: 05/27/2024, 2:30 PM  Clinical Narrative:   Per MD, not stable for DC today.  CSW informed [facility].  They can receive pt over the weekend if she is ready.  Weekend contact is [facility], [employer] fundraiser, [phone]. Please fax DC summary to [phone].     Expected Discharge Plan: Skilled Nursing Facility Barriers to Discharge: Continued Medical Work up, SNF Pending bed offer               Expected Discharge Plan and Services In-house Referral: Clinical Social Work   Post Acute Care Choice: Skilled Nursing Facility Living arrangements for the past 2 months: Single Family Home                                       Social Drivers of Health (SDOH) Interventions SDOH Screenings   Food Insecurity: No Food Insecurity (05/22/2024)  Housing: Low Risk  (05/22/2024)  Transportation Needs: No Transportation Needs (05/22/2024)  Utilities: Not At Risk (05/22/2024)  Social Connections: Patient Declined (05/22/2024)  Tobacco Use: Low Risk  (05/23/2024)    Readmission Risk Interventions     No data to display

## 2024-05-27 NOTE — Plan of Care (Signed)
  Problem: Education: Goal: Knowledge of General Education information will improve Description: Including pain rating scale, medication(s)/side effects and non-pharmacologic comfort measures 05/27/2024 1906 by Rashunda Passon M, RN Outcome: Progressing 05/27/2024 1905 by Evgenia Merriman M, RN Outcome: Progressing   Problem: Nutrition: Goal: Adequate nutrition will be maintained 05/27/2024 1906 by Jerlisa Diliberto M, RN Outcome: Progressing 05/27/2024 1905 by Alannis Hsia M, RN Outcome: Progressing   Problem: Coping: Goal: Level of anxiety will decrease Outcome: Progressing

## 2024-05-28 DIAGNOSIS — S72142A Displaced intertrochanteric fracture of left femur, initial encounter for closed fracture: Secondary | ICD-10-CM | POA: Diagnosis not present

## 2024-05-28 DIAGNOSIS — E1165 Type 2 diabetes mellitus with hyperglycemia: Secondary | ICD-10-CM | POA: Diagnosis not present

## 2024-05-28 DIAGNOSIS — I482 Chronic atrial fibrillation, unspecified: Secondary | ICD-10-CM | POA: Diagnosis not present

## 2024-05-28 LAB — GLUCOSE, CAPILLARY
Glucose-Capillary: 106 mg/dL — ABNORMAL HIGH (ref 70–99)
Glucose-Capillary: 113 mg/dL — ABNORMAL HIGH (ref 70–99)
Glucose-Capillary: 119 mg/dL — ABNORMAL HIGH (ref 70–99)
Glucose-Capillary: 111 mg/dL — ABNORMAL HIGH (ref 70–99)

## 2024-05-28 LAB — TYPE AND SCREEN
ABO/RH(D): O POS
Antibody Screen: NEGATIVE
Unit division: 0
Unit division: 0

## 2024-05-28 LAB — CBC
HCT: 27.6 % — ABNORMAL LOW (ref 36.0–46.0)
Hemoglobin: 8.4 g/dL — ABNORMAL LOW (ref 12.0–15.0)
MCH: 27.8 pg (ref 26.0–34.0)
MCHC: 30.4 g/dL (ref 30.0–36.0)
MCV: 91.4 fL (ref 80.0–100.0)
Platelets: 144 K/uL — ABNORMAL LOW (ref 150–400)
RBC: 3.02 MIL/uL — ABNORMAL LOW (ref 3.87–5.11)
RDW: 14.9 % (ref 11.5–15.5)
WBC: 9 K/uL (ref 4.0–10.5)
nRBC: 0.6 % — ABNORMAL HIGH (ref 0.0–0.2)

## 2024-05-28 LAB — BPAM RBC
Blood Product Expiration Date: 202510082359
Blood Product Expiration Date: 202509162359
ISSUE DATE / TIME: 202509121110
ISSUE DATE / TIME: 202509091318
Unit Type and Rh: 5100
Unit Type and Rh: 5100

## 2024-05-28 MED ORDER — APIXABAN 2.5 MG PO TABS
2.5000 mg | ORAL_TABLET | Freq: Two times a day (BID) | ORAL | Status: DC
  Administered 2024-05-28 – 2024-05-29 (×4): 2.5 mg via ORAL
  Filled 2024-05-28 (×4): qty 1

## 2024-05-28 NOTE — Progress Notes (Signed)
 [facility]  PROGRESS NOTE  Rachel Hines FMW:[id number] DOB: 1931/09/02 DOA: 05/22/2024 PCP: Rachel Pellet, MD   Brief HPI:    89 y.o. female with a PMH significant for A-fib on Eliquis , CVA, T2DM, HTN, HLD, arthritis and CKD 3A who presents to the ED for evaluation of left hip and shoulder pain after a fall.  ED Course: Initial vitals show patient afebrile and normotensive. Initial labs significant for glucose 171, creatinine 0.91, WBC 12.3, Hgb 9.1. EKG shows A-fib with slightly prolonged QTc of 490. CXR shows no acute cardiopulmonary disease.  X-ray of the left shoulder shows acute mildly impacted fracture of the proximal left humerus and old left clavicle fracture.  X-ray of the pelvis shows acute, comminuted fracture of the proximal left femur. CT head/ cervical spine negative acute. Pt received IV Zofran , IV morphine  and IV Dilaudid .  Orthopedic surgery was consulted for evaluation.    05/24/24 -humerus fracture treated conservatively. Ortho performed hip fixation 9/8.  Requiring blood transfusion today. Significant hematoma of left humerus fracture    Assessment/Plan:   Mechanical fall # Left proximal hip fracture # Left proximal humerus fracture - Patient presented with left hip and humerus pain after tripping and and falling on her left side at home - X-ray of the left shoulder shows acute mildly impacted fracture of the proximal left humerus and X-ray of the pelvis shows acute, comminuted fracture of the proximal left femur. - Orthopedic surgery consulted, plan for IM nailing of the left hip and nonoperative treatment of the left humerus - Continue left shoulder immobilizer - Pain control with scheduled Tylenol  and as needed IV Dilaudid  - Fall precautions - PT, OT   Acute blood loss anemia Hematoma of left bicep- see clinical image for further detail -Hemoglobin dropped t to 6.5 - Has received 2 units PRBC - Today hemoglobin is up to 8.4   # T2DM with  hyperglycemia - A1c 5.8 - sSSI -CBG well-controlled   # A-fib- mildly elevated HR. Decreased home atenolol  due to hypotension but will hopefully be able to increase dose back up to home after transfusion.  - Hemoglobin has stabilized, will restart Eliquis  at 2.5 mg p.o. twice daily, patient takes 5 mg once daily Eliquis  at home. - Telemetry - Continue to hold atenolol  for now  # Vitamin D  deficiency -Vitamin D  level 8.69 -Continue vitamin D  50,000 units oral q. 7 days   # Orthostatic hypotension -Resolved - SBP dropped to 60s on standing - decreased atenolol  and hold olmesartan  (Irbesartan  as substitute) -Will continue to monitor hold atenolol  - Continue TED hose  Acute kidney injury -Likely in setting of hypotension -Renal function slowly improving    # HLD - Continue rosuvastatin    # Insomnia - Continue Ambien  at bedtime   Body mass index is 26.57 kg/m.    Medications     sodium chloride    Intravenous Once   sodium chloride    Intravenous Once   acetaminophen   1,000 mg Oral TID   apixaban   2.5 mg Oral BID   cholecalciferol   2,000 Units Oral BID   feeding supplement (GLUCERNA SHAKE)  237 mL Oral TID BM   insulin  aspart  0-9 Units Subcutaneous TID WC   polyethylene glycol  17 g Oral BID   rosuvastatin   5 mg Oral Daily   Vitamin D  (Ergocalciferol )  50,000 Units Oral Q7 days   zolpidem   5 mg Oral QHS     Data Reviewed:   CBG:  Recent Labs  Lab  05/27/24 1834 05/27/24 1843 05/27/24 2115 05/28/24 0617 05/28/24 1115  GLUCAP 152* 149* 116* 106* 113*    SpO2: 95 % O2 Flow Rate (L/min): 2 L/min    Vitals:   05/28/24 1106 05/28/24 1116 05/28/24 1120 05/28/24 1123  BP: 111/76 119/65 135/76 131/79  Pulse: 65 94 (!) 101 86  Resp:      Temp: 98.6 F (37 C)     TempSrc: Oral     SpO2: 95%     Weight:      Height:          Data Reviewed:  Basic Metabolic Panel: Recent Labs  Lab 05/22/24 1648 05/23/24 0706 05/24/24 0434 05/26/24 0522  05/27/24 0523  NA 139 140 137 137 139  K 4.2 5.1 4.7 4.5 4.7  CL 106 105 108 104 102  CO2 22 22 19* 24 25  GLUCOSE 171* 156* 192* 118* 138*  BUN 23 31* 47* 42* 35*  CREATININE 0.91 1.44* 1.94* 1.22* 1.04*  CALCIUM  8.4* 8.2* 7.9* 8.0* 8.0*    CBC: Recent Labs  Lab 05/22/24 1648 05/23/24 0706 05/24/24 0434 05/25/24 0442 05/26/24 0522 05/27/24 0523 05/27/24 1539 05/28/24 1220  WBC 12.3*   < > 12.7* 14.1* 11.9* 9.4  --  9.0  NEUTROABS 10.6*  --   --   --   --   --   --   --   HGB 9.1*   < > 6.5* 7.4* 7.2* 6.9* 8.4* 8.4*  HCT 29.7*   < > 20.1* 22.4* 22.2* 21.8* 26.3* 27.6*  MCV 92.5   < > 90.1 88.9 91.0 91.6  --  91.4  PLT 145*   < > 122* 132* 169 186  --  144*   < > = values in this interval not displayed.    LFT Recent Labs  Lab 05/22/24 1648 05/26/24 0522 05/27/24 0523  AST 21 20 19   ALT 15 7 6   ALKPHOS 87 57 56  BILITOT 0.5 0.9 1.2  PROT 5.5* 5.2* 5.3*  ALBUMIN 3.4* 2.6* 2.6*     Antibiotics: Anti-infectives (From admission, onward)    Start     Dose/Rate Route Frequency Ordered Stop   05/24/24 0830  ceFAZolin  (ANCEF ) IVPB 2g/100 mL premix        2 g 200 mL/hr over 30 Minutes Intravenous Every 12 hours 05/24/24 0801 05/24/24 2131   05/23/24 1200  ceFAZolin  (ANCEF ) IVPB 2g/100 mL premix  Status:  Discontinued        2 g 200 mL/hr over 30 Minutes Intravenous On call to O.R. 05/22/24 2016 05/24/24 0559        DVT prophylaxis: SCDs  Code Status: Full code  Family Communication: Discussed with patient's family at bedside   CONSULTS    Subjective   Blood pressure has improved   Objective    Physical Examination:   Appears in no acute distress S1-S2, regular, no murmur auscultated Lungs clear to auscultation bilaterally Abdomen is soft, nontender, no organomegaly  Status is: Inpatient:             Rachel Hines   [facility] If 7PM-7AM, please contact night-coverage at [email or web address], Office   [phone]   05/28/2024, 3:42 PM  LOS: 6 days

## 2024-05-28 NOTE — Progress Notes (Signed)
 Occupational Therapy Treatment Patient Details Name: Rachel Hines MRN: [id number] DOB: 1931-09-06 Today's Date: 05/28/2024   History of present illness Rachel Hines is a 89 y.o. female admitted 05/22/24 following fall sustaining left intertrochanteric proximal femur fracture and left proximal humerus fracture. Pt s/p IM nail of left hip 9/8. Continue non-op treatment of left shoulder. PMHx: A-fib on Eliquis , CVA, T2DM, HTN, HLD, OA, osteoporosis, and CKD 3A.   OT comments  Pt. Seen for skilled OT treatment session. Pt. Able to complete sit/stand with MIN A.  MOD A for step pivot from eob to recliner. Some partial step initiation but mostly shimmy feet in the direction of the chair.  Reviewed ROM for LUE.  Pt. Tolerated well for hand/wrist/elbow but remains guarded with any movement of shoulder.  Cont. With acute OT POC and current d/c recommendations.        If plan is discharge home, recommend the following:  Two people to help with walking and/or transfers;Two people to help with bathing/dressing/bathroom;Assistance with cooking/housework;Direct supervision/assist for medications management;Assist for transportation;Direct supervision/assist for financial management;Help with stairs or ramp for entrance;Supervision due to cognitive status   Equipment Recommendations  BSC/3in1;Tub/shower seat    Recommendations for Other Services      Precautions / Restrictions Precautions Precautions: Fall Precaution/Restrictions Comments: Vision Impairments (blind in R eye, 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       Mobility Bed Mobility               General bed mobility  comments: seated eob upon arrival to room    Transfers Overall transfer level: Needs assistance Equipment used: Left platform walker Transfers: Sit to/from Stand, Bed to chair/wheelchair/BSC Sit to Stand: Contact guard assist, From elevated surface     Step pivot transfers: Min assist, Mod assist     General transfer comment: min/mod a for LPFRW management during pivot. cues for step initiation, pt. with 2 notable steps with the rest more shimmy movements. good visible use of pushing through RUE during step initiation.  max cues for sequencing and hand placment prior to sitting down     Balance                                           ADL either performed or assessed with clinical judgement   ADL Overall ADL's : Needs assistance/impaired                         Toilet Transfer: Moderate assistance;Ambulation;Stand-pivot Toilet Transfer Details (indicate cue type and reason): approx. 3-4 shimmy and partial steps to the L from eob to recliner           General ADL Comments: RN completed orthostatics just prior to my arrival and reports pts. BPs wnl, pt. c/o light dizziness after transfer that subsided quickly, rn aware    Extremity/Trunk Assessment Upper Extremity Assessment LUE Deficits / Details: AROM elbow, forearm, wrist, hand, guarded with LUE movement involving shoulder            Vision       Perception  Praxis     Communication Communication Communication: Impaired Factors Affecting Communication: Hearing impaired   Cognition Arousal: Alert Behavior During Therapy: WFL for tasks assessed/performed Cognition: Cognition impaired     Awareness: Intellectual awareness intact, Online awareness intact Memory impairment (select all impairments): Working memory Attention impairment (select first level of impairment): Selective attention Executive functioning impairment (select all impairments): Reasoning, Problem solving,  Organization                   Following commands: Impaired Following commands impaired: Follows one step commands with increased time, Follows multi-step commands inconsistently, Follows multi-step commands with increased time      Cueing   Cueing Techniques: Verbal cues, Visual cues, Tactile cues  Exercises      Shoulder Instructions  Reviewed ROM of LUE with pt. And g.dtr     General Comments      Pertinent Vitals/ Pain       Pain Assessment Pain Assessment: Faces Pain Location: L shoulder and L hip Pain Descriptors / Indicators: Operative site guarding, Grimacing, Discomfort, Aching, Sore Pain Intervention(s): Limited activity within patient's tolerance, Repositioned  Home Living                                          Prior Functioning/Environment              Frequency  Min 2X/week        Progress Toward Goals  OT Goals(current goals can now be found in the care plan section)  Progress towards OT goals: Progressing toward goals     Plan      Co-evaluation                 AM-PAC OT 6 Clicks Daily Activity     Outcome Measure   Help from another person eating meals?: A Little Help from another person taking care of personal grooming?: A Lot Help from another person toileting, which includes using toliet, bedpan, or urinal?: Total Help from another person bathing (including washing, rinsing, drying)?: A Lot Help from another person to put on and taking off regular upper body clothing?: A Lot Help from another person to put on and taking off regular lower body clothing?: Total 6 Click Score: 11    End of Session Equipment Utilized During Treatment: Gait belt;Other (comment) (LPFRW)  OT Visit Diagnosis: Unsteadiness on feet (R26.81);Other abnormalities of gait and mobility (R26.89);Repeated falls (R29.6);Muscle weakness (generalized) (M62.81);History of falling (Z91.81);Pain   Activity Tolerance Patient tolerated  treatment well   Patient Left in chair;with call bell/phone within reach;with family/visitor present   Nurse Communication Mobility status;Other (comment) (rn states ok to work with pt, reports orthostatics wnl, reviewed with rn at end of session brief c/o dizziness but subsided, pt. in recliner reviewed stand pivot for back to bed wtih LPFRW up no longer than 2 hrs)        Time: 8874-8858 OT Time Calculation (min): 16 min  Charges: OT General Charges $OT Visit: 1 Visit OT Treatments $Self Care/Home Management : 8-22 mins  Randall, COTA/L Acute Rehabilitation [phone]   Rachel Hines-COTA/L  05/28/2024, 11:57 AM

## 2024-05-29 DIAGNOSIS — E1165 Type 2 diabetes mellitus with hyperglycemia: Secondary | ICD-10-CM | POA: Diagnosis not present

## 2024-05-29 DIAGNOSIS — S72142A Displaced intertrochanteric fracture of left femur, initial encounter for closed fracture: Secondary | ICD-10-CM | POA: Diagnosis not present

## 2024-05-29 DIAGNOSIS — I482 Chronic atrial fibrillation, unspecified: Secondary | ICD-10-CM | POA: Diagnosis not present

## 2024-05-29 DIAGNOSIS — W19XXXA Unspecified fall, initial encounter: Secondary | ICD-10-CM | POA: Diagnosis not present

## 2024-05-29 LAB — GLUCOSE, CAPILLARY
Glucose-Capillary: 92 mg/dL (ref 70–99)
Glucose-Capillary: 109 mg/dL — ABNORMAL HIGH (ref 70–99)
Glucose-Capillary: 102 mg/dL — ABNORMAL HIGH (ref 70–99)
Glucose-Capillary: 111 mg/dL — ABNORMAL HIGH (ref 70–99)

## 2024-05-29 LAB — CBC
HCT: 26.9 % — ABNORMAL LOW (ref 36.0–46.0)
Hemoglobin: 8.4 g/dL — ABNORMAL LOW (ref 12.0–15.0)
MCH: 28.7 pg (ref 26.0–34.0)
MCHC: 31.2 g/dL (ref 30.0–36.0)
MCV: 91.8 fL (ref 80.0–100.0)
Platelets: 199 K/uL (ref 150–400)
RBC: 2.93 MIL/uL — ABNORMAL LOW (ref 3.87–5.11)
RDW: 14.6 % (ref 11.5–15.5)
WBC: 8.3 K/uL (ref 4.0–10.5)
nRBC: 0.4 % — ABNORMAL HIGH (ref 0.0–0.2)

## 2024-05-29 MED ORDER — ATENOLOL 25 MG PO TABS: 25.0000 mg | ORAL_TABLET | Freq: Every day | ORAL | Status: DC

## 2024-05-29 MED ORDER — POLYETHYLENE GLYCOL 3350 17 G PO PACK: 17.0000 g | PACK | Freq: Every day | ORAL | Status: DC | PRN

## 2024-05-29 MED ORDER — APIXABAN 2.5 MG PO TABS: 2.5000 mg | ORAL_TABLET | Freq: Two times a day (BID) | ORAL | Status: DC

## 2024-05-29 NOTE — Discharge Summary (Signed)
 Physician Discharge Summary   Patient: Rachel Hines MRN: [id number] DOB: 04/22/1931  Admit date:     05/22/2024  Discharge date: 05/29/24  Discharge Physician: Rachel Hines   PCP: Rachel Hines   Recommendations at discharge:   Follow-up orthopedics in 2 weeks  Discharge Diagnoses: Principal Problem:   Intertrochanteric fracture of left femur, closed, initial encounter Rachel Hines) Active Problems:   Fall   Closed 2-part displaced fracture of surgical neck of left humerus   Type 2 diabetes mellitus with hyperglycemia, without long-term current use of insulin  (HCC)   Atrial fibrillation, chronic (HCC)  Resolved Problems:   * No resolved hospital problems. *  Hospital Course: 89 y.o. female with a PMH significant for A-fib on Eliquis , CVA, T2DM, HTN, HLD, arthritis and CKD 3A who presents to the ED for evaluation of left hip and shoulder pain after a fall.  ED Course: Initial vitals show patient afebrile and normotensive. Initial labs significant for glucose 171, creatinine 0.91, WBC 12.3, Hgb 9.1. EKG shows A-fib with slightly prolonged QTc of 490. CXR shows no acute cardiopulmonary disease.  X-ray of the left shoulder shows acute mildly impacted fracture of the proximal left humerus and old left clavicle fracture.  X-ray of the pelvis shows acute, comminuted fracture of the proximal left femur. CT head/ cervical spine negative acute. Pt received IV Zofran , IV morphine  and IV Dilaudid .  Orthopedic surgery was consulted for evaluation.    05/24/24 -humerus fracture treated conservatively. Ortho performed hip fixation 9/8.  Requiring blood transfusion today. Significant hematoma of left humerus fracture  Assessment and Plan:  Mechanical fall # Left proximal hip fracture # Left proximal humerus fracture - Patient presented with left hip and humerus pain after tripping and and falling on her left side at home - X-ray of the left shoulder shows acute mildly impacted fracture of the  proximal left humerus and X-ray of the pelvis shows acute, comminuted fracture of the proximal left femur. - Orthopedic surgery consulted, plan for IM nailing of the left hip and nonoperative treatment of the left humerus - Continue left shoulder immobilizer - Pain control with scheduled Tylenol  and as needed IV Dilaudid  - Fall precautions - Patient to go to skilled nursing facility for rehab   Acute blood loss anemia Hematoma of left bicep- see clinical image for further detail -Hemoglobin dropped  to 6.5 - Has received 2 units PRBC - Today hemoglobin is up to 8.4     # T2DM with hyperglycemia - A1c 5.8    # A-fib- mildly elevated HR. Decreased home atenolol  due to hypotension but will hopefully be able to increase dose back up to home after transfusion.  - Hemoglobin has stabilized, will restart Eliquis  at 2.5 mg p.o. twice daily, patient takes 5 mg once daily Eliquis  at home. - Restart atenolol  at lower dose of 25 mg daily   # Vitamin D  deficiency -Vitamin D  level 8.69 -Continue vitamin D  50,000 units oral q. 7 days   # Orthostatic hypotension -Resolved - SBP dropped to 60s on standing - decreased atenolol  and hold olmesartan  (Irbesartan  as substitute) - Continue TED hose -Discontinue olmesartan , cut down the dose of atenolol  to 25 mg daily   Acute kidney injury -Likely in setting of hypotension Resolved     # HLD - Continue rosuvastatin    # Insomnia - Continue Ambien  5 mg daily at bedtime        Consultants: Orthopedics Procedures performed: IM nailing of left hip Disposition: Home Diet  recommendation:  Discharge Diet Orders (From admission, onward)     Start     Ordered   05/29/24 0000  Diet - low sodium heart healthy        05/29/24 1300           Regular diet DISCHARGE MEDICATION: Allergies as of 05/29/2024       Reactions   Atorvastatin Other (See Comments)   Severe muscle pain and bone pain   Other Rash, Other (See Comments)   Food dyes-  Rashes and joint pain        Medication List     STOP taking these medications    olmesartan  40 MG tablet Commonly known as: BENICAR    oxyCODONE -acetaminophen  5-325 MG tablet Commonly known as: PERCOCET/ROXICET       TAKE these medications    acetaminophen  500 MG tablet Commonly known as: TYLENOL  Take 2 tablets (1,000 mg total) by mouth every 8 (eight) hours as needed for mild pain (pain score 1-3) or moderate pain (pain score 4-6). What changed:  when to take this reasons to take this   apixaban  2.5 MG Tabs tablet Commonly known as: ELIQUIS  Take 1 tablet (2.5 mg total) by mouth 2 (two) times daily. What changed:  medication strength how much to take   atenolol  25 MG tablet Commonly known as: Tenormin  Take 1 tablet (25 mg total) by mouth daily. What changed:  medication strength how much to take when to take this   oxyCODONE  5 MG immediate release tablet Commonly known as: Oxy IR/ROXICODONE  Take 0.5-1 tablets (2.5-5 mg total) by mouth every 4 (four) hours as needed for moderate pain (pain score 4-6) or severe pain (pain score 7-10).   polyethylene glycol 17 g packet Commonly known as: MIRALAX  / GLYCOLAX  Take 17 g by mouth daily as needed.   rosuvastatin  5 MG tablet Commonly known as: CRESTOR  TAKE 1 TABLET BY MOUTH DAILY What changed: when to take this   Vitamin D  (Ergocalciferol ) 1.25 MG (50000 UNIT) Caps capsule Commonly known as: DRISDOL  Take 1 capsule (50,000 Units total) by mouth every 7 (seven) days. Start taking on: May 31, 2024   vitamin D3 25 MCG tablet Commonly known as: CHOLECALCIFEROL  Take 2 tablets (2,000 Units total) by mouth 2 (two) times daily.   zolpidem  10 MG tablet Commonly known as: AMBIEN  Take 5 mg by mouth at bedtime.        Follow-up Information     Rachel Sharper, Hines. Schedule an appointment as soon as possible for a visit in 2 week(s).   Specialty: Orthopedic Surgery Contact information: [address] [place] [address] [phone]                Discharge Exam: Rachel Hines   05/22/24 1629 05/23/24 1254  Weight: 68 kg 68 kg   Vitals:   05/29/24 0804 05/29/24 1314  BP: 107/76 (!) 103/44  Pulse: 64 80  Resp: 15 16  Temp: 97.7 F (36.5 C) 97.9 F (36.6 C)  SpO2: (!) 87% 95%     General-appears in no acute distress Heart-S1-S2, regular, no murmur auscultated Lungs-clear to auscultation bilaterally, no wheezing or crackles auscultated Abdomen-soft, nontender, no organomegaly Extremities-no edema in the lower extremities Neuro-alert, oriented x3, no focal deficit noted  Condition at discharge: good  The results of significant diagnostics from this hospitalization (including imaging, microbiology, ancillary and laboratory) are listed below for reference.   Imaging Studies: DG FEMUR MIN 2 VIEWS LEFT Result Date: 05/24/2024 CLINICAL DATA:  Postop. EXAM: LEFT FEMUR 2 VIEWS COMPARISON:  May 22, 2024 FINDINGS: Since the prior exam there is been interval placement of a radiopaque intramedullary rod and compression screw device within the proximal left femur. An acute inter trochanteric fracture deformities again seen with postoperative anatomic alignment. There is no evidence of dislocation. Soft tissues are unremarkable. IMPRESSION: Status post ORIF of the proximal left femur. Electronically Signed   By: Suzen Dials M.D.   On: 05/24/2024 13:43   CT SHOULDER LEFT WO CONTRAST Result Date: 05/23/2024 CLINICAL DATA:  Follow-up shoulder surgery EXAM: CT OF THE UPPER LEFT EXTREMITY WITHOUT CONTRAST TECHNIQUE: Multidetector CT imaging of the upper left extremity was performed according to the standard protocol. RADIATION DOSE REDUCTION: This exam was performed according to the departmental dose-optimization program which includes automated exposure control, adjustment of the mA and/or kV according to patient size and/or use of iterative reconstruction technique.  COMPARISON:  05/22/2024 FINDINGS: Bones/Joint/Cartilage Impacted left humeral neck fracture noted. Mild posterior displacement of the humeral head relative to the neck, best seen on sagittal imaging. No subluxation or dislocation. 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 Ligaments Suboptimally assessed by CT. Muscles and Tendons Unremarkable Soft tissues Soft tissue swelling/edema within the subcutaneous soft tissues. IMPRESSION: Impacted left humeral neck fracture with mild posterior displacement of the humeral head relative to the neck and shaft. 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 Electronically Signed   By: Franky Crease M.D.   On: 05/23/2024 23:11   DG FEMUR MIN 2 VIEWS LEFT Result Date: 05/23/2024 CLINICAL DATA:  Internal fixation EXAM: LEFT FEMUR 2 VIEWS COMPARISON:  05/22/2024 FINDINGS: Multiple intraoperative spot images demonstrate internal fixation across the left femoral intertrochanteric fracture. Anatomic alignment. No hardware complicating feature. IMPRESSION: Internal fixation without visible complicating feature. Electronically Signed   By: Franky Crease M.D.   On: 05/23/2024 19:30   DG C-Arm 1-60 Min-No Report Result Date: 05/23/2024 Fluoroscopy was utilized by the requesting physician.  No radiographic interpretation.   DG C-Arm 1-60 Min-No Report Result Date: 05/23/2024 Fluoroscopy was utilized by the requesting physician.  No radiographic interpretation.   DG Knee Left Port Result Date: 05/22/2024 CLINICAL DATA:  Left hip fracture, fell EXAM: PORTABLE LEFT KNEE - 1-2 VIEW COMPARISON:  05/22/2024 FINDINGS: Frontal and cross-table lateral views of the left knee are obtained. There is severe 3 compartmental osteoarthritis greatest in the medial compartment. No acute fracture, subluxation, or dislocation. No joint effusion. Soft tissues are unremarkable. IMPRESSION: 1. Severe 3 compartmental osteoarthritis.  No acute fracture. Electronically Signed   By: Ozell Daring  M.D.   On: 05/22/2024 21:47   CT Cervical Spine Wo Contrast Result Date: 05/22/2024 CLINICAL DATA:  Neck trauma (Age >= 65y).  Fall. EXAM: CT CERVICAL SPINE WITHOUT CONTRAST TECHNIQUE: Multidetector CT imaging of the cervical spine was performed without intravenous contrast. Multiplanar CT image reconstructions were also generated. RADIATION DOSE REDUCTION: This exam was performed according to the departmental dose-optimization program which includes automated exposure control, adjustment of the mA and/or kV according to patient size and/or use of iterative reconstruction technique. COMPARISON:  None Available. FINDINGS: Alignment: No subluxation Skull base and vertebrae: No acute fracture. No primary bone lesion or focal pathologic process. Soft tissues and spinal canal: No prevertebral fluid or swelling. No visible canal hematoma. Disc levels: Early disc space narrowing throughout the cervical spine. Mild to moderate bilateral degenerative facet disease. Upper chest: Trace left pleural effusion partially visualized. Other: None IMPRESSION: No acute bony abnormality. Electronically Signed  By: Franky Crease M.D.   On: 05/22/2024 19:16   CT Head Wo Contrast Result Date: 05/22/2024 CLINICAL DATA:  Head trauma, minor (Age >= 65y).  Fall. EXAM: CT HEAD WITHOUT CONTRAST TECHNIQUE: Contiguous axial images were obtained from the base of the skull through the vertex without intravenous contrast. RADIATION DOSE REDUCTION: This exam was performed according to the departmental dose-optimization program which includes automated exposure control, adjustment of the mA and/or kV according to patient size and/or use of iterative reconstruction technique. COMPARISON:  04/25/2022 FINDINGS: Brain: Old left occipital infarct. There is atrophy and chronic small vessel disease changes. No acute intracranial abnormality. Specifically, no hemorrhage, hydrocephalus, mass lesion, acute infarction, or significant intracranial injury.  Vascular: No hyperdense vessel or unexpected calcification. Skull: No acute calvarial abnormality. Sinuses/Orbits: No acute findings Other: None IMPRESSION: Atrophy, chronic microvascular disease. No acute intracranial abnormality. Old left occipital infarct. Electronically Signed   By: Franky Crease M.D.   On: 05/22/2024 19:14   DG Chest 1 View Result Date: 05/22/2024 CLINICAL DATA:  Status post fall. EXAM: CHEST  1 VIEW COMPARISON:  None Available. FINDINGS: The cardiac silhouette is enlarged. Both lungs are clear. There is an acute fracture deformity involving the head and neck of the proximal left humerus. An additional fracture of indeterminate age is seen involving the mid to distal left clavicle. A chronic fracture of the mid right clavicle is noted. IMPRESSION: 1. Acute fracture of the proximal left humerus. 2. Fracture of indeterminate age involving the mid to distal left clavicle. 3. Chronic fracture of the mid right clavicle. Electronically Signed   By: Suzen Dials M.D.   On: 05/22/2024 17:50   DG Hip Unilat W or Wo Pelvis 2-3 Views Left Result Date: 05/22/2024 CLINICAL DATA:  Status post fall. EXAM: DG HIP (WITH OR WITHOUT PELVIS) 2-3V LEFT COMPARISON:  None Available. FINDINGS: An acute, comminuted fracture deformity is seen extending through the inter trochanteric region of the proximal left femur. Mild displacement of the lesser trochanter is seen. There is no evidence of dislocation. Soft tissue structures are unremarkable. IMPRESSION: Acute, comminuted fracture of the proximal left femur. Electronically Signed   By: Suzen Dials M.D.   On: 05/22/2024 17:48   DG Shoulder Left Result Date: 05/22/2024 CLINICAL DATA:  Status post fall. EXAM: LEFT SHOULDER - 2+ VIEW COMPARISON:  None Available. FINDINGS: And acute mildly impacted fracture deformity is seen involving the head and neck of the proximal left humerus. An ill-defined deformity of indeterminate age is also seen involving the mid  to distal left clavicle. There is no evidence of dislocation. Degenerative changes seen involving the left glenohumeral articulation. Diffuse soft tissue swelling is present. IMPRESSION: 1. Acute mildly impacted fracture of the proximal left humerus. 2. Findings which may represent a fracture of the mid to distal left clavicle of indeterminate age. Correlation with physical examination is recommended to determine the presence of point tenderness. Electronically Signed   By: Suzen Dials M.D.   On: 05/22/2024 17:46    Microbiology: No results found for this or any previous visit.  Labs: CBC: Recent Labs  Lab 05/22/24 1648 05/23/24 0706 05/25/24 0442 05/26/24 0522 05/27/24 0523 05/27/24 1539 05/28/24 1220 05/29/24 0345  WBC 12.3*   < > 14.1* 11.9* 9.4  --  9.0 8.3  NEUTROABS 10.6*  --   --   --   --   --   --   --   HGB 9.1*   < > 7.4* 7.2* 6.9*  8.4* 8.4* 8.4*  HCT 29.7*   < > 22.4* 22.2* 21.8* 26.3* 27.6* 26.9*  MCV 92.5   < > 88.9 91.0 91.6  --  91.4 91.8  PLT 145*   < > 132* 169 186  --  144* 199   < > = values in this interval not displayed.   Basic Metabolic Panel: Recent Labs  Lab 05/22/24 1648 05/23/24 0706 05/24/24 0434 05/26/24 0522 05/27/24 0523  NA 139 140 137 137 139  K 4.2 5.1 4.7 4.5 4.7  CL 106 105 108 104 102  CO2 22 22 19* 24 25  GLUCOSE 171* 156* 192* 118* 138*  BUN 23 31* 47* 42* 35*  CREATININE 0.91 1.44* 1.94* 1.22* 1.04*  CALCIUM  8.4* 8.2* 7.9* 8.0* 8.0*   Liver Function Tests: Recent Labs  Lab 05/22/24 1648 05/26/24 0522 05/27/24 0523  AST 21 20 19   ALT 15 7 6   ALKPHOS 87 57 56  BILITOT 0.5 0.9 1.2  PROT 5.5* 5.2* 5.3*  ALBUMIN 3.4* 2.6* 2.6*   CBG: Recent Labs  Lab 05/28/24 1115 05/28/24 1600 05/28/24 2132 05/29/24 0636 05/29/24 1232  GLUCAP 113* 119* 111* 92 109*    Discharge time spent: greater than 30 minutes.  Signed: [name], Hines [facility] 05/29/2024

## 2024-05-29 NOTE — TOC Transition Note (Addendum)
 Transition of Care [facility]) - Discharge Note   Patient Details  Name: Rachel Hines MRN: [id number] Date of Birth: [birth year]  Transition of Care [facility]) CM/SW Contact:  Ermalinda Penton [place], [place] Phone Number: 05/29/2024, 2:04 PM   Clinical Narrative:     Patient to discharge to [facility] today. [facility] contacted at 2:04pm. Patient's daughter made aware of discharge today. Patient will be going to room 113 [facility] 2.  RN to call report [phone]    [facility], LCSW Transition of Care      Barriers to Discharge: Continued Medical Work up, SNF Pending bed offer   Patient Goals and CMS Choice Patient states their goals for this hospitalization and ongoing recovery are:: I hadn't thought about that CMS Medicare.gov Compare Post Acute Care list provided to:: Patient Choice offered to / list presented to : Patient      Discharge Placement                       Discharge Plan and Services Additional resources added to the After Visit Summary for   In-house Referral: Clinical Social Work   Post Acute Care Choice: Skilled Nursing Facility                               Social Drivers of Health (SDOH) Interventions SDOH Screenings   Food Insecurity: No Food Insecurity (05/22/2024)  Housing: Low Risk  (05/22/2024)  Transportation Needs: No Transportation Needs (05/22/2024)  Utilities: Not At Risk (05/22/2024)  Social Connections: Patient Declined (05/22/2024)  Tobacco Use: Low Risk  (05/23/2024)     Readmission Risk Interventions     No data to display

## 2024-05-29 NOTE — Progress Notes (Signed)
 As requested by patient's daughter Mrs.Udall Leech, [facility] was called who notified that the patient will be the 8th patient in the list to be transported. Patient's daughter requested not to get patient disturbed at night and if the transportation can be moved for the morning. House supervisor Norleen was notified about the situation, suggested to call PTR to get the time rescheduled. PTR was called, she is in the schedule for 7 am in the morning tomorrow. RN in [facility] notified about the changes.

## 2024-05-29 NOTE — Progress Notes (Signed)
 [facility] called to get report, spoke with Eleanor Heckle, LPN who is the Production designer, theatre/television/film. AVS reviewed. 2 daughters at bedside aware of pt discharging today via [facility].

## 2024-05-30 DIAGNOSIS — S42209A Unspecified fracture of upper end of unspecified humerus, initial encounter for closed fracture: Secondary | ICD-10-CM | POA: Diagnosis not present

## 2024-05-30 DIAGNOSIS — K219 Gastro-esophageal reflux disease without esophagitis: Secondary | ICD-10-CM | POA: Diagnosis not present

## 2024-05-30 DIAGNOSIS — Z9181 History of falling: Secondary | ICD-10-CM | POA: Diagnosis not present

## 2024-05-30 DIAGNOSIS — E559 Vitamin D deficiency, unspecified: Secondary | ICD-10-CM | POA: Diagnosis not present

## 2024-05-30 DIAGNOSIS — I482 Chronic atrial fibrillation, unspecified: Secondary | ICD-10-CM | POA: Diagnosis not present

## 2024-05-30 DIAGNOSIS — R2681 Unsteadiness on feet: Secondary | ICD-10-CM | POA: Diagnosis not present

## 2024-05-30 DIAGNOSIS — R1310 Dysphagia, unspecified: Secondary | ICD-10-CM | POA: Diagnosis not present

## 2024-05-30 DIAGNOSIS — R2689 Other abnormalities of gait and mobility: Secondary | ICD-10-CM | POA: Diagnosis not present

## 2024-05-30 DIAGNOSIS — D62 Acute posthemorrhagic anemia: Secondary | ICD-10-CM | POA: Diagnosis not present

## 2024-05-30 DIAGNOSIS — S42212D Unspecified displaced fracture of surgical neck of left humerus, subsequent encounter for fracture with routine healing: Secondary | ICD-10-CM | POA: Diagnosis not present

## 2024-05-30 DIAGNOSIS — I129 Hypertensive chronic kidney disease with stage 1 through stage 4 chronic kidney disease, or unspecified chronic kidney disease: Secondary | ICD-10-CM | POA: Diagnosis not present

## 2024-05-30 DIAGNOSIS — M6281 Muscle weakness (generalized): Secondary | ICD-10-CM | POA: Diagnosis not present

## 2024-05-30 DIAGNOSIS — N1831 Chronic kidney disease, stage 3a: Secondary | ICD-10-CM | POA: Diagnosis not present

## 2024-05-30 DIAGNOSIS — H02403 Unspecified ptosis of bilateral eyelids: Secondary | ICD-10-CM | POA: Diagnosis not present

## 2024-05-30 DIAGNOSIS — S72142D Displaced intertrochanteric fracture of left femur, subsequent encounter for closed fracture with routine healing: Secondary | ICD-10-CM | POA: Diagnosis not present

## 2024-05-30 DIAGNOSIS — Z7401 Bed confinement status: Secondary | ICD-10-CM | POA: Diagnosis not present

## 2024-05-30 DIAGNOSIS — S42202D Unspecified fracture of upper end of left humerus, subsequent encounter for fracture with routine healing: Secondary | ICD-10-CM | POA: Diagnosis not present

## 2024-05-30 DIAGNOSIS — E1122 Type 2 diabetes mellitus with diabetic chronic kidney disease: Secondary | ICD-10-CM | POA: Diagnosis not present

## 2024-05-30 DIAGNOSIS — S7292XA Unspecified fracture of left femur, initial encounter for closed fracture: Secondary | ICD-10-CM | POA: Diagnosis not present

## 2024-05-30 DIAGNOSIS — G4700 Insomnia, unspecified: Secondary | ICD-10-CM | POA: Diagnosis not present

## 2024-05-30 DIAGNOSIS — Z961 Presence of intraocular lens: Secondary | ICD-10-CM | POA: Diagnosis not present

## 2024-05-30 DIAGNOSIS — E785 Hyperlipidemia, unspecified: Secondary | ICD-10-CM | POA: Diagnosis not present

## 2024-05-30 DIAGNOSIS — R41841 Cognitive communication deficit: Secondary | ICD-10-CM | POA: Diagnosis not present

## 2024-05-30 DIAGNOSIS — H353132 Nonexudative age-related macular degeneration, bilateral, intermediate dry stage: Secondary | ICD-10-CM | POA: Diagnosis not present

## 2024-05-30 DIAGNOSIS — Z947 Corneal transplant status: Secondary | ICD-10-CM | POA: Diagnosis not present

## 2024-05-30 LAB — CBC
HCT: 25.3 % — ABNORMAL LOW (ref 36.0–46.0)
Hemoglobin: 7.9 g/dL — ABNORMAL LOW (ref 12.0–15.0)
MCH: 28.6 pg (ref 26.0–34.0)
MCHC: 31.2 g/dL (ref 30.0–36.0)
MCV: 91.7 fL (ref 80.0–100.0)
Platelets: 175 K/uL (ref 150–400)
RBC: 2.76 MIL/uL — ABNORMAL LOW (ref 3.87–5.11)
RDW: 15 % (ref 11.5–15.5)
WBC: 8 K/uL (ref 4.0–10.5)
nRBC: 0.2 % (ref 0.0–0.2)

## 2024-05-30 LAB — GLUCOSE, CAPILLARY: Glucose-Capillary: 80 mg/dL (ref 70–99)

## 2024-05-30 NOTE — Progress Notes (Signed)
 Discharge summary packet(AVS) provided to [facility]. Pt d/c to [facility] facility as ordered. She remains alert in no apparent distress. NO complaints.

## 2024-06-06 ENCOUNTER — Encounter

## 2024-06-13 DIAGNOSIS — S42212D Unspecified displaced fracture of surgical neck of left humerus, subsequent encounter for fracture with routine healing: Secondary | ICD-10-CM | POA: Diagnosis not present

## 2024-06-13 DIAGNOSIS — S72142D Displaced intertrochanteric fracture of left femur, subsequent encounter for closed fracture with routine healing: Secondary | ICD-10-CM | POA: Diagnosis not present

## 2024-06-15 DIAGNOSIS — R2242 Localized swelling, mass and lump, left lower limb: Secondary | ICD-10-CM | POA: Diagnosis not present

## 2024-06-15 DIAGNOSIS — M79605 Pain in left leg: Secondary | ICD-10-CM | POA: Diagnosis not present

## 2024-06-20 ENCOUNTER — Encounter

## 2024-07-01 ENCOUNTER — Emergency Department (HOSPITAL_COMMUNITY)

## 2024-07-01 ENCOUNTER — Emergency Department (HOSPITAL_COMMUNITY)
Admission: EM | Admit: 2024-07-01 | Discharge: 2024-07-01 | Disposition: A | Discharge status: Home / Self Care | Source: Skilled Nursing Facility | Attending: Emergency Medicine | Admitting: Emergency Medicine

## 2024-07-01 DIAGNOSIS — Z043 Encounter for examination and observation following other accident: Secondary | ICD-10-CM | POA: Diagnosis not present

## 2024-07-01 DIAGNOSIS — S0990XA Unspecified injury of head, initial encounter: Secondary | ICD-10-CM | POA: Diagnosis not present

## 2024-07-01 DIAGNOSIS — I7122 Aneurysm of the aortic arch, without rupture: Secondary | ICD-10-CM | POA: Diagnosis not present

## 2024-07-01 DIAGNOSIS — R519 Headache, unspecified: Secondary | ICD-10-CM | POA: Diagnosis present

## 2024-07-01 DIAGNOSIS — E119 Type 2 diabetes mellitus without complications: Secondary | ICD-10-CM | POA: Insufficient documentation

## 2024-07-01 DIAGNOSIS — S42202A Unspecified fracture of upper end of left humerus, initial encounter for closed fracture: Secondary | ICD-10-CM | POA: Diagnosis not present

## 2024-07-01 DIAGNOSIS — S199XXA Unspecified injury of neck, initial encounter: Secondary | ICD-10-CM | POA: Diagnosis not present

## 2024-07-01 DIAGNOSIS — M160 Bilateral primary osteoarthritis of hip: Secondary | ICD-10-CM | POA: Diagnosis not present

## 2024-07-01 DIAGNOSIS — I10 Essential (primary) hypertension: Secondary | ICD-10-CM | POA: Diagnosis not present

## 2024-07-01 DIAGNOSIS — G9389 Other specified disorders of brain: Secondary | ICD-10-CM | POA: Diagnosis not present

## 2024-07-01 DIAGNOSIS — I4891 Unspecified atrial fibrillation: Secondary | ICD-10-CM | POA: Insufficient documentation

## 2024-07-01 DIAGNOSIS — S0083XA Contusion of other part of head, initial encounter: Secondary | ICD-10-CM | POA: Diagnosis not present

## 2024-07-01 DIAGNOSIS — R220 Localized swelling, mass and lump, head: Secondary | ICD-10-CM | POA: Diagnosis not present

## 2024-07-01 DIAGNOSIS — M4312 Spondylolisthesis, cervical region: Secondary | ICD-10-CM | POA: Diagnosis not present

## 2024-07-01 DIAGNOSIS — Z7901 Long term (current) use of anticoagulants: Secondary | ICD-10-CM | POA: Diagnosis not present

## 2024-07-01 DIAGNOSIS — S0993XA Unspecified injury of face, initial encounter: Secondary | ICD-10-CM | POA: Diagnosis not present

## 2024-07-01 DIAGNOSIS — W010XXA Fall on same level from slipping, tripping and stumbling without subsequent striking against object, initial encounter: Secondary | ICD-10-CM | POA: Diagnosis not present

## 2024-07-01 DIAGNOSIS — W19XXXA Unspecified fall, initial encounter: Secondary | ICD-10-CM | POA: Diagnosis not present

## 2024-07-01 DIAGNOSIS — M47812 Spondylosis without myelopathy or radiculopathy, cervical region: Secondary | ICD-10-CM | POA: Diagnosis not present

## 2024-07-01 NOTE — ED Notes (Signed)
 Called [facility] FOR PT PICK

## 2024-07-01 NOTE — ED Notes (Signed)
 PT is sitting up in bed with daughter bedside.

## 2024-07-01 NOTE — ED Notes (Addendum)
 Pt's daughter and daughter's husband asking for a wheelchair to take the patient back to [facility]. I informed the daughter again of impending [facility] transport, called [facility] and was informed the pt was next on the list. Daughter made aware.

## 2024-07-01 NOTE — ED Notes (Signed)
 PT' s daughter states that [facility] is coming to pick up her mom, this RN stated that [facility] had already been called so we need to cancel one. The PT's daughter then stated that never mind I lied, [facility] isnt coming. This RN then told the PT's daughter that I can call and find out.PT'S daughter stated that [employer] will not answer so I can call if I  would like.At this time,this RN asked the family member if she would like to step back into her room while I was having a conversation with [facility] about another PT. Family member refused and kept stating how she works for [employer]. This RN then reminded her that if she did work for [employer] she should know that its a HIPAA violation for her to be standing behind me listening to another PT's medical info.Family member did return to her room after security arrived.

## 2024-07-01 NOTE — Progress Notes (Signed)
 Orthopedic Tech Progress Note Patient Details:  Rachel Hines 08-13-1931 [id number]  Patient ID: Rachel Hines, female   DOB: 13-Aug-1931, 89 y.o.   MRN: [id number] Level 2 Trauma FOT [name] [name] 07/01/2024, 11:47 AM

## 2024-07-01 NOTE — ED Triage Notes (Signed)
 PT BIB EMS from [facility]. Patient had a mechanical fall. EMS reported patient trip over her feet. Patient fell forward and hit her head on wood floor. Patient got off the floor, this is when the staff realized patient had fallen. She is on Eliquis  and history of Afib. She has a hematoma on right side of the forehead and on the left cheek. Patient is A & O x4. She denies Loc, or dizziness. Vital signs: Bp 138/82, Hr 76. RR 16, Spo2 92% Ra and CBG 131

## 2024-07-01 NOTE — Discharge Instructions (Signed)
You were seen in the Emergency Department (ED) today for a head injury.  Based on your evaluation, you may have sustained a concussion (or bruise) to your brain.  If you had a CT scan done, it did not show any evidence of serious injury or bleeding.   ° °Symptoms to expect from a concussion include nausea, mild to moderate headache, difficulty concentrating or sleeping, and mild lightheadedness.  These symptoms should improve over the next few days to weeks, but it may take many weeks before you feel back to normal.  Return to the emergency department or follow-up with your primary care doctor if your symptoms are not improving over this time. ° °Signs of a more serious head injury include vomiting, severe headache, excessive sleepiness or confusion, and weakness or numbness in your face, arms or legs.  Return immediately to the Emergency Department if you experience any of these more concerning symptoms.   ° °Rest, avoid strenuous physical or mental activity, and avoid activities that could potentially result in another head injury until all your symptoms from this head injury are completely resolved for at least 2-3 weeks.  You may take acetaminophen over the counter according to label instructions for mild headache or scalp soreness. ° °

## 2024-07-01 NOTE — ED Notes (Signed)
 Patient stood up with minimal assistance. She was able to stand okay without falling.

## 2024-07-01 NOTE — ED Notes (Signed)
 This RN called [facility] to make them aware that [facility] is going to pick their PT up and transportation can be cancelled.

## 2024-07-01 NOTE — ED Notes (Signed)
 Report called to the nursing supervisor at [facility], Situation explained regarding patient leaving the ER with daughter. Discharge paperwork to be faxed to the facility.

## 2024-07-01 NOTE — ED Notes (Signed)
 Trauma Response Nurse Documentation  Rachel Hines is a 89 y.o. female arriving to [facility] ED via EMS  On Eliquis  (apixaban ) daily. Trauma was activated as a Level 2 based on the following trauma criteria Elderly patients > 65 with head trauma on anti-coagulation (excluding ASA).  Patient cleared for CT by Dr. Darra. Pt transported to CT with trauma response nurse present to monitor. RN remained with the patient throughout their absence from the department for clinical observation. GCS 15.  History   Past Medical History:  Diagnosis Date   Acute meniscal injury of left knee    Arthritis    Atrial fibrillation (HCC)    GERD (gastroesophageal reflux disease)    H/O hiatal hernia    Heart block AV first degree    History of esophageal dilatation    Hyperlipidemia    Hypertension    Impaired hearing    Left knee pain    severe   Type 2 diabetes mellitus (HCC)    Wears hearing aid      Past Surgical History:  Procedure Laterality Date   ABDOMINAL HYSTERECTOMY  1960'S   CATARACT EXTRACTION W/ INTRAOCULAR LENS IMPLANT Right    CHONDROPLASTY  04/14/2013   Procedure:  ABRATION CHONDROPLASTY OF MEDIAL CHONDIAL;  Surgeon: Tanda DELENA Heading, MD;  Location: [facility];  Service: Orthopedics;;   INTRAMEDULLARY (IM) NAIL INTERTROCHANTERIC Left 05/23/2024   Procedure: FIXATION, FRACTURE, INTERTROCHANTERIC, WITH INTRAMEDULLARY ROD;  Surgeon: Celena Sharper, MD;  Location: [facility] OR;  Service: Orthopedics;  Laterality: Left;   KNEE ARTHROSCOPY WITH LATERAL MENISECTOMY Left 04/14/2013   Procedure: KNEE ARTHROSCOPY WITH LATERAL MENISECTOMY;  Surgeon: Tanda DELENA Heading, MD;  Location: [facility];  Service: Orthopedics;  Laterality: Left;   KNEE ARTHROSCOPY WITH MEDIAL MENISECTOMY Left 04/14/2013   Procedure: LEFT KNEE ARTHROSCOPY WITH MEDIAL MENISECTOMY;  Surgeon: Tanda DELENA Heading, MD;  Location: [facility];  Service: Orthopedics;  Laterality: Left;   TOTAL  KNEE ARTHROPLASTY Right 2011     Initial Focused Assessment (If applicable, or please see trauma documentation): Patient A&Ox4, GCS 15, PERR 3 Airway intact, bilateral breath sounds Pulses 2+  CT's Completed:   CT Head, CT Maxillofacial, and CT C-Spine   Interventions:  CXR/PXR CT Head/Cspine/Maxillofacial  Plan for disposition:  Discharge home   Event Summary: Patient to ED after a mechanical fall. Per patient she was walking and tripped over her feet. Patient takes Eliquis  for Afib, imaging revealed no traumatic injury. Patient able to ambulate and discharge back to [facility].  Bedside handoff with ED RN Flavia.    Alan CROME Quindon Denker  Trauma Response RN  Please call TRN at [phone] for further assistance.

## 2024-07-01 NOTE — ED Notes (Addendum)
 Was on the phone regarding another patient when the pt's daughter and daughter's husband put the patient into a visitor chair and dragged the patient out of the ER. Pt appeared calm and awake sitting up in the chair while passing by the nurse's station. Security was called and offered a wheelchair. Pt's family denied the offer of a wheelchair and appeared visibly upset. Charge nurse notified and pt allowed to leave. [facility] to be called to cancel transport.

## 2024-07-01 NOTE — ED Notes (Signed)
 Pt's daughter at the nursing station asking about the ETA of [facility] and if she can take the patient home herself. Informed the daughter that the pt was being transported via [facility] for her own safety due to her presenting complaint and history. Daughter asking about leaving AMA and taking the patient home herself.

## 2024-07-01 NOTE — ED Provider Notes (Signed)
 Emergency Department Provider Note   I have reviewed the triage vital signs and the nursing notes.   HISTORY  Chief Complaint No chief complaint on file.   HPI Rachel Hines is a 89 y.o. female with past history reviewed below on Eliquis  for A-fib presents to the emergency department after mechanical fall.  Patient states she tripped over her feet landing on the ground.  She sustained bruising to the left cheek as well as her right forehead.  No loss of consciousness.  She has minimal headache at this time.  No increased confusion.  She is recovering from fractures of her left arm and leg currently and so has some residual pain in those locations but nothing worse than normal.  No chest pain or trouble breathing.  No abdominal discomfort.   Past Medical History:  Diagnosis Date   Acute meniscal injury of left knee    Arthritis    Atrial fibrillation (HCC)    GERD (gastroesophageal reflux disease)    H/O hiatal hernia    Heart block AV first degree    History of esophageal dilatation    Hyperlipidemia    Hypertension    Impaired hearing    Left knee pain    severe   Type 2 diabetes mellitus (HCC)    Wears hearing aid     Review of Systems  Constitutional: No fever/chills Cardiovascular: Denies chest pain. Respiratory: Denies shortness of breath. Gastrointestinal: No abdominal pain.  No nausea, no vomiting.   Genitourinary: Negative for dysuria. Musculoskeletal: Positive baseline pain on the left arm.  Skin: Negative for rash. Neurological: Mild HA.   ____________________________________________   PHYSICAL EXAM:  VITAL SIGNS: ED Triage Vitals  Encounter Vitals Group     BP      Girls Systolic BP Percentile      Girls Diastolic BP Percentile      Boys Systolic BP Percentile      Boys Diastolic BP Percentile      Pulse      Resp      Temp      Temp src      SpO2      Weight      Height      Head Circumference      Peak Flow      Pain Score       Pain Loc      Pain Education      Exclude from Growth Chart    Constitutional: Alert and oriented. Well appearing and in no acute distress. Eyes: Conjunctivae are normal. PERRL. EOMI. Head: Hematoma to the right forehead. Hematoma to the left cheek.  Nose: No congestion/rhinnorhea. Mouth/Throat: Mucous membranes are moist.   Neck: No stridor. C collar in place.  Cardiovascular: Normal rate, regular rhythm. Good peripheral circulation. Grossly normal heart sounds.   Respiratory: Normal respiratory effort.  No retractions. Lungs CTAB. Gastrointestinal: Soft and nontender. No distention.  Musculoskeletal: No lower extremity tenderness nor edema. No gross deformities of extremities. Neurologic:  Normal speech and language. No gross focal neurologic deficits are appreciated.  Skin:  Skin is warm, dry and intact. No rash noted.  ____________________________________________  RADIOLOGY  No results found.  ____________________________________________   PROCEDURES  Procedure(s) performed:   Procedures  None  ____________________________________________   INITIAL IMPRESSION / ASSESSMENT AND PLAN / ED COURSE  Pertinent labs & imaging results that were available during my care of the patient were reviewed by me and considered in my medical  decision making (see chart for details).   This patient is Presenting for Evaluation of head injury, which does require a range of treatment options, and is a complaint that involves a high risk of morbidity and mortality.  The Differential Diagnoses includes subdural hematoma, epidural hematoma, acute concussion, traumatic subarachnoid hemorrhage, cerebral contusions, etc.   I did obtain Additional Historical Information from EMS.  Radiologic Tests Ordered, included CT head, max face, c spine, and XR. I independently interpreted the images and agree with radiology interpretation.   Medical Decision Making: Summary:  The patient presents the  emergency department for evaluation after mechanical fall with head and face injury.  No lacerations.  She is anticoagulated and thus was made a level 2 trauma by EMS prior to arrival.  She looks very well.  She is awake and alert.  She is following all instructions during my exam and able to provide a full history.  Reevaluation with update and discussion with   ***Considered admission***  Patient's presentation is most consistent with acute presentation with potential threat to life or bodily function.   Disposition:   ____________________________________________  FINAL CLINICAL IMPRESSION(S) / ED DIAGNOSES  Final diagnoses:  None     NEW OUTPATIENT MEDICATIONS STARTED DURING THIS VISIT:  New Prescriptions   No medications on file    Note:  This document was prepared using Dragon voice recognition software and may include unintentional dictation errors.  Fonda Law, MD, [facility]

## 2024-07-01 NOTE — ED Notes (Signed)
 Facility called, discharge papers were not give to them.  Charge RN printed them and had them faxed to facility.  Primary RN will call report.

## 2024-07-04 ENCOUNTER — Ambulatory Visit: Admitting: Podiatry

## 2024-07-05 DIAGNOSIS — R262 Difficulty in walking, not elsewhere classified: Secondary | ICD-10-CM | POA: Diagnosis not present

## 2024-07-05 DIAGNOSIS — W19XXXA Unspecified fall, initial encounter: Secondary | ICD-10-CM | POA: Diagnosis not present

## 2024-07-05 DIAGNOSIS — I499 Cardiac arrhythmia, unspecified: Secondary | ICD-10-CM | POA: Diagnosis not present

## 2024-07-05 DIAGNOSIS — S0083XA Contusion of other part of head, initial encounter: Secondary | ICD-10-CM | POA: Diagnosis not present

## 2024-07-05 DIAGNOSIS — S42392K Other fracture of shaft of left humerus, subsequent encounter for fracture with nonunion: Secondary | ICD-10-CM | POA: Diagnosis not present

## 2024-07-05 DIAGNOSIS — Z7901 Long term (current) use of anticoagulants: Secondary | ICD-10-CM | POA: Diagnosis not present

## 2024-07-05 DIAGNOSIS — S728X2K Other fracture of left femur, subsequent encounter for closed fracture with nonunion: Secondary | ICD-10-CM | POA: Diagnosis not present

## 2024-07-05 DIAGNOSIS — M1990 Unspecified osteoarthritis, unspecified site: Secondary | ICD-10-CM | POA: Diagnosis not present

## 2024-07-06 ENCOUNTER — Encounter

## 2024-07-06 DIAGNOSIS — S42212D Unspecified displaced fracture of surgical neck of left humerus, subsequent encounter for fracture with routine healing: Secondary | ICD-10-CM | POA: Diagnosis not present

## 2024-07-06 DIAGNOSIS — S72142D Displaced intertrochanteric fracture of left femur, subsequent encounter for closed fracture with routine healing: Secondary | ICD-10-CM | POA: Diagnosis not present

## 2024-07-07 ENCOUNTER — Encounter

## 2024-07-07 DIAGNOSIS — Z9181 History of falling: Secondary | ICD-10-CM | POA: Diagnosis not present

## 2024-07-07 DIAGNOSIS — I482 Chronic atrial fibrillation, unspecified: Secondary | ICD-10-CM | POA: Diagnosis not present

## 2024-07-07 DIAGNOSIS — Z7901 Long term (current) use of anticoagulants: Secondary | ICD-10-CM | POA: Diagnosis not present

## 2024-07-07 DIAGNOSIS — K5901 Slow transit constipation: Secondary | ICD-10-CM | POA: Diagnosis not present

## 2024-07-07 DIAGNOSIS — D649 Anemia, unspecified: Secondary | ICD-10-CM | POA: Diagnosis not present

## 2024-07-07 DIAGNOSIS — E119 Type 2 diabetes mellitus without complications: Secondary | ICD-10-CM | POA: Diagnosis not present

## 2024-07-11 DIAGNOSIS — D649 Anemia, unspecified: Secondary | ICD-10-CM | POA: Diagnosis not present

## 2024-07-11 DIAGNOSIS — E119 Type 2 diabetes mellitus without complications: Secondary | ICD-10-CM | POA: Diagnosis not present

## 2024-07-11 DIAGNOSIS — K5901 Slow transit constipation: Secondary | ICD-10-CM | POA: Diagnosis not present

## 2024-07-11 DIAGNOSIS — Z9181 History of falling: Secondary | ICD-10-CM | POA: Diagnosis not present

## 2024-07-14 DIAGNOSIS — I8390 Asymptomatic varicose veins of unspecified lower extremity: Secondary | ICD-10-CM | POA: Diagnosis not present

## 2024-07-14 DIAGNOSIS — S728X2K Other fracture of left femur, subsequent encounter for closed fracture with nonunion: Secondary | ICD-10-CM | POA: Diagnosis not present

## 2024-07-14 DIAGNOSIS — I10 Essential (primary) hypertension: Secondary | ICD-10-CM | POA: Diagnosis not present

## 2024-07-14 DIAGNOSIS — F419 Anxiety disorder, unspecified: Secondary | ICD-10-CM | POA: Diagnosis not present

## 2024-07-14 DIAGNOSIS — S72002D Fracture of unspecified part of neck of left femur, subsequent encounter for closed fracture with routine healing: Secondary | ICD-10-CM | POA: Diagnosis not present

## 2024-07-14 DIAGNOSIS — Z602 Problems related to living alone: Secondary | ICD-10-CM | POA: Diagnosis not present

## 2024-07-14 DIAGNOSIS — S0083XD Contusion of other part of head, subsequent encounter: Secondary | ICD-10-CM | POA: Diagnosis not present

## 2024-07-14 DIAGNOSIS — S42002D Fracture of unspecified part of left clavicle, subsequent encounter for fracture with routine healing: Secondary | ICD-10-CM | POA: Diagnosis not present

## 2024-07-14 DIAGNOSIS — E785 Hyperlipidemia, unspecified: Secondary | ICD-10-CM | POA: Diagnosis not present

## 2024-07-14 DIAGNOSIS — Z8673 Personal history of transient ischemic attack (TIA), and cerebral infarction without residual deficits: Secondary | ICD-10-CM | POA: Diagnosis not present

## 2024-07-14 DIAGNOSIS — E1129 Type 2 diabetes mellitus with other diabetic kidney complication: Secondary | ICD-10-CM | POA: Diagnosis not present

## 2024-07-14 DIAGNOSIS — H9193 Unspecified hearing loss, bilateral: Secondary | ICD-10-CM | POA: Diagnosis not present

## 2024-07-14 DIAGNOSIS — S42202D Unspecified fracture of upper end of left humerus, subsequent encounter for fracture with routine healing: Secondary | ICD-10-CM | POA: Diagnosis not present

## 2024-07-14 DIAGNOSIS — E119 Type 2 diabetes mellitus without complications: Secondary | ICD-10-CM | POA: Diagnosis not present

## 2024-07-14 DIAGNOSIS — I951 Orthostatic hypotension: Secondary | ICD-10-CM | POA: Diagnosis not present

## 2024-07-14 DIAGNOSIS — S42392K Other fracture of shaft of left humerus, subsequent encounter for fracture with nonunion: Secondary | ICD-10-CM | POA: Diagnosis not present

## 2024-07-14 DIAGNOSIS — M170 Bilateral primary osteoarthritis of knee: Secondary | ICD-10-CM | POA: Diagnosis not present

## 2024-07-14 DIAGNOSIS — I440 Atrioventricular block, first degree: Secondary | ICD-10-CM | POA: Diagnosis not present

## 2024-07-14 DIAGNOSIS — S728X1K Other fracture of right femur, subsequent encounter for closed fracture with nonunion: Secondary | ICD-10-CM | POA: Diagnosis not present

## 2024-07-14 DIAGNOSIS — I4891 Unspecified atrial fibrillation: Secondary | ICD-10-CM | POA: Diagnosis not present

## 2024-07-14 DIAGNOSIS — Z9181 History of falling: Secondary | ICD-10-CM | POA: Diagnosis not present

## 2024-07-14 DIAGNOSIS — K219 Gastro-esophageal reflux disease without esophagitis: Secondary | ICD-10-CM | POA: Diagnosis not present

## 2024-07-14 DIAGNOSIS — K222 Esophageal obstruction: Secondary | ICD-10-CM | POA: Diagnosis not present

## 2024-07-14 DIAGNOSIS — M1990 Unspecified osteoarthritis, unspecified site: Secondary | ICD-10-CM | POA: Diagnosis not present

## 2024-07-14 DIAGNOSIS — Z556 Problems related to health literacy: Secondary | ICD-10-CM | POA: Diagnosis not present

## 2024-07-14 DIAGNOSIS — Z7901 Long term (current) use of anticoagulants: Secondary | ICD-10-CM | POA: Diagnosis not present

## 2024-07-19 DIAGNOSIS — I10 Essential (primary) hypertension: Secondary | ICD-10-CM | POA: Diagnosis not present

## 2024-07-19 DIAGNOSIS — S42392K Other fracture of shaft of left humerus, subsequent encounter for fracture with nonunion: Secondary | ICD-10-CM | POA: Diagnosis not present

## 2024-07-19 DIAGNOSIS — S72002D Fracture of unspecified part of neck of left femur, subsequent encounter for closed fracture with routine healing: Secondary | ICD-10-CM | POA: Diagnosis not present

## 2024-07-19 DIAGNOSIS — S728X2K Other fracture of left femur, subsequent encounter for closed fracture with nonunion: Secondary | ICD-10-CM | POA: Diagnosis not present

## 2024-07-19 DIAGNOSIS — S42202D Unspecified fracture of upper end of left humerus, subsequent encounter for fracture with routine healing: Secondary | ICD-10-CM | POA: Diagnosis not present

## 2024-07-19 DIAGNOSIS — S42002D Fracture of unspecified part of left clavicle, subsequent encounter for fracture with routine healing: Secondary | ICD-10-CM | POA: Diagnosis not present

## 2024-07-20 DIAGNOSIS — I10 Essential (primary) hypertension: Secondary | ICD-10-CM | POA: Diagnosis not present

## 2024-07-20 DIAGNOSIS — S42002D Fracture of unspecified part of left clavicle, subsequent encounter for fracture with routine healing: Secondary | ICD-10-CM | POA: Diagnosis not present

## 2024-07-20 DIAGNOSIS — S42392K Other fracture of shaft of left humerus, subsequent encounter for fracture with nonunion: Secondary | ICD-10-CM | POA: Diagnosis not present

## 2024-07-20 DIAGNOSIS — S728X2K Other fracture of left femur, subsequent encounter for closed fracture with nonunion: Secondary | ICD-10-CM | POA: Diagnosis not present

## 2024-07-20 DIAGNOSIS — S72002D Fracture of unspecified part of neck of left femur, subsequent encounter for closed fracture with routine healing: Secondary | ICD-10-CM | POA: Diagnosis not present

## 2024-07-20 DIAGNOSIS — S42202D Unspecified fracture of upper end of left humerus, subsequent encounter for fracture with routine healing: Secondary | ICD-10-CM | POA: Diagnosis not present

## 2024-07-21 ENCOUNTER — Encounter: Payer: Self-pay | Admitting: Podiatry

## 2024-07-21 ENCOUNTER — Ambulatory Visit (INDEPENDENT_AMBULATORY_CARE_PROVIDER_SITE_OTHER): Admitting: Podiatry

## 2024-07-21 DIAGNOSIS — N1831 Chronic kidney disease, stage 3a: Secondary | ICD-10-CM

## 2024-07-21 DIAGNOSIS — E1165 Type 2 diabetes mellitus with hyperglycemia: Secondary | ICD-10-CM | POA: Diagnosis not present

## 2024-07-21 DIAGNOSIS — M79675 Pain in left toe(s): Secondary | ICD-10-CM | POA: Diagnosis not present

## 2024-07-21 DIAGNOSIS — S42202D Unspecified fracture of upper end of left humerus, subsequent encounter for fracture with routine healing: Secondary | ICD-10-CM | POA: Diagnosis not present

## 2024-07-21 DIAGNOSIS — S42392K Other fracture of shaft of left humerus, subsequent encounter for fracture with nonunion: Secondary | ICD-10-CM | POA: Diagnosis not present

## 2024-07-21 DIAGNOSIS — S728X2K Other fracture of left femur, subsequent encounter for closed fracture with nonunion: Secondary | ICD-10-CM | POA: Diagnosis not present

## 2024-07-21 DIAGNOSIS — M79674 Pain in right toe(s): Secondary | ICD-10-CM

## 2024-07-21 DIAGNOSIS — S72002D Fracture of unspecified part of neck of left femur, subsequent encounter for closed fracture with routine healing: Secondary | ICD-10-CM | POA: Diagnosis not present

## 2024-07-21 DIAGNOSIS — I10 Essential (primary) hypertension: Secondary | ICD-10-CM | POA: Diagnosis not present

## 2024-07-21 DIAGNOSIS — D689 Coagulation defect, unspecified: Secondary | ICD-10-CM

## 2024-07-21 DIAGNOSIS — S42002D Fracture of unspecified part of left clavicle, subsequent encounter for fracture with routine healing: Secondary | ICD-10-CM | POA: Diagnosis not present

## 2024-07-21 DIAGNOSIS — B351 Tinea unguium: Secondary | ICD-10-CM

## 2024-07-21 NOTE — Progress Notes (Signed)
 This patient presents to my office for at risk foot care.  This patient requires this care by a professional since this patient will be at risk due to having CKD, DM and coagulation defect.  This patient is unable to cut nails herself since the patient cannot reach her nails.These nails are painful walking and wearing shoes.  This patient presents for at risk foot care today. Patient presents in a wheelchair for treatment.  General Appearance  Alert, conversant and in no acute stress.  Vascular  Dorsalis pedis and posterior tibial  pulses are  weakly palpable  bilaterally.  Capillary return is within normal limits  bilaterally. Temperature is within normal limits  bilaterally.  Neurologic  Senn-Weinstein monofilament wire test within normal limits  bilaterally. Muscle power within normal limits bilaterally.  Nails Thick disfigured discolored nails with subungual debris  from hallux to fifth toes bilaterally. No evidence of bacterial infection or drainage bilaterally.  Orthopedic  No limitations of motion  feet .  No crepitus or effusions noted.  No bony pathology or digital deformities noted.  Skin  normotropic skin with no porokeratosis noted bilaterally.  No signs of infections or ulcers noted.     Onychomycosis  Pain in right toes  Pain in left toes  Consent was obtained for treatment procedures.   Mechanical debridement of nails 1-5  bilaterally performed with a nail nipper.  Filed with dremel without incident.    Return office visit    prn                 Told patient to return for periodic foot care and evaluation due to potential at risk complications.   Cordella Bold DPM

## 2024-07-25 DIAGNOSIS — Z23 Encounter for immunization: Secondary | ICD-10-CM | POA: Diagnosis not present

## 2024-07-25 DIAGNOSIS — Z8781 Personal history of (healed) traumatic fracture: Secondary | ICD-10-CM | POA: Diagnosis not present

## 2024-07-25 DIAGNOSIS — E7800 Pure hypercholesterolemia, unspecified: Secondary | ICD-10-CM | POA: Diagnosis not present

## 2024-07-25 DIAGNOSIS — E119 Type 2 diabetes mellitus without complications: Secondary | ICD-10-CM | POA: Diagnosis not present

## 2024-07-25 DIAGNOSIS — I4811 Longstanding persistent atrial fibrillation: Secondary | ICD-10-CM | POA: Diagnosis not present

## 2024-07-25 DIAGNOSIS — F419 Anxiety disorder, unspecified: Secondary | ICD-10-CM | POA: Diagnosis not present

## 2024-07-25 DIAGNOSIS — M7918 Myalgia, other site: Secondary | ICD-10-CM | POA: Diagnosis not present

## 2024-07-25 DIAGNOSIS — I679 Cerebrovascular disease, unspecified: Secondary | ICD-10-CM | POA: Diagnosis not present

## 2024-07-25 DIAGNOSIS — D62 Acute posthemorrhagic anemia: Secondary | ICD-10-CM | POA: Diagnosis not present

## 2024-07-25 DIAGNOSIS — I10 Essential (primary) hypertension: Secondary | ICD-10-CM | POA: Diagnosis not present

## 2024-07-26 ENCOUNTER — Encounter (INDEPENDENT_AMBULATORY_CARE_PROVIDER_SITE_OTHER): Admitting: Ophthalmology

## 2024-07-26 DIAGNOSIS — H353132 Nonexudative age-related macular degeneration, bilateral, intermediate dry stage: Secondary | ICD-10-CM

## 2024-07-26 DIAGNOSIS — H35033 Hypertensive retinopathy, bilateral: Secondary | ICD-10-CM | POA: Diagnosis not present

## 2024-07-26 DIAGNOSIS — H26492 Other secondary cataract, left eye: Secondary | ICD-10-CM

## 2024-07-26 DIAGNOSIS — I10 Essential (primary) hypertension: Secondary | ICD-10-CM

## 2024-07-26 DIAGNOSIS — H43813 Vitreous degeneration, bilateral: Secondary | ICD-10-CM

## 2024-07-26 DIAGNOSIS — H33302 Unspecified retinal break, left eye: Secondary | ICD-10-CM | POA: Diagnosis not present

## 2024-07-27 DIAGNOSIS — I10 Essential (primary) hypertension: Secondary | ICD-10-CM | POA: Diagnosis not present

## 2024-07-27 DIAGNOSIS — S42002D Fracture of unspecified part of left clavicle, subsequent encounter for fracture with routine healing: Secondary | ICD-10-CM | POA: Diagnosis not present

## 2024-07-27 DIAGNOSIS — S42202D Unspecified fracture of upper end of left humerus, subsequent encounter for fracture with routine healing: Secondary | ICD-10-CM | POA: Diagnosis not present

## 2024-07-27 DIAGNOSIS — S42392K Other fracture of shaft of left humerus, subsequent encounter for fracture with nonunion: Secondary | ICD-10-CM | POA: Diagnosis not present

## 2024-07-27 DIAGNOSIS — S728X2K Other fracture of left femur, subsequent encounter for closed fracture with nonunion: Secondary | ICD-10-CM | POA: Diagnosis not present

## 2024-07-27 DIAGNOSIS — S72002D Fracture of unspecified part of neck of left femur, subsequent encounter for closed fracture with routine healing: Secondary | ICD-10-CM | POA: Diagnosis not present

## 2024-07-28 DIAGNOSIS — S42002D Fracture of unspecified part of left clavicle, subsequent encounter for fracture with routine healing: Secondary | ICD-10-CM | POA: Diagnosis not present

## 2024-07-28 DIAGNOSIS — S728X2K Other fracture of left femur, subsequent encounter for closed fracture with nonunion: Secondary | ICD-10-CM | POA: Diagnosis not present

## 2024-07-28 DIAGNOSIS — S72002D Fracture of unspecified part of neck of left femur, subsequent encounter for closed fracture with routine healing: Secondary | ICD-10-CM | POA: Diagnosis not present

## 2024-07-28 DIAGNOSIS — I10 Essential (primary) hypertension: Secondary | ICD-10-CM | POA: Diagnosis not present

## 2024-07-28 DIAGNOSIS — S42392K Other fracture of shaft of left humerus, subsequent encounter for fracture with nonunion: Secondary | ICD-10-CM | POA: Diagnosis not present

## 2024-07-28 DIAGNOSIS — S42202D Unspecified fracture of upper end of left humerus, subsequent encounter for fracture with routine healing: Secondary | ICD-10-CM | POA: Diagnosis not present

## 2024-08-03 DIAGNOSIS — S42392K Other fracture of shaft of left humerus, subsequent encounter for fracture with nonunion: Secondary | ICD-10-CM | POA: Diagnosis not present

## 2024-08-03 DIAGNOSIS — S42002D Fracture of unspecified part of left clavicle, subsequent encounter for fracture with routine healing: Secondary | ICD-10-CM | POA: Diagnosis not present

## 2024-08-03 DIAGNOSIS — S728X2K Other fracture of left femur, subsequent encounter for closed fracture with nonunion: Secondary | ICD-10-CM | POA: Diagnosis not present

## 2024-08-03 DIAGNOSIS — I10 Essential (primary) hypertension: Secondary | ICD-10-CM | POA: Diagnosis not present

## 2024-08-03 DIAGNOSIS — S72002D Fracture of unspecified part of neck of left femur, subsequent encounter for closed fracture with routine healing: Secondary | ICD-10-CM | POA: Diagnosis not present

## 2024-08-03 DIAGNOSIS — M1712 Unilateral primary osteoarthritis, left knee: Secondary | ICD-10-CM | POA: Diagnosis not present

## 2024-08-03 DIAGNOSIS — S42202D Unspecified fracture of upper end of left humerus, subsequent encounter for fracture with routine healing: Secondary | ICD-10-CM | POA: Diagnosis not present

## 2024-08-04 ENCOUNTER — Other Ambulatory Visit: Payer: Self-pay

## 2024-08-05 DIAGNOSIS — S728X2K Other fracture of left femur, subsequent encounter for closed fracture with nonunion: Secondary | ICD-10-CM | POA: Diagnosis not present

## 2024-08-05 DIAGNOSIS — S42202D Unspecified fracture of upper end of left humerus, subsequent encounter for fracture with routine healing: Secondary | ICD-10-CM | POA: Diagnosis not present

## 2024-08-05 DIAGNOSIS — S72002D Fracture of unspecified part of neck of left femur, subsequent encounter for closed fracture with routine healing: Secondary | ICD-10-CM | POA: Diagnosis not present

## 2024-08-05 DIAGNOSIS — I10 Essential (primary) hypertension: Secondary | ICD-10-CM | POA: Diagnosis not present

## 2024-08-05 DIAGNOSIS — S42002D Fracture of unspecified part of left clavicle, subsequent encounter for fracture with routine healing: Secondary | ICD-10-CM | POA: Diagnosis not present

## 2024-08-05 DIAGNOSIS — S42392K Other fracture of shaft of left humerus, subsequent encounter for fracture with nonunion: Secondary | ICD-10-CM | POA: Diagnosis not present

## 2024-08-05 MED ORDER — ATENOLOL 25 MG PO TABS: 25.0000 mg | ORAL_TABLET | Freq: Every day | ORAL | 0 refills | Status: DC

## 2024-08-06 ENCOUNTER — Encounter

## 2024-08-08 ENCOUNTER — Encounter

## 2024-08-09 DIAGNOSIS — S42392K Other fracture of shaft of left humerus, subsequent encounter for fracture with nonunion: Secondary | ICD-10-CM | POA: Diagnosis not present

## 2024-08-09 DIAGNOSIS — S42002D Fracture of unspecified part of left clavicle, subsequent encounter for fracture with routine healing: Secondary | ICD-10-CM | POA: Diagnosis not present

## 2024-08-09 DIAGNOSIS — S728X2K Other fracture of left femur, subsequent encounter for closed fracture with nonunion: Secondary | ICD-10-CM | POA: Diagnosis not present

## 2024-08-09 DIAGNOSIS — S72002D Fracture of unspecified part of neck of left femur, subsequent encounter for closed fracture with routine healing: Secondary | ICD-10-CM | POA: Diagnosis not present

## 2024-08-09 DIAGNOSIS — S42202D Unspecified fracture of upper end of left humerus, subsequent encounter for fracture with routine healing: Secondary | ICD-10-CM | POA: Diagnosis not present

## 2024-08-09 DIAGNOSIS — I10 Essential (primary) hypertension: Secondary | ICD-10-CM | POA: Diagnosis not present

## 2024-08-22 ENCOUNTER — Encounter (INDEPENDENT_AMBULATORY_CARE_PROVIDER_SITE_OTHER): Admitting: Ophthalmology

## 2024-08-30 DIAGNOSIS — M1712 Unilateral primary osteoarthritis, left knee: Secondary | ICD-10-CM | POA: Diagnosis not present

## 2024-09-06 ENCOUNTER — Encounter

## 2024-10-07 ENCOUNTER — Encounter

## 2024-10-12 ENCOUNTER — Encounter (HOSPITAL_BASED_OUTPATIENT_CLINIC_OR_DEPARTMENT_OTHER): Payer: Self-pay

## 2024-10-12 ENCOUNTER — Telehealth: Payer: Self-pay | Admitting: Cardiology

## 2024-10-12 ENCOUNTER — Emergency Department (HOSPITAL_BASED_OUTPATIENT_CLINIC_OR_DEPARTMENT_OTHER)

## 2024-10-12 ENCOUNTER — Inpatient Hospital Stay (HOSPITAL_BASED_OUTPATIENT_CLINIC_OR_DEPARTMENT_OTHER)
Admission: EM | Admit: 2024-10-12 | Discharge: 2024-10-15 | DRG: 291 | Disposition: A | Discharge status: Hospice | Attending: Family Medicine | Admitting: Family Medicine

## 2024-10-12 ENCOUNTER — Other Ambulatory Visit: Payer: Self-pay

## 2024-10-12 DIAGNOSIS — K219 Gastro-esophageal reflux disease without esophagitis: Secondary | ICD-10-CM | POA: Diagnosis present

## 2024-10-12 DIAGNOSIS — I480 Paroxysmal atrial fibrillation: Principal | ICD-10-CM | POA: Diagnosis present

## 2024-10-12 DIAGNOSIS — E1122 Type 2 diabetes mellitus with diabetic chronic kidney disease: Secondary | ICD-10-CM | POA: Diagnosis present

## 2024-10-12 DIAGNOSIS — I440 Atrioventricular block, first degree: Secondary | ICD-10-CM | POA: Diagnosis present

## 2024-10-12 DIAGNOSIS — N1831 Chronic kidney disease, stage 3a: Secondary | ICD-10-CM | POA: Diagnosis present

## 2024-10-12 DIAGNOSIS — I160 Hypertensive urgency: Secondary | ICD-10-CM | POA: Diagnosis present

## 2024-10-12 DIAGNOSIS — Z1152 Encounter for screening for COVID-19: Secondary | ICD-10-CM

## 2024-10-12 DIAGNOSIS — Z515 Encounter for palliative care: Secondary | ICD-10-CM | POA: Diagnosis not present

## 2024-10-12 DIAGNOSIS — J810 Acute pulmonary edema: Secondary | ICD-10-CM

## 2024-10-12 DIAGNOSIS — I130 Hypertensive heart and chronic kidney disease with heart failure and stage 1 through stage 4 chronic kidney disease, or unspecified chronic kidney disease: Principal | ICD-10-CM | POA: Diagnosis present

## 2024-10-12 DIAGNOSIS — I4891 Unspecified atrial fibrillation: Secondary | ICD-10-CM

## 2024-10-12 DIAGNOSIS — J9601 Acute respiratory failure with hypoxia: Secondary | ICD-10-CM | POA: Diagnosis present

## 2024-10-12 DIAGNOSIS — Z79899 Other long term (current) drug therapy: Secondary | ICD-10-CM | POA: Diagnosis not present

## 2024-10-12 DIAGNOSIS — Z888 Allergy status to other drugs, medicaments and biological substances status: Secondary | ICD-10-CM

## 2024-10-12 DIAGNOSIS — G9340 Encephalopathy, unspecified: Secondary | ICD-10-CM | POA: Diagnosis present

## 2024-10-12 DIAGNOSIS — Z9102 Food additives allergy status: Secondary | ICD-10-CM

## 2024-10-12 DIAGNOSIS — M7989 Other specified soft tissue disorders: Secondary | ICD-10-CM | POA: Insufficient documentation

## 2024-10-12 DIAGNOSIS — I5033 Acute on chronic diastolic (congestive) heart failure: Secondary | ICD-10-CM | POA: Diagnosis present

## 2024-10-12 DIAGNOSIS — E785 Hyperlipidemia, unspecified: Secondary | ICD-10-CM | POA: Diagnosis present

## 2024-10-12 DIAGNOSIS — Z8673 Personal history of transient ischemic attack (TIA), and cerebral infarction without residual deficits: Secondary | ICD-10-CM | POA: Diagnosis not present

## 2024-10-12 DIAGNOSIS — F0390 Unspecified dementia without behavioral disturbance: Secondary | ICD-10-CM | POA: Diagnosis present

## 2024-10-12 DIAGNOSIS — Z950 Presence of cardiac pacemaker: Secondary | ICD-10-CM

## 2024-10-12 DIAGNOSIS — Z7901 Long term (current) use of anticoagulants: Secondary | ICD-10-CM | POA: Diagnosis not present

## 2024-10-12 DIAGNOSIS — I509 Heart failure, unspecified: Secondary | ICD-10-CM

## 2024-10-12 DIAGNOSIS — Z66 Do not resuscitate: Secondary | ICD-10-CM | POA: Diagnosis present

## 2024-10-12 DIAGNOSIS — Z96651 Presence of right artificial knee joint: Secondary | ICD-10-CM | POA: Diagnosis present

## 2024-10-12 DIAGNOSIS — E1169 Type 2 diabetes mellitus with other specified complication: Secondary | ICD-10-CM | POA: Diagnosis present

## 2024-10-12 LAB — RESP PANEL BY RT-PCR (RSV, FLU A&B, COVID)  RVPGX2
Influenza A by PCR: NEGATIVE
Influenza B by PCR: NEGATIVE
Resp Syncytial Virus by PCR: NEGATIVE
SARS Coronavirus 2 by RT PCR: NEGATIVE

## 2024-10-12 LAB — COMPREHENSIVE METABOLIC PANEL WITH GFR
ALT: 7 U/L (ref 0–44)
AST: 19 U/L (ref 15–41)
Albumin: 4.1 g/dL (ref 3.5–5.0)
Alkaline Phosphatase: 99 U/L (ref 38–126)
Anion gap: 12 (ref 5–15)
BUN: 19 mg/dL (ref 8–23)
CO2: 28 mmol/L (ref 22–32)
Calcium: 9.7 mg/dL (ref 8.9–10.3)
Chloride: 97 mmol/L — ABNORMAL LOW (ref 98–111)
Creatinine, Ser: 0.96 mg/dL (ref 0.44–1.00)
GFR, Estimated: 56 mL/min — ABNORMAL LOW
Glucose, Bld: 145 mg/dL — ABNORMAL HIGH (ref 70–99)
Potassium: 4.2 mmol/L (ref 3.5–5.1)
Sodium: 136 mmol/L (ref 135–145)
Total Bilirubin: 1.1 mg/dL (ref 0.0–1.2)
Total Protein: 6.6 g/dL (ref 6.5–8.1)

## 2024-10-12 LAB — BASIC METABOLIC PANEL WITH GFR
Anion gap: 12 (ref 5–15)
BUN: 17 mg/dL (ref 8–23)
CO2: 26 mmol/L (ref 22–32)
Calcium: 9.6 mg/dL (ref 8.9–10.3)
Chloride: 99 mmol/L (ref 98–111)
Creatinine, Ser: 0.86 mg/dL (ref 0.44–1.00)
GFR, Estimated: >60 mL/min
Glucose, Bld: 104 mg/dL — ABNORMAL HIGH (ref 70–99)
Potassium: 5.2 mmol/L — ABNORMAL HIGH (ref 3.5–5.1)
Sodium: 136 mmol/L (ref 135–145)

## 2024-10-12 LAB — URINALYSIS, ROUTINE W REFLEX MICROSCOPIC
Bilirubin Urine: NEGATIVE
Glucose, UA: NEGATIVE mg/dL
Hgb urine dipstick: NEGATIVE
Ketones, ur: NEGATIVE mg/dL
Leukocytes,Ua: NEGATIVE
Nitrite: NEGATIVE
Protein, ur: NEGATIVE mg/dL
Specific Gravity, Urine: 1.005 (ref 1.005–1.030)
pH: 7 (ref 5.0–8.0)

## 2024-10-12 LAB — I-STAT VENOUS BLOOD GAS, ED
Acid-Base Excess: 4 mmol/L — ABNORMAL HIGH (ref 0.0–2.0)
Bicarbonate: 30.9 mmol/L — ABNORMAL HIGH (ref 20.0–28.0)
Calcium, Ion: 1.14 mmol/L — ABNORMAL LOW (ref 1.15–1.40)
HCT: 37 % (ref 36.0–46.0)
Hemoglobin: 12.6 g/dL (ref 12.0–15.0)
O2 Saturation: 55 %
Patient temperature: 98.1
Potassium: 4.4 mmol/L (ref 3.5–5.1)
Sodium: 135 mmol/L (ref 135–145)
TCO2: 33 mmol/L — ABNORMAL HIGH (ref 22–32)
pCO2, Ven: 55.1 mmHg (ref 44–60)
pH, Ven: 7.356 (ref 7.25–7.43)
pO2, Ven: 31 mmHg — CL (ref 32–45)

## 2024-10-12 LAB — CBC WITH DIFFERENTIAL/PLATELET
Abs Immature Granulocytes: 0.02 10*3/uL (ref 0.00–0.07)
Basophils Absolute: 0 10*3/uL (ref 0.0–0.1)
Basophils Relative: 1 %
Eosinophils Absolute: 0 10*3/uL (ref 0.0–0.5)
Eosinophils Relative: 0 %
HCT: 31.6 % — ABNORMAL LOW (ref 36.0–46.0)
Hemoglobin: 10.4 g/dL — ABNORMAL LOW (ref 12.0–15.0)
Immature Granulocytes: 0 %
Lymphocytes Relative: 16 %
Lymphs Abs: 0.9 10*3/uL (ref 0.7–4.0)
MCH: 29.5 pg (ref 26.0–34.0)
MCHC: 32.9 g/dL (ref 30.0–36.0)
MCV: 89.5 fL (ref 80.0–100.0)
Monocytes Absolute: 0.5 10*3/uL (ref 0.1–1.0)
Monocytes Relative: 9 %
Neutro Abs: 4.3 10*3/uL (ref 1.7–7.7)
Neutrophils Relative %: 74 %
Platelets: 188 10*3/uL (ref 150–400)
RBC: 3.53 MIL/uL — ABNORMAL LOW (ref 3.87–5.11)
RDW: 14.6 % (ref 11.5–15.5)
WBC: 5.7 10*3/uL (ref 4.0–10.5)
nRBC: 0 % (ref 0.0–0.2)

## 2024-10-12 LAB — TROPONIN T, HIGH SENSITIVITY
Troponin T High Sensitivity: 14 ng/L (ref 0–19)
Troponin T High Sensitivity: 15 ng/L (ref 0–19)

## 2024-10-12 LAB — PRO BRAIN NATRIURETIC PEPTIDE: Pro Brain Natriuretic Peptide: 6813 pg/mL — ABNORMAL HIGH

## 2024-10-12 MED ORDER — FUROSEMIDE 10 MG/ML IJ SOLN
40.0000 mg | Freq: Once | INTRAMUSCULAR | Status: AC
  Administered 2024-10-12: 40 mg via INTRAVENOUS
  Filled 2024-10-12: qty 4

## 2024-10-12 MED ORDER — NITROGLYCERIN 0.4 MG SL SUBL
0.4000 mg | SUBLINGUAL_TABLET | SUBLINGUAL | Status: DC | PRN
  Administered 2024-10-12: 0.4 mg via SUBLINGUAL
  Filled 2024-10-12: qty 1

## 2024-10-12 MED ORDER — OLANZAPINE 10 MG IM SOLR
5.0000 mg | Freq: Once | INTRAMUSCULAR | Status: DC | PRN
  Filled 2024-10-12: qty 10

## 2024-10-12 MED ORDER — MORPHINE SULFATE (PF) 2 MG/ML IV SOLN
2.0000 mg | Freq: Once | INTRAVENOUS | Status: AC
  Administered 2024-10-12: 2 mg via INTRAVENOUS
  Filled 2024-10-12: qty 1

## 2024-10-12 MED ORDER — DILTIAZEM HCL 25 MG/5ML IV SOLN
10.0000 mg | Freq: Once | INTRAVENOUS | Status: DC
  Filled 2024-10-12: qty 5

## 2024-10-12 MED ORDER — NITROGLYCERIN IN D5W 200-5 MCG/ML-% IV SOLN
0.0000 ug/min | INTRAVENOUS | Status: DC
  Administered 2024-10-12: 5 ug/min via INTRAVENOUS
  Filled 2024-10-12: qty 250

## 2024-10-12 MED ORDER — FUROSEMIDE 10 MG/ML IJ SOLN
40.0000 mg | Freq: Two times a day (BID) | INTRAMUSCULAR | Status: DC
  Administered 2024-10-13 – 2024-10-14 (×5): 40 mg via INTRAVENOUS
  Filled 2024-10-12 (×5): qty 4

## 2024-10-12 MED ORDER — LORAZEPAM 2 MG/ML IJ SOLN
0.5000 mg | Freq: Once | INTRAMUSCULAR | Status: AC
  Administered 2024-10-12: 0.5 mg via INTRAVENOUS
  Filled 2024-10-12: qty 1

## 2024-10-12 MED ORDER — ONDANSETRON HCL 4 MG PO TABS: 4.0000 mg | ORAL_TABLET | Freq: Four times a day (QID) | ORAL | Status: DC | PRN

## 2024-10-12 MED ORDER — ONDANSETRON HCL 4 MG/2ML IJ SOLN
4.0000 mg | Freq: Once | INTRAMUSCULAR | Status: AC
  Administered 2024-10-12: 4 mg via INTRAVENOUS
  Filled 2024-10-12: qty 2

## 2024-10-12 MED ORDER — HYDRALAZINE HCL 20 MG/ML IJ SOLN
10.0000 mg | INTRAMUSCULAR | Status: DC | PRN
  Administered 2024-10-12: 10 mg via INTRAVENOUS
  Filled 2024-10-12: qty 1

## 2024-10-12 MED ORDER — METOPROLOL TARTRATE 5 MG/5ML IV SOLN
5.0000 mg | Freq: Once | INTRAVENOUS | Status: AC
  Administered 2024-10-12: 5 mg via INTRAVENOUS
  Filled 2024-10-12: qty 5

## 2024-10-12 MED ORDER — ONDANSETRON HCL 4 MG/2ML IJ SOLN: 4.0000 mg | Freq: Four times a day (QID) | INTRAMUSCULAR | Status: DC | PRN

## 2024-10-12 NOTE — Assessment & Plan Note (Addendum)
 Acute respiratory failure now requiring BiPAP in the setting of volume overload,?  Flash pulmonary edema, hypertensive urgency   Chest x-ray with cardiomegaly and pulmonary edema proBNP in the 6800s  BiPAP in place Will add on VBG to correlate Will add on D-dimer x 1, though VTE less likely in setting of regular eliquis  use  Monitor resp status with diuresis Follow closely

## 2024-10-12 NOTE — Assessment & Plan Note (Signed)
 Noted bilateral lower extremity swelling in the setting of volume overload with likely etiology of heart failure Lower extremity ultrasound negative for DVT Some question of right lower extremity ?swelling warm to touch on nursing exam White count within normal limits Will otherwise monitor for now Low threshold for infectious coverage if there is any clinical decompensation

## 2024-10-12 NOTE — ED Provider Notes (Signed)
 " [place] EMERGENCY DEPARTMENT AT [facility] Provider Note   CSN: [id number] Arrival date & time: 10/12/24  1217     Patient presents with: Shortness of Breath   Rachel Hines is a 89 y.o. female.   Pt is a 89 yo female with pmhx significant for afib (on eliquis ), GERD, arthritis, DM2, HLD, and HTN.  Pt has had sob for the past few days, but it was worse last night.  She can't walk without getting SOB.  She noticed leg swelling (R>L) yesterday.  She has been compliant with her Eliquis .       Prior to Admission medications  Medication Sig Start Date End Date Taking? Authorizing Provider  acetaminophen  (TYLENOL ) 500 MG tablet Take 2 tablets (1,000 mg total) by mouth every 8 (eight) hours as needed for mild pain (pain score 1-3) or moderate pain (pain score 4-6). 05/26/24   Deward Eck, PA-C  apixaban  (ELIQUIS ) 2.5 MG TABS tablet Take 1 tablet (2.5 mg total) by mouth 2 (two) times daily. 05/29/24   Drusilla Sabas RAMAN, MD  atenolol  (TENORMIN ) 25 MG tablet Take 1 tablet (25 mg total) by mouth daily. 08/05/24   Waddell Danelle ORN, MD  cholecalciferol  (CHOLECALCIFEROL ) 25 MCG tablet Take 2 tablets (2,000 Units total) by mouth 2 (two) times daily. 05/26/24   Deward Eck, PA-C  oxyCODONE  (OXY IR/ROXICODONE ) 5 MG immediate release tablet Take 0.5-1 tablets (2.5-5 mg total) by mouth every 4 (four) hours as needed for moderate pain (pain score 4-6) or severe pain (pain score 7-10). 05/26/24   Deward Eck, PA-C  polyethylene glycol (MIRALAX  / GLYCOLAX ) 17 g packet Take 17 g by mouth daily as needed. 05/29/24   Drusilla Sabas RAMAN, MD  rosuvastatin  (CRESTOR ) 5 MG tablet TAKE 1 TABLET BY MOUTH DAILY Patient taking differently: Take 5 mg by mouth in the morning. 09/15/23   Waddell Danelle ORN, MD  Vitamin D , Ergocalciferol , (DRISDOL ) 1.25 MG (50000 UNIT) CAPS capsule Take 1 capsule (50,000 Units total) by mouth every 7 (seven) days. 05/31/24   Deward Eck, PA-C  zolpidem  (AMBIEN ) 10 MG tablet Take 5 mg by mouth  at bedtime.    [provider]    Allergies: Atorvastatin and Other    Review of Systems  Respiratory:  Positive for shortness of breath.   Cardiovascular:  Positive for leg swelling.  All other systems reviewed and are negative.   Updated Vital Signs BP (!) 144/78   Pulse 82   Temp 97.9 F (36.6 C)   Resp (!) 27   Ht 5' 3 (1.6 m)   Wt 65.8 kg   SpO2 98%   BMI 25.70 kg/m   Physical Exam Vitals and nursing note reviewed.  Constitutional:      Appearance: She is well-developed.  HENT:     Head: Normocephalic and atraumatic.     Mouth/Throat:     Mouth: Mucous membranes are moist.     Pharynx: Oropharynx is clear.  Eyes:     Extraocular Movements: Extraocular movements intact.     Pupils: Pupils are equal, round, and reactive to light.  Cardiovascular:     Rate and Rhythm: Tachycardia present. Rhythm irregular.  Pulmonary:     Effort: Pulmonary effort is normal.     Breath sounds: Normal breath sounds.  Abdominal:     General: Bowel sounds are normal.     Palpations: Abdomen is soft.  Musculoskeletal:     Cervical back: Normal range of motion and neck supple.  Right lower leg: Edema present.  Skin:    General: Skin is warm and dry.     Capillary Refill: Capillary refill takes less than 2 seconds.  Neurological:     General: No focal deficit present.     Mental Status: She is alert and oriented to person, place, and time.  Psychiatric:        Mood and Affect: Mood normal.        Behavior: Behavior normal.     (all labs ordered are listed, but only abnormal results are displayed) Labs Reviewed  BASIC METABOLIC PANEL WITH GFR - Abnormal; Notable for the following components:      Result Value   Potassium 5.2 (*)    Glucose, Bld 104 (*)    All other components within normal limits  CBC WITH DIFFERENTIAL/PLATELET - Abnormal; Notable for the following components:   RBC 3.53 (*)    Hemoglobin 10.4 (*)    HCT 31.6 (*)    All other components  within normal limits  PRO BRAIN NATRIURETIC PEPTIDE - Abnormal; Notable for the following components:   Pro Brain Natriuretic Peptide 6,813.0 (*)    All other components within normal limits  RESP PANEL BY RT-PCR (RSV, FLU A&B, COVID)  RVPGX2  URINALYSIS, ROUTINE W REFLEX MICROSCOPIC  TROPONIN T, HIGH SENSITIVITY  TROPONIN T, HIGH SENSITIVITY    EKG: None  Radiology: [facility] Chest Port 1 View Result Date: 10/12/2024 CLINICAL DATA:  Shortness of breath. EXAM: PORTABLE CHEST 1 VIEW COMPARISON:  Chest radiograph dated 07/01/2024. FINDINGS: Cardiomegaly with vascular congestion and small bilateral pleural effusions. No pneumothorax. Loop recorder device. No acute osseous pathology. Osteopenia with degenerative changes. IMPRESSION: Cardiomegaly with vascular congestion and small bilateral pleural effusions. Electronically Signed   By: Vanetta Chou M.D.   On: 10/12/2024 14:04     Procedures   Medications Ordered in the ED  nitroGLYCERIN  (NITROSTAT ) SL tablet 0.4 mg (0.4 mg Sublingual Given 10/12/24 1540)  furosemide  (LASIX ) injection 40 mg (40 mg Intravenous Given 10/12/24 1437)  metoprolol  tartrate (LOPRESSOR ) injection 5 mg (5 mg Intravenous Given 10/12/24 1540)  ondansetron  (ZOFRAN ) injection 4 mg (4 mg Intravenous Given 10/12/24 1542)                                    Medical Decision Making Amount and/or Complexity of Data Reviewed Labs: ordered. Radiology: ordered.  Risk Prescription drug management.   This patient presents to the ED for concern of sob, this involves an extensive number of treatment options, and is a complaint that carries with it a high risk of complications and morbidity.  The differential diagnosis includes afib with rvr, chf, copd, covid/flu/rsv, pna, PE   Co morbidities that complicate the patient evaluation  afib (on eliquis ), GERD, arthritis, DM2, HLD, and HTN   Additional history obtained:  Additional history obtained from epic chart  review External records from outside source obtained and reviewed including family   Lab Tests:  I Ordered, and personally interpreted labs.  The pertinent results include:  cbc with hgb 10.4 (7.9 on 9/15); bmp with k sl elevated at 5.2; covid/flu/rsv neg; trop nl; BNP elevated at 6813   Imaging Studies ordered:  I ordered imaging studies including cxr  I independently visualized and interpreted imaging which showed  Cardiomegaly with vascular congestion and small bilateral pleural  effusions.   I agree with the radiologist interpretation   Cardiac Monitoring:  The patient was maintained on a cardiac monitor.  I personally viewed and interpreted the cardiac monitored which showed an underlying rhythm of: afib   Medicines ordered and prescription drug management:  I ordered medication including lasix , lopressor , zofran , nitro  for sx  Reevaluation of the patient after these medicines showed that the patient improved I have reviewed the patients home medicines and have made adjustments as needed   Test Considered:  ct   Critical Interventions:  us    Problem List / ED Course:  New onset CHF:  pt given lasix .  She is on 2L oxygen for comfort.   Afib:  pt is tachy, so she's given 5 mg lopressor  iv R leg swelling:  US  pending   Reevaluation:  After the interventions noted above, I reevaluated the patient and found that they have :improved   Social Determinants of Health:  Lives at home   Dispostion:  After consideration of the diagnostic results and the patients response to treatment, I feel that the patent would benefit from admission.       Final diagnoses:  Paroxysmal atrial fibrillation (HCC)  Acute on chronic congestive heart failure, unspecified heart failure type [facility])    ED Discharge Orders     None          Dean Clarity, MD 10/12/24 1545  "

## 2024-10-12 NOTE — Assessment & Plan Note (Signed)
 Cr 0.86 today w/ GFR>60  Monitor

## 2024-10-12 NOTE — Assessment & Plan Note (Addendum)
 With noted progressive confusion and agitation in the setting of acute respiratory failure with hypoxia Actively agitated on BiPAP Pending as needed Ativan  for agitation in the ER Add on Zyprexa  in the interim-advance to Haldol as appropriate Will check VBG to correlate No active infection noted at present CT head is appropriate Check ammonia level Suspect change, remote history of CVA and likely subclinical dementia as confounding issues

## 2024-10-12 NOTE — Assessment & Plan Note (Signed)
 Blood sugars in low 100s  SSI as appropriate  Monitor

## 2024-10-12 NOTE — Assessment & Plan Note (Signed)
 Baseline history of 1st AV block s/p pacemaker  Monitor

## 2024-10-12 NOTE — Telephone Encounter (Signed)
 Called for missed remote.

## 2024-10-12 NOTE — ED Notes (Signed)
 RT in to assess patient. RT noted Bipap settings to be 11/8, when previously documented as 14/8. Settings adjusted to patient comfort and need. See flowsheet for details.

## 2024-10-12 NOTE — ED Notes (Signed)
Report given to [facility] RT.

## 2024-10-12 NOTE — Assessment & Plan Note (Signed)
 2D ECHO 04/2022 w/ EF 60-65%, trivial MR, mild AR  Does not appear to have formal diagnosis of heart failure prior to presentation  Progressive shortness of breath over the past 1 to 2 days with noted high salt intake, lower extremity swelling ProBNP in the 6800's with noted vascular congestion and pulmonary edema on chest x-ray S/p 40mg  IV lasix  in the ER   Weight today 65.8 kg  Strict Is and Os and daily weights  Heart failure pathway  2D ECHO  Follow up cardiology recommendations

## 2024-10-12 NOTE — Assessment & Plan Note (Signed)
 Baseline atrial fibrillation with heart rate into the 1 teens on EKG today On Eliquis  and atenolol  chronically Will add on IV diltiazem  for rate management Consider Dilt drip as appropriate Cardiology consulted by ER physician Follow-up recommendations

## 2024-10-12 NOTE — ED Triage Notes (Signed)
 Reports exertional [facility] w/ bilateral leg swelling starting yesterday.

## 2024-10-12 NOTE — ED Notes (Signed)
 Replaced small full face mask, patient tore the mask off of her face. Patient becomes easily agitated. Duoderm placed on the bridge of her nose.

## 2024-10-12 NOTE — ED Notes (Signed)
 Pt did not tolerate bedpan due to SOB. Will need alternative. RN aware

## 2024-10-12 NOTE — Assessment & Plan Note (Addendum)
 Chest Pain  Blood pressure 140s to 190s over 100s to 130s in the setting of volume overload and decompensated respiratory failure requiring BiPAP Nonspecific chest pain while in ER  Suspect secondary to volume overload Troponin negative x 2 NTG gtt in place  EKG A-fib with heart rate into the 110s Status post IV Lasix  Pending IV diltiazem  in setting of A-fib with RVR Prn IV hydralazine  for SBP>170 or DBP>105

## 2024-10-12 NOTE — H&P (Signed)
 " [facility] Telemedicine History and Physical  Referring Provider: Doretha MD  Telemedicine Provider: Elspeth Masters MD  Provider Location: [facility]  Patient Location: [facility] Referring Diagnosis: volume overload Patient Name and DOB verified: Yes  Patient consented to Telemedicine Evaluation:yes  RN virtual assistant: Toribio Jobs RN  Video encounter time and date:10/12/24 6pm   Additional Information:  Transfusion: no  CPAP/BIPAP: yes  O2:yes  Foley: no    Patient: Rachel Hines FMW:[id number] DOB: 1931/07/31 DOA: 10/12/2024 DOS: the patient was seen and examined on 10/12/2024 PCP: Claudene Pellet, MD  Patient coming from: Home  Chief Complaint:  Chief Complaint  Patient presents with   Shortness of Breath   HPI: Rachel Hines is a 89 y.o. female with medical history significant of multiple medical issues including type 2 diabetes, hypertension, hyperlipidemia, GERD, arthritis history of cryptogenic stroke presenting with acute respiratory failure with hypoxia, volume overload, encephalopathy, hypertensive urgency, chest pain.  History primarily from patient's daughter at the bedside Rachel Hines in the setting of encephalopathy.  Per report, patient with increased work of breathing over the past 2 to 3 days and acutely worsening over the past 12 to 24 hours. Daughter denies any known history of heart failure in the past. Daughter does report recent high salt intake recently. Follows Dr. Waddell outpatient. On eliquis . Daughter reports compliance with medication. No fevers or chills. No recent falls. Pt lives at home alone per report.   Presented to ER afebrile, HR into 110s, RR in 20s-30s, BP 140s-190s/100s-130s. Desatting in 80s on RA. Transitioned to BIPAP. WBC 5.7, hgb 10.4, plt 180s. Pro BNP 6813.  CXR w/ cardiomegaly, vascular congestion, and small bilateral pleural effusions. LE u/s negative for DVT. EKG w/ afib w/ HR in 110s.  Review of Systems: As mentioned in the history of present  illness. All other systems reviewed and are negative. Past Medical History:  Diagnosis Date   Acute meniscal injury of left knee    Arthritis    Atrial fibrillation (HCC)    GERD (gastroesophageal reflux disease)    H/O hiatal hernia    Heart block AV first degree    History of esophageal dilatation    Hyperlipidemia    Hypertension    Impaired hearing    Left knee pain    severe   Type 2 diabetes mellitus (HCC)    Wears hearing aid    Past Surgical History:  Procedure Laterality Date   ABDOMINAL HYSTERECTOMY  1960'S   CATARACT EXTRACTION W/ INTRAOCULAR LENS IMPLANT Right    CHONDROPLASTY  04/14/2013   Procedure:  ABRATION CHONDROPLASTY OF MEDIAL CHONDIAL;  Surgeon: Tanda DELENA Heading, MD;  Location: [facility] [place];  Service: Orthopedics;;   INTRAMEDULLARY (IM) NAIL INTERTROCHANTERIC Left 05/23/2024   Procedure: FIXATION, FRACTURE, INTERTROCHANTERIC, WITH INTRAMEDULLARY ROD;  Surgeon: Celena Sharper, MD;  Location: [facility];  Service: Orthopedics;  Laterality: Left;   KNEE ARTHROSCOPY WITH LATERAL MENISECTOMY Left 04/14/2013   Procedure: KNEE ARTHROSCOPY WITH LATERAL MENISECTOMY;  Surgeon: Tanda DELENA Heading, MD;  Location: [facility];  Service: Orthopedics;  Laterality: Left;   KNEE ARTHROSCOPY WITH MEDIAL MENISECTOMY Left 04/14/2013   Procedure: LEFT KNEE ARTHROSCOPY WITH MEDIAL MENISECTOMY;  Surgeon: Tanda DELENA Heading, MD;  Location: [facility];  Service: Orthopedics;  Laterality: Left;   TOTAL KNEE ARTHROPLASTY Right 2011   Social History:  reports that she has never smoked. She has never used smokeless tobacco. She reports that she does not drink alcohol and does  not use drugs.  Allergies[1]  History reviewed. No pertinent family history.  Prior to Admission medications  Medication Sig Start Date End Date Taking? Authorizing Provider  acetaminophen  (TYLENOL ) 500 MG tablet Take 2 tablets (1,000 mg total) by mouth every 8 (eight) hours as  needed for mild pain (pain score 1-3) or moderate pain (pain score 4-6). 05/26/24   Deward Eck, PA-C  apixaban  (ELIQUIS ) 2.5 MG TABS tablet Take 1 tablet (2.5 mg total) by mouth 2 (two) times daily. 05/29/24   Drusilla Sabas RAMAN, MD  atenolol  (TENORMIN ) 25 MG tablet Take 1 tablet (25 mg total) by mouth daily. 08/05/24   Waddell Danelle ORN, MD  cholecalciferol  (CHOLECALCIFEROL ) 25 MCG tablet Take 2 tablets (2,000 Units total) by mouth 2 (two) times daily. 05/26/24   Deward Eck, PA-C  oxyCODONE  (OXY IR/ROXICODONE ) 5 MG immediate release tablet Take 0.5-1 tablets (2.5-5 mg total) by mouth every 4 (four) hours as needed for moderate pain (pain score 4-6) or severe pain (pain score 7-10). 05/26/24   Deward Eck, PA-C  polyethylene glycol (MIRALAX  / GLYCOLAX ) 17 g packet Take 17 g by mouth daily as needed. 05/29/24   Drusilla Sabas RAMAN, MD  rosuvastatin  (CRESTOR ) 5 MG tablet TAKE 1 TABLET BY MOUTH DAILY Patient taking differently: Take 5 mg by mouth in the morning. 09/15/23   Waddell Danelle ORN, MD  Vitamin D , Ergocalciferol , (DRISDOL ) 1.25 MG (50000 UNIT) CAPS capsule Take 1 capsule (50,000 Units total) by mouth every 7 (seven) days. 05/31/24   Deward Eck, PA-C  zolpidem  (AMBIEN ) 10 MG tablet Take 5 mg by mouth at bedtime.    [provider]    Physical Exam: Vitals:   10/12/24 1745 10/12/24 1800 10/12/24 1815 10/12/24 1830  BP: (!) 187/109 (!) 182/129 (!) 187/114 (!) 173/114  Pulse: (!) 104 96 (!) 101 99  Resp: (!) 29 (!) 23 (!) 23 19  Temp:      TempSrc:      SpO2: (!) 89% 96% 100% 100%  Weight:      Height:       Physical Exam Constitutional:      Appearance: She is normal weight.     Comments: Bipap in place  Mildly agitated    HENT:     Head: Normocephalic and atraumatic.     Nose: Nose normal.     Mouth/Throat:     Mouth: Mucous membranes are moist.  Eyes:     Pupils: Pupils are equal, round, and reactive to light.  Cardiovascular:     Rate and Rhythm: Normal rate and regular rhythm.   Pulmonary:     Effort: Pulmonary effort is normal.  Abdominal:     General: Bowel sounds are normal.  Musculoskeletal:        General: Normal range of motion.     Right lower leg: Edema present.     Left lower leg: Edema present.  Neurological:     General: No focal deficit present.  Psychiatric:        Mood and Affect: Mood normal.     Data Reviewed:  There are no new results to review at this time.  Assessment and Plan: * Acute respiratory failure with hypoxia (HCC) Acute respiratory failure now requiring BiPAP in the setting of volume overload,?  Flash pulmonary edema, hypertensive urgency   Chest x-ray with cardiomegaly and pulmonary edema proBNP in the 6800s  BiPAP in place Will add on VBG to correlate Will add on D-dimer x 1, though VTE  less likely in setting of regular eliquis  use  Monitor resp status with diuresis Follow closely   CHF exacerbation (HCC) 2D ECHO 04/2022 w/ EF 60-65%, trivial MR, mild AR  Does not appear to have formal diagnosis of heart failure prior to presentation  Progressive shortness of breath over the past 1 to 2 days with noted high salt intake, lower extremity swelling ProBNP in the 6800's with noted vascular congestion and pulmonary edema on chest x-ray S/p 40mg  IV lasix  in the ER   Weight today 65.8 kg  Strict Is and Os and daily weights  Heart failure pathway  2D ECHO  Follow up cardiology recommendations     Encephalopathy With noted progressive confusion and agitation in the setting of acute respiratory failure with hypoxia Actively agitated on BiPAP Pending as needed Ativan  for agitation in the ER Add on Zyprexa  in the interim-advance to Haldol as appropriate Will check VBG to correlate No active infection noted at present CT head is appropriate Check ammonia level Suspect change, remote history of CVA and likely subclinical dementia as confounding issues  Atrial fibrillation with RVR (HCC) Baseline atrial fibrillation  with heart rate into the 1 teens on EKG today On Eliquis  and atenolol  chronically Will add on IV diltiazem  for rate management Consider Dilt drip as appropriate Cardiology consulted by ER physician Follow-up recommendations  AV block, 1st degree Baseline history of 1st AV block s/p pacemaker  Monitor    Swelling of lower extremity Noted bilateral lower extremity swelling in the setting of volume overload with likely etiology of heart failure Lower extremity ultrasound negative for DVT Some question of right lower extremity ?swelling warm to touch on nursing exam White count within normal limits Will otherwise monitor for now Low threshold for infectious coverage if there is any clinical decompensation  Hypertensive urgency Chest Pain  Blood pressure 140s to 190s over 100s to 130s in the setting of volume overload and decompensated respiratory failure requiring BiPAP Nonspecific chest pain while in ER  Suspect secondary to volume overload Troponin negative x 2 NTG gtt in place  EKG A-fib with heart rate into the 110s Status post IV Lasix  Pending IV diltiazem  in setting of A-fib with RVR Prn IV hydralazine  for SBP>170 or DBP>105    Stage 3a chronic kidney disease (CKD) (HCC) Cr 0.86 today w/ GFR>60  Monitor    Type 2 diabetes mellitus with other specified complication (HCC) Blood sugars in low 100s  SSI as appropriate  Monitor        Advance Care Planning:   Code Status: Prior   Consults: Cardiology on arrival   Family Communication: Daughter at the bedside   Severity of Illness: The appropriate patient status for this patient is INPATIENT. Inpatient status is judged to be reasonable and necessary in order to provide the required intensity of service to ensure the patient's safety. The patient's presenting symptoms, physical exam findings, and initial radiographic and laboratory data in the context of their chronic comorbidities is felt to place them at high risk  for further clinical deterioration. Furthermore, it is not anticipated that the patient will be medically stable for discharge from the hospital within 2 midnights of admission.   * I certify that at the point of admission it is my clinical judgment that the patient will require inpatient hospital care spanning beyond 2 midnights from the point of admission due to high intensity of service, high risk for further deterioration and high frequency of surveillance required.*  Author: Elspeth  JINNY Masters, MD 10/12/2024 6:44 PM  For on call review [email or web address].     [1]  Allergies Allergen Reactions   Atorvastatin Other (See Comments)    Severe muscle pain and bone pain   Other Rash and Other (See Comments)    Food dyes- Rashes and joint pain   "

## 2024-10-12 NOTE — ED Provider Notes (Signed)
 Pt here today with SOB with hx of afib on eliquis .  Sx are classic for CHF exacerbation but also has unilateral leg swelling and getting r/o for dvt.  Has received lasix  here but no hx of lasix  use at home.  Echo with preserved EF in 2023.  While here patient started having worsening back and hip pain as well as some chest pain and reports she is feeling more short of breath.  She is still in atrial fibrillation and rates are between 102-117.  Unclear if this is in the setting of CHF is causing more shortness of breath.  Patient does take Eliquis  and DVT scan was negative for acute clot.   Creatinine is preserved, CBC without significant anemia, BNP is elevated at 6800.  Repeat scan when patient started having more symptoms still showed atrial fibrillation without ST changes.  She received nitroglycerin , metoprolol  without significant improvement in her symptoms.  She was also given a little morphine  for pain control.  Patient reports the pain is a little better but she is still having significant shortness of breath and has pulled off her oxygen and is satting 89% on room air.  Patient's blood pressure continues to be elevated.  Discussed with the patient and her family member that feel that patient has CHF and possible component of flash pulmonary edema with elevated pressures.  Repeat EKG did not have any ST changes concerning for MI.  Delta troponins are pending.  Will need to start patient on BiPAP  Pt which she has agreed to.  Pt improving on bipap.  [place]owerver BP is still elevated and started on NTG gtt. told to the hospitalist for admission to stepdown as well as spoke with Dr. Jordan with cardiology and they will consult.  Somebody just needs to call them upon patient's arrival.    CRITICAL CARE Performed by: Smitty Ackerley Total critical care time: 40 minutes Critical care time was exclusive of separately billable procedures and treating other patients. Critical care was necessary to treat or  prevent imminent or life-threatening deterioration. Critical care was time spent personally by me on the following activities: development of treatment plan with patient and/or surrogate as well as nursing, discussions with consultants, evaluation of patient's response to treatment, examination of patient, obtaining history from patient or surrogate, ordering and performing treatments and interventions, ordering and review of laboratory studies, ordering and review of radiographic studies, pulse oximetry and re-evaluation of patient's condition.    Doretha Folks, MD 10/12/24 [phone]

## 2024-10-13 ENCOUNTER — Inpatient Hospital Stay (HOSPITAL_COMMUNITY)

## 2024-10-13 DIAGNOSIS — J9601 Acute respiratory failure with hypoxia: Secondary | ICD-10-CM | POA: Diagnosis not present

## 2024-10-13 DIAGNOSIS — I480 Paroxysmal atrial fibrillation: Secondary | ICD-10-CM

## 2024-10-13 DIAGNOSIS — J810 Acute pulmonary edema: Secondary | ICD-10-CM | POA: Diagnosis not present

## 2024-10-13 DIAGNOSIS — I4891 Unspecified atrial fibrillation: Secondary | ICD-10-CM

## 2024-10-13 DIAGNOSIS — E1169 Type 2 diabetes mellitus with other specified complication: Secondary | ICD-10-CM

## 2024-10-13 LAB — COMPREHENSIVE METABOLIC PANEL WITH GFR
ALT: 7 U/L (ref 0–44)
AST: 17 U/L (ref 15–41)
Albumin: 3.5 g/dL (ref 3.5–5.0)
Alkaline Phosphatase: 86 U/L (ref 38–126)
Anion gap: 14 (ref 5–15)
BUN: 18 mg/dL (ref 8–23)
CO2: 27 mmol/L (ref 22–32)
Calcium: 9.1 mg/dL (ref 8.9–10.3)
Chloride: 96 mmol/L — ABNORMAL LOW (ref 98–111)
Creatinine, Ser: 0.92 mg/dL (ref 0.44–1.00)
GFR, Estimated: 58 mL/min — ABNORMAL LOW
Glucose, Bld: 101 mg/dL — ABNORMAL HIGH (ref 70–99)
Potassium: 4 mmol/L (ref 3.5–5.1)
Sodium: 137 mmol/L (ref 135–145)
Total Bilirubin: 0.9 mg/dL (ref 0.0–1.2)
Total Protein: 6.1 g/dL — ABNORMAL LOW (ref 6.5–8.1)

## 2024-10-13 LAB — CBC
HCT: 35.6 % — ABNORMAL LOW (ref 36.0–46.0)
Hemoglobin: 11.4 g/dL — ABNORMAL LOW (ref 12.0–15.0)
MCH: 28.9 pg (ref 26.0–34.0)
MCHC: 32 g/dL (ref 30.0–36.0)
MCV: 90.4 fL (ref 80.0–100.0)
Platelets: 196 10*3/uL (ref 150–400)
RBC: 3.94 MIL/uL (ref 3.87–5.11)
RDW: 14.4 % (ref 11.5–15.5)
WBC: 7 10*3/uL (ref 4.0–10.5)
nRBC: 0 % (ref 0.0–0.2)

## 2024-10-13 MED ORDER — ZOLPIDEM TARTRATE 5 MG PO TABS
5.0000 mg | ORAL_TABLET | Freq: Every evening | ORAL | Status: DC | PRN
  Administered 2024-10-13 – 2024-10-14 (×3): 5 mg via ORAL
  Filled 2024-10-13 (×3): qty 1

## 2024-10-13 MED ORDER — ENSURE PLUS HIGH PROTEIN PO LIQD
237.0000 mL | Freq: Two times a day (BID) | ORAL | Status: DC
  Administered 2024-10-14 – 2024-10-15 (×2): 237 mL via ORAL

## 2024-10-13 MED ORDER — METOPROLOL TARTRATE 12.5 MG HALF TABLET
12.5000 mg | ORAL_TABLET | Freq: Two times a day (BID) | ORAL | Status: DC
  Administered 2024-10-13 – 2024-10-15 (×5): 12.5 mg via ORAL
  Filled 2024-10-13 (×5): qty 1

## 2024-10-13 MED ORDER — APIXABAN 2.5 MG PO TABS
2.5000 mg | ORAL_TABLET | Freq: Two times a day (BID) | ORAL | Status: DC
  Administered 2024-10-13 – 2024-10-15 (×6): 2.5 mg via ORAL
  Filled 2024-10-13 (×6): qty 1

## 2024-10-13 NOTE — Plan of Care (Signed)
" °  Problem: Education: Goal: Knowledge of General Education information will improve Description: Including pain rating scale, medication(s)/side effects and non-pharmacologic comfort measures Outcome: Progressing   Problem: Clinical Measurements: Goal: Ability to maintain clinical measurements within normal limits will improve Outcome: Progressing   Problem: Clinical Measurements: Goal: Will remain free from infection Outcome: Progressing   Problem: Clinical Measurements: Goal: Respiratory complications will improve Outcome: Progressing   Problem: Activity: Goal: Risk for activity intolerance will decrease Outcome: Progressing   Problem: Nutrition: Goal: Adequate nutrition will be maintained Outcome: Progressing   Problem: Pain Managment: Goal: General experience of comfort will improve and/or be controlled Outcome: Progressing   Problem: Skin Integrity: Goal: Risk for impaired skin integrity will decrease Outcome: Progressing   "

## 2024-10-13 NOTE — Consult Note (Signed)
 "  Cardiology Consultation   Patient ID: [name] MRN: [id number]; DOB: January 05, 1931  Admit date: 10/12/2024 Date of Consult: 10/13/2024  PCP:  Claudene Pellet, MD   [facility] Providers Cardiologist:  None  Electrophysiologist:  Danelle Birmingham, MD       Patient Profile: Rachel Hines is a 89 y.o. female with a hx of paroxysmal atrial fibrillation, hypertension, ischemic stroke, CHF who is being seen 10/13/2024 for the evaluation of shortness of breath at the request of the Medicine service.  History of Present Illness: History is limited as patient was on BiPAP, and was unable to provide detailed history.  [name] was transferred [facility] for management of shortness of breath and hypoxemia.  She has had worsening leg swelling, however is unclear how long this has occurred for.  She denied chest pain, syncope.  She has previously seen cardiology for management of a cryptogenic stroke.  She had an ILR placed on 05/2022, he was discovered to have runs of atrial fibrillation.  Her CHA2DS2-VASc score is 6.  For stroke reduction, she is on apixaban .  Most recent echocardiogram in 2023 shows an EF of 60 to 65%, inadequate diastology, normal RV function.  Normal size left atrium.  While at [facility], patient was hypoxemic that eventually required BiPAP.  Notable labs include proBNP of 6813, high-sensitivity troponin of 14-> 15, serum creatinine 0.96, potassium 4.2, respiratory panel normal  Past Medical History:  Diagnosis Date   Acute meniscal injury of left knee    Arthritis    Atrial fibrillation (HCC)    GERD (gastroesophageal reflux disease)    H/O hiatal hernia    Heart block AV first degree    History of esophageal dilatation    Hyperlipidemia    Hypertension    Impaired hearing    Left knee pain    severe   Type 2 diabetes mellitus (HCC)    Wears hearing aid     Past Surgical History:  Procedure Laterality Date   ABDOMINAL HYSTERECTOMY  1960'S    CATARACT EXTRACTION W/ INTRAOCULAR LENS IMPLANT Right    CHONDROPLASTY  04/14/2013   Procedure:  ABRATION CHONDROPLASTY OF MEDIAL CHONDIAL;  Surgeon: Tanda DELENA Heading, MD;  Location: [facility];  Service: Orthopedics;;   INTRAMEDULLARY (IM) NAIL INTERTROCHANTERIC Left 05/23/2024   Procedure: FIXATION, FRACTURE, INTERTROCHANTERIC, WITH INTRAMEDULLARY ROD;  Surgeon: Celena Sharper, MD;  Location: [facility] OR;  Service: Orthopedics;  Laterality: Left;   KNEE ARTHROSCOPY WITH LATERAL MENISECTOMY Left 04/14/2013   Procedure: KNEE ARTHROSCOPY WITH LATERAL MENISECTOMY;  Surgeon: Tanda DELENA Heading, MD;  Location: [facility];  Service: Orthopedics;  Laterality: Left;   KNEE ARTHROSCOPY WITH MEDIAL MENISECTOMY Left 04/14/2013   Procedure: LEFT KNEE ARTHROSCOPY WITH MEDIAL MENISECTOMY;  Surgeon: Tanda DELENA Heading, MD;  Location: [facility];  Service: Orthopedics;  Laterality: Left;   TOTAL KNEE ARTHROPLASTY Right 2011     Home Medications:  Prior to Admission medications  Medication Sig Start Date End Date Taking? Authorizing Provider  acetaminophen  (TYLENOL ) 500 MG tablet Take 2 tablets (1,000 mg total) by mouth every 8 (eight) hours as needed for mild pain (pain score 1-3) or moderate pain (pain score 4-6). 05/26/24   Deward Eck, PA-C  apixaban  (ELIQUIS ) 2.5 MG TABS tablet Take 1 tablet (2.5 mg total) by mouth 2 (two) times daily. 05/29/24   Drusilla Sabas RAMAN, MD  atenolol  (TENORMIN ) 25 MG tablet Take 1 tablet (25 mg total) by mouth daily.  08/05/24   Waddell Danelle ORN, MD  cholecalciferol  (CHOLECALCIFEROL ) 25 MCG tablet Take 2 tablets (2,000 Units total) by mouth 2 (two) times daily. 05/26/24   Deward Eck, PA-C  oxyCODONE  (OXY IR/ROXICODONE ) 5 MG immediate release tablet Take 0.5-1 tablets (2.5-5 mg total) by mouth every 4 (four) hours as needed for moderate pain (pain score 4-6) or severe pain (pain score 7-10). 05/26/24   Deward Eck, PA-C  polyethylene glycol (MIRALAX  /  GLYCOLAX ) 17 g packet Take 17 g by mouth daily as needed. 05/29/24   Drusilla Sabas RAMAN, MD  rosuvastatin  (CRESTOR ) 5 MG tablet TAKE 1 TABLET BY MOUTH DAILY Patient taking differently: Take 5 mg by mouth in the morning. 09/15/23   Waddell Danelle ORN, MD  Vitamin D , Ergocalciferol , (DRISDOL ) 1.25 MG (50000 UNIT) CAPS capsule Take 1 capsule (50,000 Units total) by mouth every 7 (seven) days. 05/31/24   Deward Eck, PA-C  zolpidem  (AMBIEN ) 10 MG tablet Take 5 mg by mouth at bedtime.    [provider]    Scheduled Meds:  apixaban   2.5 mg Oral BID   diltiazem   10 mg Intravenous Once   furosemide   40 mg Intravenous Q12H   Continuous Infusions:  nitroGLYCERIN  Stopped (10/12/24 1915)   PRN Meds: hydrALAZINE , nitroGLYCERIN , OLANZapine , ondansetron  **OR** ondansetron  (ZOFRAN ) IV, zolpidem   Allergies:   Allergies[1]  Social History:   Social History   Socioeconomic History   Marital status: Widowed    Spouse name: Not on file   Number of children: Not on file   Years of education: Not on file   Highest education level: Not on file  Occupational History   Not on file  Tobacco Use   Smoking status: Never   Smokeless tobacco: Never  Vaping Use   Vaping status: Never Used  Substance and Sexual Activity   Alcohol use: No   Drug use: No   Sexual activity: Not on file  Other Topics Concern   Not on file  Social History Narrative   Not on file   Social Drivers of Health   Tobacco Use: Low Risk (10/12/2024)   Patient History    Smoking Tobacco Use: Never    Smokeless Tobacco Use: Never    Passive Exposure: Not on file  Financial Resource Strain: Not on file  Food Insecurity: No Food Insecurity (10/12/2024)   Epic    Worried About Programme Researcher, Broadcasting/film/video in the Last Year: Never true    Ran Out of Food in the Last Year: Never true  Transportation Needs: No Transportation Needs (10/12/2024)   Epic    Lack of Transportation (Medical): No    Lack of Transportation (Non-Medical): No   Physical Activity: Not on file  Stress: Not on file  Social Connections: Socially Isolated (10/12/2024)   Social Connection and Isolation Panel    Frequency of Communication with Friends and Family: More than three times a week    Frequency of Social Gatherings with Friends and Family: More than three times a week    Attends Religious Services: Never    Database Administrator or Organizations: No    Attends Banker Meetings: Never    Marital Status: Widowed  Intimate Partner Violence: Not At Risk (10/12/2024)   Epic    Fear of Current or Ex-Partner: No    Emotionally Abused: No    Physically Abused: No    Sexually Abused: No  Depression (PHQ2-9): Not on file  Alcohol Screen: Not on file  Housing:  Low Risk (10/12/2024)   Epic    Unable to Pay for Housing in the Last Year: No    Number of Times Moved in the Last Year: 0    Homeless in the Last Year: No  Utilities: Not At Risk (10/12/2024)   Epic    Threatened with loss of utilities: No  Health Literacy: Not on file    Family History:   History reviewed. No pertinent family history.   ROS:  Please see the history of present illness.   All other ROS reviewed and negative.     Physical Exam/Data: Vitals:   10/12/24 2100 10/12/24 2130 10/12/24 2200 10/12/24 2356  BP: 134/89 121/73 (!) 141/76 114/78  Pulse: 88 97 83 72  Resp: 18 14 17 17   Temp:   97.8 F (36.6 C) (!) 97.4 F (36.3 C)  TempSrc:   Oral Axillary  SpO2: 100% 100% 100% 100%  Weight:   59.2 kg   Height:        Intake/Output Summary (Last 24 hours) at 10/13/2024 0132 Last data filed at 10/12/2024 2003 Gross per 24 hour  Intake --  Output 900 ml  Net -900 ml      10/12/2024   10:00 PM 10/12/2024    1:49 PM 07/01/2024   12:29 PM  Last 3 Weights  Weight (lbs) 130 lb 8.2 oz 145 lb 1 oz 145 lb  Weight (kg) 59.2 kg 65.8 kg 65.772 kg     Body mass index is 23.12 kg/m.  General:  on BIPAP, responds to some questions with head nod HEENT:  normal Neck: difficult to assess while on BIPAP Vascular: No carotid bruits; Distal pulses 2+ bilaterally Cardiac:  irregular rate Lungs:  clear to auscultation bilaterally, no wheezing, rhonchi or rales  Abd: soft, nontender, no hepatomegaly  Ext: Bilateral leg edema right > left Musculoskeletal:  No deformities, BUE and BLE strength normal and equal Skin: warm and dry  Neuro:  CNs 2-12 intact, no focal abnormalities noted Psych:  Normal affect   EKG:  The EKG was personally reviewed and demonstrates: Atrial fibrillation Telemetry:  Telemetry was personally reviewed and demonstrates: Atrial fibrillation  Relevant CV Studies: Reviewed  Laboratory Data: High Sensitivity Troponin:  No results for input(s): TROPONINIHS in the last 720 hours.  Recent Labs  Lab 10/12/24 1311 10/12/24 1624  TRNPT 14 15      Chemistry Recent Labs  Lab 10/12/24 1311 10/12/24 1805 10/12/24 1930  NA 136 135 136  K 5.2* 4.4 4.2  CL 99  --  97*  CO2 26  --  28  GLUCOSE 104*  --  145*  BUN 17  --  19  CREATININE 0.86  --  0.96  CALCIUM  9.6  --  9.7  GFRNONAA >60  --  56*  ANIONGAP 12  --  12    Recent Labs  Lab 10/12/24 1930  PROT 6.6  ALBUMIN 4.1  AST 19  ALT 7  ALKPHOS 99  BILITOT 1.1   Lipids No results for input(s): CHOL, TRIG, HDL, LABVLDL, LDLCALC, CHOLHDL in the last 168 hours.  Hematology Recent Labs  Lab 10/12/24 1311 10/12/24 1805  WBC 5.7  --   RBC 3.53*  --   HGB 10.4* 12.6  HCT 31.6* 37.0  MCV 89.5  --   MCH 29.5  --   MCHC 32.9  --   RDW 14.6  --   PLT 188  --    Thyroid  No results for input(s): TSH,  FREET4 in the last 168 hours.  BNP Recent Labs  Lab 10/12/24 1311  PROBNP 6,813.0*    DDimer No results for input(s): DDIMER in the last 168 hours.  Radiology/Studies:  US  Venous Img Lower Bilateral Result Date: 10/12/2024 CLINICAL DATA:  Swelling EXAM: Bilateral Lower Extremity Venous Doppler Ultrasound TECHNIQUE: Gray-scale sonography  with compression, as well as color and duplex ultrasound, were performed to evaluate the deep venous system(s) from the level of the common femoral vein through the popliteal and proximal calf veins. COMPARISON:  None available FINDINGS: VENOUS Normal compressibility of the common femoral, femoral, and popliteal veins, as well as the visualized calf veins. Visualized portions of profunda femoral vein and great saphenous vein unremarkable. No filling defects to suggest DVT on grayscale or color Doppler imaging. Doppler waveforms show normal direction of venous flow, normal respiratory plasticity and response to augmentation. OTHER None. Limitations: none IMPRESSION: No lower extremity DVT. Electronically Signed   By: Aliene Lloyd M.D.   On: 10/12/2024 16:23   DG Chest Port 1 View Result Date: 10/12/2024 CLINICAL DATA:  Shortness of breath. EXAM: PORTABLE CHEST 1 VIEW COMPARISON:  Chest radiograph dated 07/01/2024. FINDINGS: Cardiomegaly with vascular congestion and small bilateral pleural effusions. No pneumothorax. Loop recorder device. No acute osseous pathology. Osteopenia with degenerative changes. IMPRESSION: Cardiomegaly with vascular congestion and small bilateral pleural effusions. Electronically Signed   By: Vanetta Chou M.D.   On: 10/12/2024 14:04     Assessment and Plan:  SAFIRE GORDIN is a 89 y.o. female with a hx of paroxysmal atrial fibrillation, hypertension, ischemic stroke, CHF who is being seen 10/13/2024 for the evaluation of shortness of breath at the request of the Medicine service.  Hypoxemia likely from pulmonary congestion in setting of diastolic heart failure and atrial fibrillation.  Will continue diuresis with spot doses of IV Lasix .  Once he is more euvolemic, if she still remains in atrial fibrillation, we will consider  cardioversion.  Recommendations -spot diuresis with 40mg  IV lasix  (net -1-2L) -daily K, Mg check -TTE -continue apixaban    Risk Assessment/Risk  Scores:       New York  Heart Association (NYHA) Functional Class NYHA Class II  CHA2DS2-VASc Score =     This indicates a  % annual risk of stroke. The patient's score is based upon:          For questions or updates, please contact [facility] Please consult [email or web address] for contact info under      Signed, Zhyon Antenucci A Bryah Ocheltree, MD  10/13/2024 1:32 AM     [1]  Allergies Allergen Reactions   Atorvastatin Other (See Comments)    Severe muscle pain and bone pain   Other Rash and Other (See Comments)    Food dyes- Rashes and joint pain   "

## 2024-10-13 NOTE — Plan of Care (Signed)
   Problem: Clinical Measurements: Goal: Respiratory complications will improve Outcome: Progressing

## 2024-10-13 NOTE — Evaluation (Addendum)
 Occupational Therapy Evaluation Patient Details Name: Rachel Hines MRN: [id number] DOB: Sep 26, 1930 Today's Date: 10/13/2024   History of Present Illness   Rachel Hines is a 89 y.o. female admitted from [facility] 10/12/24 due to SOB and required BiPAP. Pt was admitted due to Acute respiratory failure with hypoxia.  PMHx: A-fib on Eliquis , CVA, T2DM, HTN, HLD, OA, osteoporosis, CKD 3A, recent admission for a fall sustaining left intertrochanteric proximal femur fracture and left proximal humerus fracture. Pt s/p IM nail of left hip 9/8     Clinical Impressions Pt at PLOF lives alone but has her daughter across the street and agreeable to ramin in the home for the several days as she adjust back to living at home. She was using a SPC and occasionally RW. Pt was mod I in ADLS and daughter assists with IADLS as needed. Pt was able to complete UE dressing post set up and LB with CGA to min assist just for pacing self. At this time recommendation for [facility] but family report they may not want to have due to just finishing in December.Daughter also educated about monitoring o2/HR/BP with the return to home. At this time recommendation for Acute Occupational Therapy to follow and mobility team to follow.      If plan is discharge home, recommend the following:   A little help with walking and/or transfers;A little help with bathing/dressing/bathroom;Assistance with cooking/housework;Assist for transportation;Help with stairs or ramp for entrance     Functional Status Assessment   Patient has had a recent decline in their functional status and demonstrates the ability to make significant improvements in function in a reasonable and predictable amount of time.     Equipment Recommendations   None recommended by OT     Recommendations for Other Services         Precautions/Restrictions   Precautions Precautions: Fall Recall of Precautions/Restrictions: Intact Restrictions Weight  Bearing Restrictions Per Provider Order: No     Mobility Bed Mobility Overal bed mobility: Needs Assistance Bed Mobility: Supine to Sit     Supine to sit: Modified independent (Device/Increase time)     General bed mobility comments: increased time to complete with HOB elevated    Transfers Overall transfer level: Needs assistance Equipment used: Rolling walker (2 wheels) Transfers: Sit to/from Stand, Bed to chair/wheelchair/BSC Sit to Stand: Contact guard assist     Step pivot transfers: Contact guard assist     General transfer comment: STS from EOB and BSC with RW and no physical assistance. Pt completed stand-step transfer to [facility] anterior to patient from EOB with RW and no physical assistance. VC for sequencing.      Balance Overall balance assessment: Needs assistance Sitting-balance support: No upper extremity supported, Feet supported Sitting balance-Leahy Scale: Good Sitting balance - Comments: seated EOB with no LOB   Standing balance support: Single extremity supported, During functional activity Standing balance-Leahy Scale: Fair Standing balance comment: Pt able to stand for peri-care using RUE to clean and LUE stabilized on RW for several short bouts of 30s. As pt fatigues, SpO2 drops with inconsistent waveform, but quickly comes up from 85% to 94% on RA with cues for PLB and seated rest break.                           ADL either performed or assessed with clinical judgement   ADL Overall ADL's : Needs assistance/impaired Eating/Feeding: Independent;Sitting   Grooming: Wash/dry hands;Wash/dry face;Oral  care;Set up;Sitting   Upper Body Bathing: Set up;Sitting   Lower Body Bathing: Contact guard assist;Sitting/lateral leans;Sit to/from stand;Minimal assistance   Upper Body Dressing : Set up;Sitting   Lower Body Dressing: Contact guard assist;Minimal assistance;Sit to/from stand   Toilet Transfer: Contact guard assist;Cueing for  safety;Cueing for sequencing;Rolling walker (2 wheels)   Toileting- Clothing Manipulation and Hygiene: Contact guard assist;Sit to/from stand       Functional mobility during ADLs: Contact guard assist;Rolling walker (2 wheels);Cueing for sequencing;Cueing for safety       Vision Patient Visual Report:  (blind in R eye and parital in L)       Perception         Praxis         Pertinent Vitals/Pain Pain Assessment Pain Assessment: 0-10 Pain Score: 1  Pain Location: back Pain Descriptors / Indicators: Aching     Extremity/Trunk Assessment Upper Extremity Assessment Upper Extremity Assessment: Generalized weakness   Lower Extremity Assessment Lower Extremity Assessment: Generalized weakness   Cervical / Trunk Assessment Cervical / Trunk Assessment: Kyphotic   Communication Communication Communication: Impaired Factors Affecting Communication: Hearing impaired   Cognition Arousal: Alert Behavior During Therapy: WFL for tasks assessed/performed                                 Following commands: Intact       Cueing  General Comments   Cueing Techniques: Verbal cues;Gestural cues  SpO2 initially 94% on RA, drops to 85% with inconsistent waveform after prolonged bout of standing for peri-care when pt's trunk is flexed, with SpO2 quickly coming up to 92% with seated rest break and cues for PLB   Exercises     Shoulder Instructions      Home Living Family/patient expects to be discharged to:: Private residence Living Arrangements: Alone Available Help at Discharge: Family;Available PRN/intermittently (daughter lives across the street) Type of Home:  (townhouse) Home Access: Stairs to enter [employer] of Steps: 1 (small step) Entrance Stairs-Rails: None Home Layout: Two level;Able to live on main level with bedroom/bathroom (living on main level currently) Alternate Level Stairs-Number of Steps: 6+8 Alternate Level Stairs-Rails:  Right;Left Bathroom Shower/Tub: Sponge bathes at baseline   Bathroom Toilet: Handicapped height Bathroom Accessibility: Yes   Home Equipment: Agricultural Consultant (2 wheels);Cane - single point   Additional Comments: blind in R eye, 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 Per pt's daughter, pt was unable to ambulate yesterday ADLs Comments: pt's daughter was assisting with getting groceries and doing chores around her home    OT Problem List: Decreased strength;Decreased activity tolerance;Impaired balance (sitting and/or standing);Decreased knowledge of use of DME or AE   OT Treatment/Interventions: Self-care/ADL training;Therapeutic exercise;DME and/or AE instruction;Therapeutic activities;Patient/family education;Balance training      OT Goals(Current goals can be found in the care plan section)   Acute Rehab OT Goals Patient Stated Goal: to go home OT Goal Formulation: With patient Time For Goal Achievement: 10/27/24 Potential to Achieve Goals: Good   OT Frequency:  Min 2X/week    Co-evaluation PT/OT/SLP Co-Evaluation/Treatment: Yes Reason for Co-Treatment: Complexity of the patient's impairments (multi-system involvement) PT goals addressed during session:  Mobility/safety with mobility;Balance;Proper use of DME;Strengthening/ROM OT goals addressed during session: ADL's and self-care      AM-PAC OT 6 Clicks Daily Activity     Outcome Measure Help from another person eating meals?: None Help from another person taking care of personal grooming?: None Help from another person toileting, which includes using toliet, bedpan, or urinal?: A Little Help from another person bathing (including washing, rinsing, drying)?: A Little Help from  another person to put on and taking off regular upper body clothing?: None Help from another person to put on and taking off regular lower body clothing?: A Little 6 Click Score: 21   End of Session Equipment Utilized During Treatment: Gait belt;Rolling walker (2 wheels) Nurse Communication: Mobility status  Activity Tolerance: Patient tolerated treatment well Patient left: in chair;with call bell/phone within reach;with chair alarm set  OT Visit Diagnosis: Unsteadiness on feet (R26.81);Other abnormalities of gait and mobility (R26.89);Repeated falls (R29.6);Muscle weakness (generalized) (M62.81)                Time: 9167-9080 OT Time Calculation (min): 47 min Charges:  OT General Charges $OT Visit: 1 Visit OT Evaluation $OT Eval Moderate Complexity: 1 Mod  Warrick POUR OTR/L  Acute Rehab Services  [phone] office number   Warrick Berber 10/13/2024, 10:11 AM

## 2024-10-13 NOTE — Progress Notes (Signed)
 The patient was seen and examined.  She is fairly stable on BiPAP.  Agree with current plan.

## 2024-10-13 NOTE — Progress Notes (Signed)
 Echocardiogram Attempted exam at 430pm - provider in room requested I come back later.  Juliene JINNY Rucks 10/13/2024, 4:38 PM

## 2024-10-13 NOTE — TOC CM/SW Note (Signed)
 Transition of Care [facility]) - Inpatient Brief Assessment   Patient Details  Name: Rachel Hines MRN: [id number] Date of Birth: May 14, 1931  [facility] CM/SW Contact:    Waddell Barnie Rama, RN Phone Number: 10/13/2024, 2:54 PM   Clinical Narrative: From home alone, has PCP and insurance on file, states has no HH services in place at this time  had [facility] in the past, has walker and a cane at home.  States family member  (daughter) will transport them home at [employer] and family is support system, states gets medications from [employer].  Pta self ambulatory with walker/cane.   Per pt eval rec HHPT, HHOT.  NCM asked if could set this up for her, she states she does want HH, she had it before.  She has an apt with her PCP in March and this NCM scheduled a sooner  apt for her for hospital follow up see AVS.   Transition of Care Asessment: Insurance and Status: Insurance coverage has been reviewed Patient has primary care physician: Yes Home environment has been reviewed: home alone Prior level of function:: ambulatory with walker/cane Prior/Current Home Services: Current home services (walker/cane) Social Drivers of Health Review: SDOH reviewed no interventions necessary Readmission risk has been reviewed: Yes Transition of care needs: transition of care needs identified, TOC will continue to follow

## 2024-10-13 NOTE — Progress Notes (Signed)
 Brief cardiology note: Patient seen overnight by Dr. Gail. Patient feels her breathing is better, not at baseline. Family notes she had progressive symptoms for at least a week but declined hospital evaluation. Both note significant urine output.  Exam with elderly woman, NAD, on [place]. JVD low neck sitting upright. Lungs diminished bilaterally with faint crackles. Abdomen soft. Extremities warm, well perfused. Bilateral LE edema 1-2+ with start of skin wrinkling.  Afib, variable rates  Plan: Continue diuresis, agree with lasix  BID, good urine output. Continue apixaban . Echo today. She is on atenolol  as outpatient, warm on exam, will start low dose fractionated metoprolol  for rate control, blood pressures have been variable but trending lower so will add hold parameters.  Shelda Bruckner, MD, PhD, [facility]  [facility]  [place]  [facility] at [facility] at [facility] [address], [address] [place], [place] [address] [phone]

## 2024-10-13 NOTE — Evaluation (Addendum)
 Physical Therapy Evaluation Patient Details Name: Rachel Hines MRN: [id number] DOB: 03-13-1931 Today's Date: 10/13/2024  History of Present Illness  Rachel Hines is a 89 y.o. female admitted from [facility] 10/12/24 due to SOB and required BiPAP. Pt was admitted due to Acute respiratory failure with hypoxia.  PMHx: A-fib on Eliquis , CVA, T2DM, HTN, HLD, OA, osteoporosis, CKD 3A, recent admission for a fall sustaining left intertrochanteric proximal femur fracture and left proximal humerus fracture. Pt s/p IM nail of left hip 9/8   Clinical Impression  Prior to admittance, pt was ambulating mod I with SPC and occasionally utilizing RW and was indep with aDLs. Pt presents to evaluation with deficits in mobility, strength, power, activity tolerance, and balance. Pt was able to ambulate short room level distances and complete multiple transfers with AD and no physical; no signs of instability or losses of balance noted. PT will continue to treat pt while she is admitted. Recommending HHPT at discharge to address remaining mobility deficits and optimize return to PLOF.       If plan is discharge home, recommend the following: Assist for transportation;Help with stairs or ramp for entrance   Can travel by private vehicle        Equipment Recommendations None recommended by PT  Recommendations for Other Services       Functional Status Assessment Patient has had a recent decline in their functional status and demonstrates the ability to make significant improvements in function in a reasonable and predictable amount of time.     Precautions / Restrictions Precautions Precautions: Fall Recall of Precautions/Restrictions: Intact Restrictions Weight Bearing Restrictions Per Provider Order: No      Mobility  Bed Mobility Overal bed mobility: Modified Independent             General bed mobility comments: increased time to complete with HOB elevated    Transfers Overall transfer  level: Needs assistance Equipment used: Rolling walker (2 wheels) Transfers: Sit to/from Stand, Bed to chair/wheelchair/BSC Sit to Stand: Contact guard assist   Step pivot transfers: Contact guard assist       General transfer comment: STS from EOB and BSC with RW and no physical assistance. Pt completed stand-step transfer to [facility] anterior to patient from EOB with RW and no physical assistance. VC for sequencing.    Ambulation/Gait Ambulation/Gait assistance: Contact guard assist Gait Distance (Feet): 15 Feet Assistive device: Rolling walker (2 wheels) Gait Pattern/deviations: Step-through pattern, Decreased stride length, Trunk flexed, Narrow base of support, Shuffle, Decreased dorsiflexion - right Gait velocity: reduced Gait velocity interpretation: <1.31 ft/sec, indicative of household ambulator   General Gait Details: Pt demonstrates slowed reciprocal gait pattern with reduced DF on RLE displaying R foot drag and more shuffle-like gait. No LOB noted.  Stairs            Wheelchair Mobility     Tilt Bed    Modified Rankin (Stroke Patients Only)       Balance Overall balance assessment: Needs assistance Sitting-balance support: No upper extremity supported, Feet supported Sitting balance-Leahy Scale: Good Sitting balance - Comments: seated EOB with no LOB   Standing balance support: Single extremity supported, During functional activity Standing balance-Leahy Scale: Fair Standing balance comment: Pt able to stand for peri-care using RUE to clean and LUE stabilized on RW for several short bouts of 30s. As pt fatigues, SpO2 drops with inconsistent waveform, but quickly comes up from 85% to 94% on RA with cues for PLB and seated rest  break.                             Pertinent Vitals/Pain Pain Assessment Pain Assessment: No/denies pain    Home Living Family/patient expects to be discharged to:: Private residence Living Arrangements: Alone Available  Help at Discharge: Family;Available PRN/intermittently (daughter lives across the street and is able to assist if needed) Type of Home:  (townhouse) Home Access: Stairs to enter Entrance Stairs-Rails: None Entrance Stairs-Number of Steps: 1 (small step)   Home Layout: Two level;Able to live on main level with bedroom/bathroom (bed is downstairs and pt is currently living on main floor) Home Equipment: Rolling Walker (2 wheels);Cane - single point Additional Comments: blind in R eye, 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 Per pt's daughter, pt was unable to ambulate yesterday ADLs Comments: pt's daughter was assisting with getting groceries and doing chores around her home     Extremity/Trunk Assessment   Upper Extremity Assessment Upper Extremity Assessment: Defer to OT evaluation    Lower Extremity Assessment Lower Extremity Assessment: Generalized weakness    Cervical / Trunk Assessment Cervical / Trunk Assessment: Kyphotic  Communication   Communication Communication: Impaired Factors Affecting Communication: Hearing impaired    Cognition Arousal: Alert Behavior During Therapy: WFL for tasks assessed/performed   PT - Cognitive impairments: No apparent impairments                         Following commands: Intact       Cueing Cueing Techniques: Verbal cues, Gestural cues     General Comments General comments (skin integrity, edema, etc.): SpO2 initially 94% on RA, drops to 85% with inconsistent waveform after prolonged bout of standing for peri-care when pt's trunk is flexed, with SpO2 quickly coming up to 92% with seated rest break and cues for PLB    Exercises     Assessment/Plan    PT  Assessment Patient needs continued PT services  PT Problem List Decreased strength;Decreased balance;Decreased activity tolerance;Decreased mobility;Cardiopulmonary status limiting activity       PT Treatment Interventions DME instruction;Gait training;Functional mobility training;Stair training;Therapeutic activities;Therapeutic exercise;Balance training;Manual techniques;Modalities    PT Goals (Current goals can be found in the Care Plan section)  Acute Rehab PT Goals Patient Stated Goal: to go home PT Goal Formulation: With patient/family Time For Goal Achievement: 10/27/24 Potential to Achieve Goals: Good    Frequency Min 2X/week     Co-evaluation PT/OT/SLP Co-Evaluation/Treatment: Yes Reason for Co-Treatment: Complexity of the patient's impairments (multi-system involvement);For patient/therapist safety;To address functional/ADL transfers (pt with multiple recent fxs) PT goals addressed during session: Mobility/safety with mobility;Balance;Proper use of DME;Strengthening/ROM         AM-PAC PT 6 Clicks Mobility  Outcome Measure Help needed turning from your back to your side while in a flat bed without using bedrails?: None Help needed moving from lying on your back to sitting on the side of a flat bed without using bedrails?: None Help needed moving to and from a bed to a chair (including a wheelchair)?: A Little Help needed standing up from a chair using your arms (e.g., wheelchair or bedside  chair)?: A Little Help needed to walk in hospital room?: A Little Help needed climbing 3-5 steps with a railing? : Total 6 Click Score: 18    End of Session Equipment Utilized During Treatment: Gait belt Activity Tolerance: Patient tolerated treatment well Patient left: in chair;with call bell/phone within reach;with chair alarm set;with family/visitor present Nurse Communication: Mobility status PT Visit Diagnosis: Other abnormalities of gait and mobility (R26.89);Muscle  weakness (generalized) (M62.81)    Time: 9167-9080 PT Time Calculation (min) (ACUTE ONLY): 47 min   Charges:   PT Evaluation $PT Eval Moderate Complexity: 1 Mod   PT General Charges $$ ACUTE PT VISIT: 1 Visit  $ Therapeutic Activity: 8-22 mins         Leontine Hilt DPT Acute Rehab Services [phone] Prefer contact via chat   Leontine NOVAK Dieter Hane 10/13/2024, 9:59 AM

## 2024-10-13 NOTE — Progress Notes (Signed)
 [facility]  PROGRESS NOTE  Rachel Hines FMW:[id number] DOB: June 21, 1931 DOA: 10/12/2024 PCP: Rachel Pellet, MD   Brief HPI:   89 y.o. female with medical history significant of multiple medical issues including type 2 diabetes, hypertension, hyperlipidemia, GERD, arthritis history of cryptogenic stroke presenting with acute respiratory failure with hypoxia, volume overload, encephalopathy, hypertensive urgency, chest pain.  History primarily from patient's daughter at the bedside Rachel Hines in the setting of encephalopathy.  Per report, patient with increased work of breathing over the past 2 to 3 days and acutely worsening over the past 12 to 24 hours. Daughter denies any known history of heart failure in the past. Daughter does report recent high salt intake recently. Follows Dr. Waddell outpatient. On eliquis . Daughter reports compliance with medication. No fevers or chills. No recent falls. Pt lives at home alone per report.   Presented to ER afebrile, HR into 110s, RR in 20s-30s, BP 140s-190s/100s-130s. Desatting in 80s on RA. Transitioned to BIPAP. WBC 5.7, hgb 10.4, plt 180s. Pro BNP 6813.  CXR w/ cardiomegaly, vascular congestion, and small bilateral pleural effusions. LE u/s negative for DVT. EKG w/ afib w/ HR in 110s.     Assessment/Plan:    Acute respiratory failure with hypoxia (HCC) -In setting of pulmonary edema, required BiPAP -Chest x-ray showed cardiomegaly with pulmonary edema -BNP elevated 6800 -Respiratory status has improved with diuresis      CHF exacerbation (HCC) 2D ECHO 04/2022 w/ EF 60-65%, trivial MR, mild AR  Does not appear to have formal diagnosis of heart failure prior to presentation  Progressive shortness of breath over the past 1 to 2 days with noted high salt intake, lower extremity swelling ProBNP in the 6800's with noted vascular congestion and pulmonary edema on chest x-ray -Started on Lasix  40 mg IV every 12 hours Strict Is and Os and daily weights   Heart failure pathway  2D ECHO  Follow up cardiology recommendations       Encephalopathy -Resolved With noted progressive confusion and agitation in the setting of acute respiratory failure with hypoxia Actively agitated on BiPAP Pending as needed Ativan  for agitation in the ER Add on Zyprexa  in the interim-advance to Haldol as appropriate No active infection noted at present CT head is appropriate Suspect change, remote history of CVA and likely subclinical dementia as confounding issues   Atrial fibrillation with RVR (HCC) Baseline atrial fibrillation  On Eliquis  and atenolol  chronically Cardiology following  AV block, 1st degree Baseline history of 1st AV block s/p pacemaker  Monitor      Swelling of lower extremity -Swelling improved with diuresis Noted bilateral lower extremity swelling in the setting of volume overload with likely etiology of heart failure Lower extremity ultrasound negative for DVT    Hypertensive urgency Chest Pain  Suspect secondary to volume overload Troponin negative x 2 NTG gtt in place  EKG A-fib with heart rate into the 110s Status post IV Lasix       Stage 3a chronic kidney disease (CKD) (HCC) Cr 0.86 today w/ GFR>60  Monitor      Type 2 diabetes mellitus with other specified complication (HCC) Blood sugars in low 100s  SSI as appropriate  Monitor          DVT prophylaxis: Apixaban   Medications     apixaban   2.5 mg Oral BID   diltiazem   10 mg Intravenous Once   furosemide   40 mg Intravenous Q12H     Data Reviewed:   CBG:  No results  for input(s): GLUCAP in the last 168 hours.  SpO2: 100 % O2 Flow Rate (L/min): 2 L/min FiO2 (%): 28 %    Vitals:   10/12/24 2130 10/12/24 2200 10/12/24 2356 10/13/24 0356  BP: 121/73 (!) 141/76 114/78 102/77  Pulse: 97 83 72 79  Resp: 14 17 17 18   Temp:  97.8 F (36.6 C) (!) 97.4 F (36.3 C) 97.6 F (36.4 C)  TempSrc:  Oral Axillary Axillary  SpO2: 100% 100% 100%    Weight:  59.2 kg  58.1 kg  Height:          Data Reviewed:  Basic Metabolic Panel: Recent Labs  Lab 10/12/24 1311 10/12/24 1805 10/12/24 1930 10/13/24 0201  NA 136 135 136 137  K 5.2* 4.4 4.2 4.0  CL 99  --  97* 96*  CO2 26  --  28 27  GLUCOSE 104*  --  145* 101*  BUN 17  --  19 18  CREATININE 0.86  --  0.96 0.92  CALCIUM  9.6  --  9.7 9.1    CBC: Recent Labs  Lab 10/12/24 1311 10/12/24 1805 10/13/24 0201  WBC 5.7  --  7.0  NEUTROABS 4.3  --   --   HGB 10.4* 12.6 11.4*  HCT 31.6* 37.0 35.6*  MCV 89.5  --  90.4  PLT 188  --  196    LFT Recent Labs  Lab 10/12/24 1930 10/13/24 0201  AST 19 17  ALT 7 7  ALKPHOS 99 86  BILITOT 1.1 0.9  PROT 6.6 6.1*  ALBUMIN 4.1 3.5     Antibiotics: Anti-infectives (From admission, onward)    None        CONSULTS cardiology  Code Status: DNR  Family Communication: Discussed with patient's daughter at bedside     Subjective   Patient seen and examined, breathing has improved with IV Lasix .   Objective    Physical Examination:  General-appears in no acute distress Heart-S1-S2, regular, no murmur auscultated Lungs-clear to auscultation bilaterally, no wheezing or crackles auscultated Abdomen-soft, nontender, no organomegaly Extremities-trace edema in the lower extremities Neuro-alert, oriented x3, no focal deficit noted            Rachel Hines S Rachel Hines   [facility] If 7PM-7AM, please contact night-coverage at [email or web address], Office  [phone]   10/13/2024, 8:36 AM  LOS: 1 day

## 2024-10-13 NOTE — Consult Note (Signed)
 "                                               Consultation Note Date: 10/13/2024   Patient Name: Rachel Hines  DOB: November 03, 1930  MRN: [id number]  Age / Sex: 89 y.o., female  PCP: Claudene Pellet, MD Referring Physician: Drusilla Sabas RAMAN, MD  Reason for Consultation: Establishing goals of care  HPI/Patient Profile: 89 y.o. female  with past medical history of type 2 diabetes, hypertension, hyperlipidemia, GERD, arthritis, and history of cryptogenic stroke  admitted on 10/12/2024 with acute respiratory failure with hypoxia, volume overload, encephalopathy, hypertensive urgency, and chest pain . Patient treated for CHF exacerbation and acute respiratory failure. She initially required bipap - now on nasal cannula. PMT consulted to discuss GOC.   Clinical Assessment and Goals of Care: I have reviewed medical records including EPIC notes, labs and imaging, received report from RN, assessed the patient and then met with patient, her daughter [name], and granddaugher  to discuss diagnosis prognosis, GOC, EOL wishes, disposition and options.  I introduced Palliative Medicine as specialized medical care for people living with serious illness. It focuses on providing relief from the symptoms and stress of a serious illness. The goal is to improve quality of life for both the patient and the family.  Patient lives alone, but very close to daughter who checks on her frequently.   As far as functional and nutritional status they report patient uses a cane at home and is able to independently toilet herself and give herself sponge baths. She doesn't each much - describes herself as a nibbler. Notes some trouble swallowing. Daughter shares she has lost 12 pounds since Thanksgiving. They also share of some short term memory loss.   Daughter shares of a fall in September that led to fractured arm and leg that required surgery.    We discussed patient's current illness and what it means in the larger context of  patient's on-going co-morbidities.  Natural disease trajectory and expectations at EOL were discussed. We discuss her new diagnosis of heart failure, pending echo. We discuss medications started for this.   I attempted to elicit values and goals of care important to the patient.  She tells me she really wants to avoid the hospital. She also shares she isnt interested in rehabilitation - has had home health before and doesn't want it again.   The difference between aggressive medical intervention and comfort care was considered in light of the patient's goals of care. Patient reports that she wants to avoid aggressive medical interventions - this would include intubation  - she wants to focus on having quality time at home and avoiding repeat hospitalizations.   We discuss changing code status to DNR/DNI - all agree.   Discussed with patient and family the importance of continued conversation with family and the medical providers regarding overall plan of care and treatment options, ensuring decisions are within the context of the patients values and GOCs.    Hospice services outpatient were explained and offered. Discussed home health vs hospice. Patient is certain she doesn't want home health. Discussed hospice philosophy of care and support provided. Daughter has experience with other family receiving hospice care and feels the support they provide is in line with what patient wants. They agree to referral to hospice - request [facility] - for more  discussion regarding hospice at home.   Questions and concerns were addressed. The family was encouraged to call with questions or concerns.    Primary Decision Maker NEXT OF KIN - daughter - patient able to participate in decision making but does have MCI or early dementia - daughter should be present for major decisions    SUMMARY OF RECOMMENDATIONS   - consult SLP per patient and daughter request - ongoing trouble swallowing, history of  esophageal stricture - code status changed to DNR/DNI - consult TOC and hospice liaison - interested in speaking to hospice about care at home - not interested in home health  Code Status/Advance Care Planning: DNR      Primary Diagnoses: Present on Admission:  Acute respiratory failure with hypoxia (HCC)  Stage 3a chronic kidney disease (CKD) (HCC)  Type 2 diabetes mellitus with other specified complication (HCC)   I have reviewed the medical record, interviewed the patient and family, and examined the patient. The following aspects are pertinent.  Past Medical History:  Diagnosis Date   Acute meniscal injury of left knee    Arthritis    Atrial fibrillation (HCC)    GERD (gastroesophageal reflux disease)    H/O hiatal hernia    Heart block AV first degree    History of esophageal dilatation    Hyperlipidemia    Hypertension    Impaired hearing    Left knee pain    severe   Type 2 diabetes mellitus (HCC)    Wears hearing aid    Social History   Socioeconomic History   Marital status: Widowed    Spouse name: Not on file   Number of children: Not on file   Years of education: Not on file   Highest education level: Not on file  Occupational History   Not on file  Tobacco Use   Smoking status: Never   Smokeless tobacco: Never  Vaping Use   Vaping status: Never Used  Substance and Sexual Activity   Alcohol use: No   Drug use: No   Sexual activity: Not on file  Other Topics Concern   Not on file  Social History Narrative   Not on file   Social Drivers of Health   Tobacco Use: Low Risk (10/12/2024)   Patient History    Smoking Tobacco Use: Never    Smokeless Tobacco Use: Never    Passive Exposure: Not on file  Financial Resource Strain: Not on file  Food Insecurity: No Food Insecurity (10/12/2024)   Epic    Worried About Programme Researcher, Broadcasting/film/video in the Last Year: Never true    Ran Out of Food in the Last Year: Never true  Transportation Needs: No  Transportation Needs (10/12/2024)   Epic    Lack of Transportation (Medical): No    Lack of Transportation (Non-Medical): No  Physical Activity: Not on file  Stress: Not on file  Social Connections: Socially Isolated (10/12/2024)   Social Connection and Isolation Panel    Frequency of Communication with Friends and Family: More than three times a week    Frequency of Social Gatherings with Friends and Family: More than three times a week    Attends Religious Services: Never    Database Administrator or Organizations: No    Attends Banker Meetings: Never    Marital Status: Widowed  Depression (PHQ2-9): Not on file  Alcohol Screen: Not on file  Housing: Low Risk (10/12/2024)   Epic  Unable to Pay for Housing in the Last Year: No    Number of Times Moved in the Last Year: 0    Homeless in the Last Year: No  Utilities: Not At Risk (10/12/2024)   Epic    Threatened with loss of utilities: No  Health Literacy: Not on file   History reviewed. No pertinent family history. Scheduled Meds:  apixaban   2.5 mg Oral BID   furosemide   40 mg Intravenous Q12H   metoprolol  tartrate  12.5 mg Oral BID   Continuous Infusions:  nitroGLYCERIN  Stopped (10/12/24 1915)   PRN Meds:.hydrALAZINE , nitroGLYCERIN , OLANZapine , ondansetron  **OR** ondansetron  (ZOFRAN ) IV, zolpidem  Allergies[1] Review of Systems  Constitutional:  Positive for appetite change.  HENT:  Positive for trouble swallowing.   Cardiovascular:  Positive for leg swelling.    Physical Exam Constitutional:      General: She is not in acute distress.    Appearance: She is ill-appearing.  Pulmonary:     Effort: Pulmonary effort is normal.  Skin:    General: Skin is warm and dry.  Neurological:     Mental Status: She is alert and oriented to person, place, and time.     Comments: Confusion and forgetfulness noted throughout conversation     Vital Signs: BP 102/77 (BP Location: Left Arm)   Pulse 79   Temp 97.6 F  (36.4 C) (Axillary)   Resp 18   Ht 5' 3 (1.6 m)   Wt 58.1 kg   SpO2 96%   BMI 22.69 kg/m  Pain Scale: 0-10   Pain Score: 0-No pain   SpO2: SpO2: 96 % O2 Device:SpO2: 96 % O2 Flow Rate: .O2 Flow Rate (L/min): 2 L/min  IO: Intake/output summary:  Intake/Output Summary (Last 24 hours) at 10/13/2024 1648 Last data filed at 10/13/2024 1300 Gross per 24 hour  Intake 100 ml  Output 2300 ml  Net -2200 ml    LBM:   Baseline Weight: Weight: 65.8 kg Most recent weight: Weight: 58.1 kg     Palliative Assessment/Data: PPS 40%     *Please note that this is a verbal dictation therefore any spelling or grammatical errors are due to the Dragon Medical One system interpretation.   I personally spent a total of 80 minutes in the care of the patient today including preparing to see the patient, performing a medically appropriate exam/evaluation, counseling and educating, placing orders, referring and communicating with other health care professionals, documenting clinical information in the EHR, and coordinating care.     Tobey Jama Barnacle, DNP, AGNP-C Palliative Medicine Team [phone] Pager: [phone]     [1]  Allergies Allergen Reactions   Atorvastatin Other (See Comments)    Severe muscle pain and bone pain   Other Rash and Other (See Comments)    Food dyes- Rashes and joint pain   "

## 2024-10-13 NOTE — Discharge Instructions (Signed)
 [facility]   [facility]  - Address: [address] [place], [place] [address] - General office #:  [phone] - Enrollment Phone #: [phone]  [facility]  - [facility] Line: call toll free, available 24 hours a day, at [phone]   [place] [address]  [place] 2-1-1 is another useful way to locate resources in the community. Visit shedsizes.ch to find service information online. If you need additional assistance, 2-1-1 Referral Specialists are available 24 hours a day, every day by dialing 2-1-1 or [phone] from any phone. The call is free, confidential, and available in any language.

## 2024-10-14 ENCOUNTER — Inpatient Hospital Stay (HOSPITAL_COMMUNITY)

## 2024-10-14 DIAGNOSIS — I5033 Acute on chronic diastolic (congestive) heart failure: Secondary | ICD-10-CM

## 2024-10-14 DIAGNOSIS — I480 Paroxysmal atrial fibrillation: Principal | ICD-10-CM

## 2024-10-14 DIAGNOSIS — I4891 Unspecified atrial fibrillation: Secondary | ICD-10-CM | POA: Diagnosis not present

## 2024-10-14 DIAGNOSIS — J9601 Acute respiratory failure with hypoxia: Secondary | ICD-10-CM | POA: Diagnosis not present

## 2024-10-14 DIAGNOSIS — E1169 Type 2 diabetes mellitus with other specified complication: Secondary | ICD-10-CM | POA: Diagnosis not present

## 2024-10-14 DIAGNOSIS — J810 Acute pulmonary edema: Secondary | ICD-10-CM | POA: Diagnosis not present

## 2024-10-14 LAB — ECHOCARDIOGRAM COMPLETE
Area-P 1/2: 4.04 cm2
Calc EF: 55.3 %
Est EF: 55
Height: 63 in
P 1/2 time: 163 ms
S' Lateral: 2.2 cm
Single Plane A2C EF: 52.5 %
Single Plane A4C EF: 57.8 %
Weight: 2000.01 [oz_av]

## 2024-10-14 NOTE — Plan of Care (Signed)
   Problem: Clinical Measurements: Goal: Diagnostic test results will improve Outcome: Progressing Goal: Respiratory complications will improve Outcome: Progressing

## 2024-10-14 NOTE — Progress Notes (Signed)
 Physical Therapy Treatment Patient Details Name: Rachel Hines MRN: [id number] DOB: [birth year] Today's Date: 10/14/2024   History of Present Illness Rachel Hines is a 89 y.o. female admitted from [facility] 10/12/24 due to SOB and required BiPAP. Pt was admitted due to Acute respiratory failure with hypoxia.  PMHx: A-fib on Eliquis , CVA, T2DM, HTN, HLD, OA, osteoporosis, CKD 3A, recent admission for a fall sustaining left intertrochanteric proximal femur fracture and left proximal humerus fracture. Pt s/p IM nail of left hip 9/8    PT Comments  Pt tolerated session well, progressing distance ambulated with AD and no physical assistance. Pt attempted ambulating on RA desaturating to 85% with consistent waveform requiring 2L to remain above 90%. With AD, pt showed no signs of instability or LOB and was encouraged to continue to use RW at home for improved stability. PT will continue to treat pt while she is admitted. Continuing to recommend HHPT at discharge to address remaining mobility deficits and optimize return to PLOF.     If plan is discharge home, recommend the following: Assist for transportation;Help with stairs or ramp for entrance   Can travel by private vehicle        Equipment Recommendations  None recommended by PT    Recommendations for Other Services       Precautions / Restrictions Precautions Precautions: Fall Recall of Precautions/Restrictions: Intact Precaution/Restrictions Comments: watch oxygen (desaturates while ambulating) Restrictions Weight Bearing Restrictions Per Provider Order: No     Mobility  Bed Mobility Overal bed mobility: Modified Independent Bed Mobility: Supine to Sit     Supine to sit: Modified independent (Device/Increase time)     General bed mobility comments: increased time to complete with HOB elevated    Transfers Overall transfer level: Modified independent Equipment used: Rolling walker (2 wheels) Transfers: Sit to/from  Stand Sit to Stand: Modified independent (Device/Increase time)           General transfer comment: Pt attempted STS from EOB without AD, demonstrating mild LOB to the R requiring minimal physical assistance to remain upright with pt reaching for objects for greater stability. Pt then completed STS with RW and no physical assistance, demonstrating improved stability. VC for sequencing, and UE placement.    Ambulation/Gait Ambulation/Gait assistance: Supervision Gait Distance (Feet): 125 Feet Assistive device: Rolling walker (2 wheels) Gait Pattern/deviations: Step-through pattern, Decreased stride length, Shuffle, Trunk flexed, Narrow base of support Gait velocity: reduced Gait velocity interpretation: <1.31 ft/sec, indicative of household ambulator   General Gait Details: Pt demonstrates slowed reciprocal gait pattern with increased reliance on RW for improved stability.   Stairs             Wheelchair Mobility     Tilt Bed    Modified Rankin (Stroke Patients Only)       Balance Overall balance assessment: Needs assistance Sitting-balance support: No upper extremity supported, Feet supported Sitting balance-Leahy Scale: Good Sitting balance - Comments: seated EOB with no LOB or signs of instability   Standing balance support: No upper extremity supported, During functional activity Standing balance-Leahy Scale: Poor Standing balance comment: Pt attempted to stand without AD per PLOF, but had a L lateral LOB requiring minimal physical assistance to remain upright with pt reaching for objects for improved stability. Pt demonstrates improved stability with BUE supported on walker                            Communication Communication  Communication: Impaired Factors Affecting Communication: Hearing impaired  Cognition Arousal: Alert Behavior During Therapy: WFL for tasks assessed/performed   PT - Cognitive impairments: No apparent impairments                          Following commands: Intact      Cueing Cueing Techniques: Verbal cues, Gestural cues  Exercises      General Comments General comments (skin integrity, edema, etc.): SpO2 96% on RA at rest, SpO2 85% while ambulating, requiring 2L to remain >90%      Pertinent Vitals/Pain Pain Assessment Pain Assessment: No/denies pain    Home Living                          Prior Function            PT Goals (current goals can now be found in the care plan section) Acute Rehab PT Goals Patient Stated Goal: to go home PT Goal Formulation: With patient/family Time For Goal Achievement: 10/27/24 Potential to Achieve Goals: Good Progress towards PT goals: Progressing toward goals    Frequency    Min 2X/week      PT Plan      Co-evaluation              AM-PAC PT 6 Clicks Mobility   Outcome Measure  Help needed turning from your back to your side while in a flat bed without using bedrails?: None Help needed moving from lying on your back to sitting on the side of a flat bed without using bedrails?: None Help needed moving to and from a bed to a chair (including a wheelchair)?: None Help needed standing up from a chair using your arms (e.g., wheelchair or bedside chair)?: None Help needed to walk in hospital room?: A Little Help needed climbing 3-5 steps with a railing? : Total 6 Click Score: 20    End of Session Equipment Utilized During Treatment: Gait belt;Oxygen Activity Tolerance: Patient tolerated treatment well Patient left: in chair;with call bell/phone within reach;with family/visitor present Nurse Communication: Mobility status;Other (comment) (oxygen needs) PT Visit Diagnosis: Other abnormalities of gait and mobility (R26.89);Muscle weakness (generalized) (M62.81)     Time: 9056-8995 PT Time Calculation (min) (ACUTE ONLY): 21 min  Charges:    $Gait Training: 8-22 mins PT General Charges $$ ACUTE PT VISIT: 1  Visit                     Leontine Hilt DPT Acute Rehab Services [phone] Prefer contact via chat    Leontine NOVAK Jaidah Lomax 10/14/2024, 10:22 AM

## 2024-10-14 NOTE — Progress Notes (Signed)
 "                                                                                                                                                                                               Daily Progress Note   Patient Name: Rachel Hines       Date: 10/14/2024 DOB: 10/12/1930  Age: 89 y.o. MRN#: [id number] Attending Physician: Drusilla Sabas RAMAN, MD Primary Care Physician: Claudene Pellet, MD Admit Date: 10/12/2024  Reason for Consultation/Follow-up: Establishing goals of care  Subjective: No complaints - no shortness of breath or chest pain. Feels well. Sitting up in chair. Continues to require oxygen. Awaiting echo.  Length of Stay: 2  Current Medications: Scheduled Meds:   apixaban   2.5 mg Oral BID   feeding supplement  237 mL Oral BID BM   furosemide   40 mg Intravenous Q12H   metoprolol  tartrate  12.5 mg Oral BID    Continuous Infusions:  nitroGLYCERIN  Stopped (10/12/24 1915)    PRN Meds: hydrALAZINE , nitroGLYCERIN , OLANZapine , ondansetron  **OR** ondansetron  (ZOFRAN ) IV, zolpidem   Physical Exam Constitutional:      General: She is not in acute distress.    Appearance: She is ill-appearing.  Pulmonary:     Effort: Pulmonary effort is normal.  Skin:    General: Skin is warm and dry.  Neurological:     Mental Status: She is alert and oriented to person, place, and time.     Comments: forgetful             Vital Signs: BP 121/75 (BP Location: Left Arm)   Pulse 99   Temp 98.6 F (37 C) (Oral)   Resp 20   Ht 5' 3 (1.6 m)   Wt 56.7 kg   SpO2 96%   BMI 22.14 kg/m  SpO2: SpO2: 96 % O2 Device: O2 Device: Nasal Cannula O2 Flow Rate: O2 Flow Rate (L/min): 2 L/min  Intake/output summary:  Intake/Output Summary (Last 24 hours) at 10/14/2024 1056 Last data filed at 10/14/2024 0859 Gross per 24 hour  Intake 343.7 ml  Output 3000 ml  Net -2656.3 ml   LBM: Last BM Date : 10/13/24 Baseline Weight: Weight: 65.8 kg Most recent weight: Weight: 56.7 kg       Palliative  Assessment/Data: PPS 40%      Patient Active Problem List   Diagnosis Date Noted   Acute respiratory failure with hypoxia (HCC) 10/12/2024   Atrial fibrillation with RVR (HCC) 10/12/2024   CHF exacerbation (HCC) 10/12/2024   Hypertensive urgency 10/12/2024   Encephalopathy 10/12/2024   Swelling of lower extremity 10/12/2024   AV block,  1st degree 10/12/2024   Intertrochanteric fracture of left femur, closed, initial encounter (HCC) 05/22/2024   Fall 05/22/2024   Closed 2-part displaced fracture of surgical neck of left humerus 05/22/2024   Type 2 diabetes mellitus with hyperglycemia, without long-term current use of insulin  (HCC) 05/22/2024   Atrial fibrillation, chronic (HCC) 05/22/2024   Ischemic stroke (HCC) 04/25/2022   Stage 3a chronic kidney disease (CKD) (HCC) 04/25/2022   Arthropathy 11/28/2021   Ascending aorta dilatation 11/28/2021   Hardening of the aorta (main artery of the heart) 11/28/2021   Essential hypertension 11/28/2021   Insomnia 11/28/2021   Pure hypercholesterolemia 11/28/2021   Statin not tolerated 11/28/2021   Type 2 diabetes mellitus with other specified complication (HCC) 11/28/2021   Vitamin D  deficiency 11/28/2021   Osteoarthritis of left knee 04/14/2013   Meniscus, lateral, bucket handle tear, old 04/14/2013   Meniscus, medial, bucket handle tear, old 04/14/2013    Palliative Care Assessment & Plan   HPI: 89 y.o. female  with past medical history of type 2 diabetes, hypertension, hyperlipidemia, GERD, arthritis, and history of cryptogenic stroke  admitted on 10/12/2024 with acute respiratory failure with hypoxia, volume overload, encephalopathy, hypertensive urgency, and chest pain . Patient treated for CHF exacerbation and acute respiratory failure. She initially required bipap - now on nasal cannula. PMT consulted to discuss GOC.   Assessment: Follow up today. Patient is doing well - sitting up in chair. Still requires oxygen. No complaints.  Eager to go home.  Daughter at bedside. We review our conversation yesterday. Daughter has spoken with hospice liaison and feels even more strongly that hospice philosophy aligns with her mother's goals of care. Patient is a bit hesitant about having people in her home but willing to hear more from hospice.   Recommendations/Plan: Awaiting SLP eval Awaiting echo DNR/DNI Meeting with hospice liaison soon - daughter interested in hospice services at home Patient declines home health  Code Status: DNR  Discharge Planning: Home with Hospice  Care plan was discussed with patient and daughter  Thank you for allowing the Palliative Medicine Team to assist in the care of this patient.   I personally spent a total of 30 minutes in the care of the patient today including preparing to see the patient, performing a medically appropriate exam/evaluation, counseling and educating, documenting clinical information in the EHR, and coordinating care.    *Please note that this is a verbal dictation therefore any spelling or grammatical errors are due to the Dragon Medical One system interpretation.  Tobey Jama Barnacle, DNP, AGNP-C Palliative Medicine Team Team Phone # [phone]  Pager [phone]  "

## 2024-10-14 NOTE — Progress Notes (Signed)
 "  Progress Note  Patient Name: Rachel Hines Date of Encounter: 10/14/2024 [facility] Cardiologist: None   Interval Summary   Patient is doing well today, eager to go home ahead of the incoming inclement weather this weekend. Son is present at bedside. Patient states that she is feeling much better, dyspnea and lower extremity swelling has significantly improved. Ambulated with PT, required [place] to maintain O2 sats. Does not require supplemental oxygen at home PTA. Still having good UOP on IV Lasix . Denies any chest pain. Reports some palpitations all her life that she does not find to be bothersome. No new complaints or concerns at this time.   Vital Signs Vitals:   10/13/24 2120 10/13/24 2321 10/14/24 0323 10/14/24 0744  BP:  (!) 101/52 115/64 121/75  Pulse: 98 100 80 99  Resp:  16 (!) 25 20  Temp:  98.4 F (36.9 C) 98.3 F (36.8 C) 98.6 F (37 C)  TempSrc:  Oral Oral Oral  SpO2:  95% 98% 96%  Weight:   56.7 kg   Height:        Intake/Output Summary (Last 24 hours) at 10/14/2024 1047 Last data filed at 10/14/2024 0859 Gross per 24 hour  Intake 343.7 ml  Output 3000 ml  Net -2656.3 ml      10/14/2024    3:23 AM 10/13/2024    3:56 AM 10/12/2024   10:00 PM  Last 3 Weights  Weight (lbs) 125 lb 128 lb 1.4 oz 130 lb 8.2 oz  Weight (kg) 56.7 kg 58.1 kg 59.2 kg      Telemetry/ECG  Rate-controlled Afib - Personally Reviewed  Physical Exam  GEN: Elderly female sitting upright in reclined chair with legs elevated, on 2L/min O2 via [place], in no acute distress.   Neck: No JVD Cardiac: Irregularly irregular rhythm, normal rate, no murmurs, rubs, or gallops.  Respiratory: Mild crackles at bases GI: Soft, nontender MS: 1+ pitting edema of bilateral lower extremities  Assessment & Plan  Acute on chronic HF Presented with dyspnea and [place] Hamler edema, transferred from [facility] to [facility] for management of hypoxemia requiring BiPAP, now weaned to [facility] ProBNP 6,813 Respiratory panel  negative Normal Cr, ALT/AST, albumin CXR showed cardiomegaly with vascular congestion and small bilateral pleural effusions Net I/O -4.5L with -1.2L in the past 24 hours (+550 mL in reservoir at time of interview) Weight 59.2 kg >> 56.7 kg Continue diuresis with IV Lasix  40 mg BID Continue strict I/Os, daily BMPs, daily weights  Paroxysmal atrial fibrillation History of cryptogenic stroke Home meds: atenolol  25 mg daily, Eliquis  2.5 mg BID Previously followed by EP as outpatient ILR placed in September 2023 showed runs of Afib Most recent echo 2023 with preserved EF, indeterminate diastolic parameters, normal RV, normal LA size, mild AI. Echo this admission awaiting formal read Normal TSH, K Patient reports occasional palpitations but does not find it bothersome, otherwise unsure of symptomatic burden due to concurrent ADHF. She is unable to tell me her preferences regarding cardioversion. Of note, she is not a candidate for TEE due to ongoing swallowing difficulty with history of esophageal stricture, so will need to confirm at least 3 weeks of uninterrupted anticoagulation if considering DCCV in the future. However, will focus on rate control at this time Rate-controlled Afib in the 80s-90s on telemetry. Continue Lopressor  12.5 mg BID for rate control with hold parameters given variable BP CHA2DS2-VASc score of 8. Continue Eliquis  2.5 mg BID (age >80, weight <60 kg)  Acute respiratory  failure with hypoxia In the setting of pulmonary edema amid ADHF, initially requiring BiPAP now weaned to [facility] Improving with diuresis  Hypertension BP variable, soft/normotensive. Continue Lopressor  12.5 mg BID with hold parameters  Goals of care Code status now changed to DNR/DNI Per palliative care, consults to [facility] and hospice liaison to discuss hospice at home  Per primary Type 2 diabetes mellitus GERD Arthritis CKD III  For questions or updates, please contact [facility] Please  consult [email or web address] for contact info under         Signed, Owen MARLA Daniels, PA-C   "

## 2024-10-14 NOTE — Progress Notes (Signed)
 SATURATION QUALIFICATIONS: (This note is used to comply with regulatory documentation for home oxygen)  Patient Saturations on Room Air at Rest = 96%  Patient Saturations on Room Air while Ambulating = 85%  Patient Saturations on 2 Liters of oxygen while Ambulating = 90%  Please briefly explain why patient needs home oxygen: Pt desaturating on RA while ambulating requiring 2L of oxygen to maintain an SpO2 > 90%.    Leontine Hilt DPT Acute Rehab Services [phone] Prefer contact via chat

## 2024-10-14 NOTE — Progress Notes (Signed)
 Heart Failure Navigator Progress Note  Assessed for Heart & Vascular TOC clinic readiness.  Patient does not meet criteria due to EF 55 %, per Palliative meeting . Daughter has spoken with hospice liaison and feels even more strongly that hospice philosophy aligns with her mother's goals of care.. No HF TOC.   Navigator will sign off at this time.   Stephane Haddock, BSN, Scientist, Clinical (histocompatibility And Immunogenetics) Only

## 2024-10-14 NOTE — Evaluation (Addendum)
 Clinical/Bedside Swallow Evaluation Patient Details  Name: Rachel Hines MRN: [id number] Date of Birth: Feb 18, 1931  Today's Date: 10/14/2024 Time: SLP Start Time (ACUTE ONLY): 1134 SLP Stop Time (ACUTE ONLY): 1151 SLP Time Calculation (min) (ACUTE ONLY): 17 min  Past Medical History:  Past Medical History:  Diagnosis Date   Acute meniscal injury of left knee    Arthritis    Atrial fibrillation (HCC)    GERD (gastroesophageal reflux disease)    H/O hiatal hernia    Heart block AV first degree    History of esophageal dilatation    Hyperlipidemia    Hypertension    Impaired hearing    Left knee pain    severe   Type 2 diabetes mellitus (HCC)    Wears hearing aid    Past Surgical History:  Past Surgical History:  Procedure Laterality Date   ABDOMINAL HYSTERECTOMY  1960'S   CATARACT EXTRACTION W/ INTRAOCULAR LENS IMPLANT Right    CHONDROPLASTY  04/14/2013   Procedure:  ABRATION CHONDROPLASTY OF MEDIAL CHONDIAL;  Surgeon: Tanda DELENA Heading, MD;  Location: [facility];  Service: Orthopedics;;   INTRAMEDULLARY (IM) NAIL INTERTROCHANTERIC Left 05/23/2024   Procedure: FIXATION, FRACTURE, INTERTROCHANTERIC, WITH INTRAMEDULLARY ROD;  Surgeon: Celena Sharper, MD;  Location: [facility] OR;  Service: Orthopedics;  Laterality: Left;   KNEE ARTHROSCOPY WITH LATERAL MENISECTOMY Left 04/14/2013   Procedure: KNEE ARTHROSCOPY WITH LATERAL MENISECTOMY;  Surgeon: Tanda DELENA Heading, MD;  Location: [facility];  Service: Orthopedics;  Laterality: Left;   KNEE ARTHROSCOPY WITH MEDIAL MENISECTOMY Left 04/14/2013   Procedure: LEFT KNEE ARTHROSCOPY WITH MEDIAL MENISECTOMY;  Surgeon: Tanda DELENA Heading, MD;  Location: [facility];  Service: Orthopedics;  Laterality: Left;   TOTAL KNEE ARTHROPLASTY Right 2011   HPI:  Rachel Hines is a 89 yo female admitted to [facility] on 10/12/24 with acute respiratory failure with hypoxia requiring BiPaP initially, now on [place]. PmHx of type 2  diabetes, hypertension, hyperlipidemia, GERD, arthritis, and history of cryptogenic stroke. SLP consulted for clinical swallow evaluation.    Assessment / Plan / Recommendation  Clinical Impression   Pt presents with a functional oropharyngeal swallow per clinical swallow assessment completed today. Oral prep and transit prompt with complete oral clearance. Pharyngeal swallow initiation appeared timely with laryngeal elevation noted. No overt or subtle s/s of aspiration noted across consistencies.   Pt did report intermittent s/s of esophageal dysphagia including globus sensation and reflux. Per daughter, esophageal dysphagia is a chronic issue and patient has declined further follow up with GI. She had an esophageal dilation over 40 years ago. We discussed conservative measures including diet modification, sleeping elevated, and remaining upright for >60 mins post meals. Discussed alginate-based therapies such as Reflux Gourmet or RefluxRaft that pt can try. Deferred questions about EGD to GI. We discussed first step would be an esophagram; however, pt politely declined at this time.   Recommend continue current diet at tolerated and PO meds as tolerated. No acute SLP needs identified at this time. SLP will sign off. Please re-consult if pt exhibits concerns for aspiration with PO intake.   SLP Visit Diagnosis: Dysphagia, unspecified (R13.10) (suspect primary esophageal dysphagia given hx of esophageal stricture)    Aspiration Risk  Mild aspiration risk (risk of postprandial aspiration)    Diet Recommendation Regular;Thin liquid    Liquid Administration via: Cup;Straw Medication Administration: Whole meds with liquid Supervision: Patient able to self feed Compensations: Slow rate;Small sips/bites Postural Changes: Seated upright at 90  degrees;Remain upright for at least 30 minutes after po intake    Other Recommendations Recommended Consults: Consider GI evaluation;Consider esophageal  assessment Oral Care Recommendations: Oral care BID      Functional Status Assessment Patient has not had a recent decline in their functional status  Frequency and Duration   N/a; d/c acute SLP         Prognosis Prognosis for improved oropharyngeal function: Good      Swallow Study   General Date of Onset: 10/14/24 HPI: Rachel Hines is a 89 yo female admitted to [facility] on 10/12/24 with acute respiratory failure with hypoxia requiring BiPaP initially, now on [place]. PmHx of type 2 diabetes, hypertension, hyperlipidemia, GERD, arthritis, and history of cryptogenic stroke. SLP consulted for clinical swallow evaluation. Type of Study: Bedside Swallow Evaluation Previous Swallow Assessment: Clinical swallow assessment 05/24/24 with recommendation of a regular diet and thin liquids;  known hx of esophageal dysphagia. Diet Prior to this Study: Regular;Thin liquids (Level 0) Temperature Spikes Noted: No Respiratory Status: Nasal cannula (2L) History of Recent Intubation: No Behavior/Cognition: Alert;Cooperative;Pleasant mood Oral Cavity Assessment: Within Functional Limits Oral Care Completed by SLP: No Oral Cavity - Dentition: Dentures, top;Dentures, bottom Vision: Functional for self-feeding Self-Feeding Abilities: Able to feed self Patient Positioning: Upright in chair Baseline Vocal Quality: Normal Volitional Cough: Strong Volitional Swallow: Able to elicit    Oral/Motor/Sensory Function Overall Oral Motor/Sensory Function: Within functional limits   Ice Chips Ice chips: Not tested   Thin Liquid Thin Liquid: Within functional limits Presentation: Self Fed;Cup    Nectar Thick Nectar Thick Liquid: Not tested   Honey Thick Honey Thick Liquid: Not tested   Puree Puree: Not tested   Solid     Solid: Within functional limits Presentation: Self Fed      Peyton Rachel Hines 10/14/2024,12:52 PM

## 2024-10-15 ENCOUNTER — Other Ambulatory Visit (HOSPITAL_COMMUNITY): Payer: Self-pay

## 2024-10-15 DIAGNOSIS — I440 Atrioventricular block, first degree: Secondary | ICD-10-CM

## 2024-10-15 DIAGNOSIS — I480 Paroxysmal atrial fibrillation: Secondary | ICD-10-CM | POA: Diagnosis not present

## 2024-10-15 DIAGNOSIS — J9601 Acute respiratory failure with hypoxia: Secondary | ICD-10-CM | POA: Diagnosis not present

## 2024-10-15 MED ORDER — FUROSEMIDE 40 MG PO TABS
40.0000 mg | ORAL_TABLET | Freq: Every day | ORAL | 0 refills | Status: AC
  Filled 2024-10-15: qty 30, 30d supply, fill #0

## 2024-10-15 MED ORDER — METOPROLOL TARTRATE 25 MG PO TABS
12.5000 mg | ORAL_TABLET | Freq: Two times a day (BID) | ORAL | 0 refills | Status: AC
  Filled 2024-10-15: qty 11, 11d supply, fill #0

## 2024-10-15 NOTE — Progress Notes (Incomplete)
 [facility]  PROGRESS NOTE  Rachel Hines FMW:[id number] DOB: April 13, 1931 DOA: 10/12/2024 PCP: Claudene Pellet, MD   Brief HPI:   89 y.o. female with medical history significant of multiple medical issues including type 2 diabetes, hypertension, hyperlipidemia, GERD, arthritis history of cryptogenic stroke presenting with acute respiratory failure with hypoxia, volume overload, encephalopathy, hypertensive urgency, chest pain.  History primarily from patient's daughter at the bedside [name] in the setting of encephalopathy.  Per report, patient with increased work of breathing over the past 2 to 3 days and acutely worsening over the past 12 to 24 hours. Daughter denies any known history of heart failure in the past. Daughter does report recent high salt intake recently. Follows Dr. Waddell outpatient. On eliquis . Daughter reports compliance with medication. No fevers or chills. No recent falls. Pt lives at home alone per report.   Presented to ER afebrile, HR into 110s, RR in 20s-30s, BP 140s-190s/100s-130s. Desatting in 80s on RA. Transitioned to BIPAP. WBC 5.7, hgb 10.4, plt 180s. Pro BNP 6813.  CXR w/ cardiomegaly, vascular congestion, and small bilateral pleural effusions. LE u/s negative for DVT. EKG w/ afib w/ HR in 110s.     Assessment/Plan:    Acute respiratory failure with hypoxia (HCC) -In setting of pulmonary edema, required BiPAP -Chest x-ray showed cardiomegaly with pulmonary edema -BNP elevated 6800 -Respiratory status has improved with diuresis      CHF exacerbation (HCC) 2D ECHO 04/2022 w/ EF 60-65%, trivial MR, mild AR  Does not appear to have formal diagnosis of heart failure prior to presentation  Progressive shortness of breath over the past 1 to 2 days with noted high salt intake, lower extremity swelling ProBNP in the 6800's with noted vascular congestion and pulmonary edema on chest x-ray -Started on Lasix  40 mg IV every 12 hours; net -4.8 L Strict Is and Os and  daily weights  Heart failure pathway  2D ECHO  Follow up cardiology recommendations       Encephalopathy -Resolved With noted progressive confusion and agitation in the setting of acute respiratory failure with hypoxia Actively agitated on BiPAP Pending as needed Ativan  for agitation in the ER Add on Zyprexa  in the interim-advance to Haldol as appropriate No active infection noted at present CT head is appropriate Suspect change, remote history of CVA and likely subclinical dementia as confounding issues   Atrial fibrillation with RVR (HCC) Baseline atrial fibrillation  On Eliquis  and atenolol  chronically Cardiology following  AV block, 1st degree Baseline history of 1st AV block s/p pacemaker  Monitor      Swelling of lower extremity -Swelling improved with diuresis Noted bilateral lower extremity swelling in the setting of volume overload with likely etiology of heart failure Lower extremity ultrasound negative for DVT    Hypertensive urgency Chest Pain  Suspect secondary to volume overload Troponin negative x 2 NTG gtt in place  EKG A-fib with heart rate into the 110s Status post IV Lasix       Stage 3a chronic kidney disease (CKD) (HCC) Cr 0.86 today w/ GFR>60  Monitor      Type 2 diabetes mellitus with other specified complication (HCC) Blood sugars in low 100s  SSI as appropriate  Monitor          DVT prophylaxis: Apixaban   Medications     apixaban   2.5 mg Oral BID   feeding supplement  237 mL Oral BID BM   furosemide   40 mg Intravenous Q12H   metoprolol  tartrate  12.5 mg  Oral BID     Data Reviewed:   CBG:  No results for input(s): GLUCAP in the last 168 hours.  SpO2: 98 % O2 Flow Rate (L/min): 2 L/min FiO2 (%): 28 %    Vitals:   10/14/24 1913 10/14/24 2338 10/15/24 0304 10/15/24 0748  BP: (!) 96/56 118/77 (!) 106/50 100/74  Pulse: 90  73 99  Resp: (!) 22 19 14 20   Temp: 97.9 F (36.6 C) 98.3 F (36.8 C) 97.6 F (36.4 C)  (!) 97.4 F (36.3 C)  TempSrc: Oral Oral Oral Oral  SpO2: 93%  (!) 87% 98%  Weight:   53.4 kg   Height:          Data Reviewed:  Basic Metabolic Panel: Recent Labs  Lab 10/12/24 1311 10/12/24 1805 10/12/24 1930 10/13/24 0201  NA 136 135 136 137  K 5.2* 4.4 4.2 4.0  CL 99  --  97* 96*  CO2 26  --  28 27  GLUCOSE 104*  --  145* 101*  BUN 17  --  19 18  CREATININE 0.86  --  0.96 0.92  CALCIUM  9.6  --  9.7 9.1    CBC: Recent Labs  Lab 10/12/24 1311 10/12/24 1805 10/13/24 0201  WBC 5.7  --  7.0  NEUTROABS 4.3  --   --   HGB 10.4* 12.6 11.4*  HCT 31.6* 37.0 35.6*  MCV 89.5  --  90.4  PLT 188  --  196    LFT Recent Labs  Lab 10/12/24 1930 10/13/24 0201  AST 19 17  ALT 7 7  ALKPHOS 99 86  BILITOT 1.1 0.9  PROT 6.6 6.1*  ALBUMIN 4.1 3.5     Antibiotics: Anti-infectives (From admission, onward)    None        CONSULTS cardiology  Code Status: DNR  Family Communication: Discussed with patient's daughter at bedside     Subjective      Objective    Physical Examination:             [name]   [facility] If 7PM-7AM, please contact night-coverage at [email or web address], Office  [phone]   10/15/2024, 9:59 AM  LOS: 3 days

## 2024-10-15 NOTE — Discharge Summary (Signed)
 " Physician Discharge Summary   Patient: Rachel Hines MRN: [id number] DOB: 01-03-1931  Admit date:     10/12/2024  Discharge date: 10/15/24  Discharge Physician: [name]   PCP: Claudene Pellet, MD   Recommendations at discharge:   Patient to follow-up with [facility] hospice at home  Discharge Diagnoses: Principal Problem:   Acute respiratory failure with hypoxia [facility]) Active Problems:   CHF exacerbation (HCC)   Atrial fibrillation with RVR (HCC)   Encephalopathy   Type 2 diabetes mellitus with other specified complication (HCC)   Stage 3a chronic kidney disease (CKD) (HCC)   Hypertensive urgency   Swelling of lower extremity   AV block, 1st degree   Paroxysmal atrial fibrillation (HCC)   Acute on chronic diastolic heart failure (HCC)  Resolved Problems:   * No resolved hospital problems. *  Hospital Course: 89 y.o. female with medical history significant of multiple medical issues including type 2 diabetes, hypertension, hyperlipidemia, GERD, arthritis history of cryptogenic stroke presenting with acute respiratory failure with hypoxia, volume overload, encephalopathy, hypertensive urgency, chest pain.  History primarily from patient's daughter at the bedside Rachel Hines in the setting of encephalopathy.  Per report, patient with increased work of breathing over the past 2 to 3 days and acutely worsening over the past 12 to 24 hours. Daughter denies any known history of heart failure in the past. Daughter does report recent high salt intake recently. Follows Dr. Waddell outpatient. On eliquis . Daughter reports compliance with medication. No fevers or chills. No recent falls. Pt lives at home alone per report.   Presented to ER afebrile, HR into 110s, RR in 20s-30s, BP 140s-190s/100s-130s. Desatting in 80s on RA. Transitioned to BIPAP. WBC 5.7, hgb 10.4, plt 180s. Pro BNP 6813.  CXR w/ cardiomegaly, vascular congestion, and small bilateral pleural effusions. LE u/s negative for DVT.  EKG w/ afib w/ HR in 110s.     Assessment and Plan:   Acute respiratory failure with hypoxia (HCC) -In setting of pulmonary edema, required BiPAP -Chest x-ray showed cardiomegaly with pulmonary edema -BNP elevated 6800 -Respiratory status has improved with diuresis - Patient will be discharged on p.o. Lasix  -Patient to go home with hospice       CHF exacerbation (HCC) 2D ECHO 04/2022 w/ EF 60-65%, trivial MR, mild AR  Does not appear to have formal diagnosis of heart failure prior to presentation  Progressive shortness of breath over the past 1 to 2 days with noted high salt intake, lower extremity swelling ProBNP in the 6800's with noted vascular congestion and pulmonary edema on chest x-ray -Started on Lasix  40 mg IV every 12 hours; net -4.8 L Patient is on comfort measures, going home with hospice -Will discharge on Lasix  40 mg daily      Encephalopathy -Resolved With noted progressive confusion and agitation in the setting of acute respiratory failure with hypoxia Actively agitated on BiPAP Pending as needed Ativan  for agitation in the ER Add on Zyprexa  in the interim-advance to Haldol as appropriate No active infection noted at present CT head is appropriate Suspect change, remote history of CVA and likely subclinical dementia as confounding issues   Atrial fibrillation with RVR (HCC) Baseline atrial fibrillation  On Eliquis  and atenolol  chronically Cardiology following -Cardiology recommends to discontinue Eliquis  as patient is going home with hospice -Will switch atenolol  to metoprolol  12.5 mg p.o. twice daily   AV block, 1st degree Baseline history of 1st AV block s/p pacemaker  Swelling of lower extremity -Swelling improved with diuresis Noted bilateral lower extremity swelling in the setting of volume overload with likely etiology of heart failure Lower extremity ultrasound negative for DVT     Hypertensive urgency Chest Pain  Resolved               Consultants: Cardiology Procedures performed:  Disposition: Home Diet recommendation:  Regular diet DISCHARGE MEDICATION: Allergies as of 10/15/2024       Reactions   Atorvastatin Other (See Comments)   Severe muscle pain and bone pain   Other Rash, Other (See Comments)   Food dyes- Rashes and joint pain        Medication List     STOP taking these medications    apixaban  2.5 MG Tabs tablet Commonly known as: ELIQUIS    atenolol  25 MG tablet Commonly known as: Tenormin        TAKE these medications    acetaminophen  500 MG tablet Commonly known as: TYLENOL  Take 2 tablets (1,000 mg total) by mouth every 8 (eight) hours as needed for mild pain (pain score 1-3) or moderate pain (pain score 4-6).   bisacodyl  5 MG EC tablet Generic drug: bisacodyl  Take 5 mg by mouth at bedtime.   furosemide  40 MG tablet Commonly known as: Lasix  Take 1 tablet (40 mg total) by mouth daily.   metoprolol  tartrate 25 MG tablet Commonly known as: LOPRESSOR  Take 0.5 tablets (12.5 mg total) by mouth 2 (two) times daily.   omeprazole 20 MG capsule Commonly known as: PRILOSEC Take 20 mg by mouth daily.   rosuvastatin  5 MG tablet Commonly known as: CRESTOR  TAKE 1 TABLET BY MOUTH DAILY   sertraline 50 MG tablet Commonly known as: ZOLOFT Take 75 mg by mouth daily.   traMADol 50 MG tablet Commonly known as: ULTRAM Take 50 mg by mouth 3 (three) times daily as needed for moderate pain (pain score 4-6).   zolpidem  10 MG tablet Commonly known as: AMBIEN  Take 10 mg by mouth at bedtime.        Follow-up Information     Claudene Pellet, MD Follow up on 10/25/2024.   Specialty: Family Medicine Why: 9:15 for hospital follow up Contact information: [address] [place] [address] [phone]                Discharge Exam: [name]   10/13/24 0356 10/14/24 0323 10/15/24 0304  Weight: 58.1 kg 56.7 kg 53.4 kg   General-appears in no acute  distress Heart-S1-S2, regular, no murmur auscultated Lungs-clear to auscultation bilaterally, no wheezing or crackles auscultated Abdomen-soft, nontender, no organomegaly Extremities-no edema in the lower extremities Neuro-alert, oriented x3, no focal deficit noted  Condition at discharge: good  The results of significant diagnostics from this hospitalization (including imaging, microbiology, ancillary and laboratory) are listed below for reference.   Imaging Studies: ECHOCARDIOGRAM COMPLETE Result Date: 10/14/2024    ECHOCARDIOGRAM REPORT   Patient Name:   SHELLIA HARTL Date of Exam: 10/14/2024 Medical Rec #:  [id number]      Height:       63.0 in Accession #:    [id number]     Weight:       125.0 lb Date of Birth:  Jul 17, 1931      BSA:          1.584 m Patient Age:    89 years       BP:           121/75 mmHg  Patient Gender: F              HR:           93 bpm. Exam Location:  Inpatient Procedure: 2D Echo, Cardiac Doppler and Color Doppler (Both Spectral and Color            Flow Doppler were utilized during procedure). Indications:    Congestive Heart Failure I50.9  History:        Patient has prior history of Echocardiogram examinations, most                 recent 04/26/2022. Arrythmias:Atrial Fibrillation; Risk                 Factors:Diabetes and Hypertension.  Sonographer:    Sydnee Wilson RDCS Referring Phys: [phone] STEVEN J NEWTON IMPRESSIONS  1. Left ventricular ejection fraction, by estimation, is 55%. Left ventricular ejection fraction by 2D MOD biplane is 55.3 %. The left ventricle has normal function. The left ventricle has no regional wall motion abnormalities. Diastology is indeterminate due to atrial fibrillation.  2. Right ventricular systolic function is low normal. The right ventricular size is normal. There is mildly elevated pulmonary artery systolic pressure.  3. Left atrial size was mild to moderately dilated.  4. The mitral valve is normal in structure. Mild mitral valve  regurgitation. No evidence of mitral stenosis.  5. The aortic valve is tricuspid. Aortic valve regurgitation is moderate. Aortic valve sclerosis/calcification is present, without any evidence of aortic stenosis.  6. The inferior vena cava is normal in size with <50% respiratory variability, suggesting right atrial pressure of 8 mmHg. Comparison(s): Changes from prior study are noted. Moderate aortic regurgitation now present. FINDINGS  Left Ventricle: Left ventricular ejection fraction, by estimation, is 55%. Left ventricular ejection fraction by 2D MOD biplane is 55.3 %. The left ventricle has normal function. The left ventricle has no regional wall motion abnormalities. The left ventricular internal cavity size was normal in size. There is borderline concentric left ventricular hypertrophy. Diastology is indeterminate due to atrial fibrillation. Right Ventricle: The right ventricular size is normal. No increase in right ventricular wall thickness. Right ventricular systolic function is low normal. There is mildly elevated pulmonary artery systolic pressure. The tricuspid regurgitant velocity is 2.71 m/s, and with an assumed right atrial pressure of 8 mmHg, the estimated right ventricular systolic pressure is 37.4 mmHg. Left Atrium: Left atrial size was mild to moderately dilated. Right Atrium: Right atrial size was normal in size. Pericardium: There is no evidence of pericardial effusion. Mitral Valve: The mitral valve is normal in structure. Mild mitral valve regurgitation. No evidence of mitral valve stenosis. Tricuspid Valve: The tricuspid valve is normal in structure. Tricuspid valve regurgitation is mild . No evidence of tricuspid stenosis. Aortic Valve: The aortic valve is tricuspid. Aortic valve regurgitation is moderate. Aortic regurgitation PHT measures 163 msec. Aortic valve sclerosis/calcification is present, without any evidence of aortic stenosis. Pulmonic Valve: The pulmonic valve was normal in  structure. Pulmonic valve regurgitation is not visualized. No evidence of pulmonic stenosis. Aorta: The aortic root and ascending aorta are structurally normal, with no evidence of dilitation. Venous: The inferior vena cava is normal in size with less than 50% respiratory variability, suggesting right atrial pressure of 8 mmHg. IAS/Shunts: No atrial level shunt detected by color flow Doppler.  LEFT VENTRICLE PLAX 2D  Biplane EF (MOD) LVIDd:         3.10 cm         LV Biplane EF:   Left LVIDs:         2.20 cm                          ventricular LV PW:         1.20 cm                          ejection LV IVS:        0.90 cm                          fraction by LVOT diam:     1.80 cm                          2D MOD LV SV:         61                               biplane is LV SV Index:   38                               55.3 %. LVOT Area:     2.54 cm                                Diastology                                LV e' medial:    7.72 cm/s LV Volumes (MOD)               LV E/e' medial:  14.9 LV vol d, MOD    61.2 ml       LV e' lateral:   9.79 cm/s A2C:                           LV E/e' lateral: 11.7 LV vol d, MOD    69.2 ml A4C: LV vol s, MOD    29.1 ml A2C: LV vol s, MOD    29.2 ml A4C: LV SV MOD A2C:   32.1 ml LV SV MOD A4C:   69.2 ml LV SV MOD BP:    36.7 ml RIGHT VENTRICLE RV S prime:     12.10 cm/s TAPSE (M-mode): 1.1 cm LEFT ATRIUM             Index        RIGHT ATRIUM           Index LA diam:        2.90 cm 1.83 cm/m   RA Area:     14.20 cm LA Vol (A2C):   36.1 ml 22.80 ml/m  RA Volume:   32.50 ml  20.52 ml/m LA Vol (A4C):   54.5 ml 34.42 ml/m LA Biplane Vol: 46.9 ml 29.62 ml/m  AORTIC VALVE LVOT Vmax:   126.00 cm/s LVOT Vmean:  73.100 cm/s LVOT VTI:    0.238 m AI PHT:  163 msec  AORTA Ao Root diam: 2.60 cm Ao Asc diam:  2.50 cm MITRAL VALVE                TRICUSPID VALVE MV Area (PHT): 4.04 cm     TR Peak grad:   29.4 mmHg MV Decel Time: 188 msec     TR Vmax:         271.00 cm/s MV E velocity: 115.00 cm/s MV A velocity: 21.50 cm/s   SHUNTS MV E/A ratio:  5.35         Systemic VTI:  0.24 m                             Systemic Diam: 1.80 cm Georganna Archer Electronically signed by Georganna Archer Signature Date/Time: 10/14/2024/1:24:38 PM    Final    US  Venous Img Lower Bilateral Result Date: 10/12/2024 CLINICAL DATA:  Swelling EXAM: Bilateral Lower Extremity Venous Doppler Ultrasound TECHNIQUE: Gray-scale sonography with compression, as well as color and duplex ultrasound, were performed to evaluate the deep venous system(s) from the level of the common femoral vein through the popliteal and proximal calf veins. COMPARISON:  None available FINDINGS: VENOUS Normal compressibility of the common femoral, femoral, and popliteal veins, as well as the visualized calf veins. Visualized portions of profunda femoral vein and great saphenous vein unremarkable. No filling defects to suggest DVT on grayscale or color Doppler imaging. Doppler waveforms show normal direction of venous flow, normal respiratory plasticity and response to augmentation. OTHER None. Limitations: none IMPRESSION: No lower extremity DVT. Electronically Signed   By: Aliene Lloyd M.D.   On: 10/12/2024 16:23   DG Chest Port 1 View Result Date: 10/12/2024 CLINICAL DATA:  Shortness of breath. EXAM: PORTABLE CHEST 1 VIEW COMPARISON:  Chest radiograph dated 07/01/2024. FINDINGS: Cardiomegaly with vascular congestion and small bilateral pleural effusions. No pneumothorax. Loop recorder device. No acute osseous pathology. Osteopenia with degenerative changes. IMPRESSION: Cardiomegaly with vascular congestion and small bilateral pleural effusions. Electronically Signed   By: Vanetta Chou M.D.   On: 10/12/2024 14:04    Microbiology: Results for orders placed or performed during the hospital encounter of 10/12/24  Resp panel by RT-PCR (RSV, Flu A&B, Covid) Anterior Nasal Swab     Status: None   Collection Time:  10/12/24  1:11 PM   Specimen: Anterior Nasal Swab  Result Value Ref Range Status   SARS Coronavirus 2 by RT PCR NEGATIVE NEGATIVE Final    Comment: (NOTE) SARS-CoV-2 target nucleic acids are NOT DETECTED.  The SARS-CoV-2 RNA is generally detectable in upper respiratory specimens during the acute phase of infection. The lowest concentration of SARS-CoV-2 viral copies this assay can detect is 138 copies/mL. A negative result does not preclude SARS-Cov-2 infection and should not be used as the sole basis for treatment or other patient management decisions. A negative result may occur with  improper specimen collection/handling, submission of specimen other than nasopharyngeal swab, presence of viral mutation(s) within the areas targeted by this assay, and inadequate number of viral copies(<138 copies/mL). A negative result must be combined with clinical observations, patient history, and epidemiological information. The expected result is Negative.  Fact Sheet for Patients:  bloggercourse.com  Fact Sheet for Healthcare Providers:  seriousbroker.it  This test is no t yet approved or cleared by the [place]  FDA and  has been authorized for detection and/or diagnosis of SARS-CoV-2 by FDA under an Emergency Use Authorization (EUA).  This EUA will remain  in effect (meaning this test can be used) for the duration of the COVID-19 declaration under Section 564(b)(1) of the Act, 21 U.S.C.section 360bbb-3(b)(1), unless the authorization is terminated  or revoked sooner.       Influenza A by PCR NEGATIVE NEGATIVE Final   Influenza B by PCR NEGATIVE NEGATIVE Final    Comment: (NOTE) The Xpert Xpress SARS-CoV-2/FLU/RSV plus assay is intended as an aid in the diagnosis of influenza from Nasopharyngeal swab specimens and should not be used as a sole basis for treatment. Nasal washings and aspirates are unacceptable for Xpert Xpress  SARS-CoV-2/FLU/RSV testing.  Fact Sheet for Patients: bloggercourse.com  Fact Sheet for Healthcare Providers: seriousbroker.it  This test is not yet approved or cleared by the [place]  FDA and has been authorized for detection and/or diagnosis of SARS-CoV-2 by FDA under an Emergency Use Authorization (EUA). This EUA will remain in effect (meaning this test can be used) for the duration of the COVID-19 declaration under Section 564(b)(1) of the Act, 21 U.S.C. section 360bbb-3(b)(1), unless the authorization is terminated or revoked.     Resp Syncytial Virus by PCR NEGATIVE NEGATIVE Final    Comment: (NOTE) Fact Sheet for Patients: bloggercourse.com  Fact Sheet for Healthcare Providers: seriousbroker.it  This test is not yet approved or cleared by the [place]  FDA and has been authorized for detection and/or diagnosis of SARS-CoV-2 by FDA under an Emergency Use Authorization (EUA). This EUA will remain in effect (meaning this test can be used) for the duration of the COVID-19 declaration under Section 564(b)(1) of the Act, 21 U.S.C. section 360bbb-3(b)(1), unless the authorization is terminated or revoked.  Performed at [facility], [address], [place], [place] [address]     Labs: CBC: Recent Labs  Lab 10/12/24 1311 10/12/24 1805 10/13/24 0201  WBC 5.7  --  7.0  NEUTROABS 4.3  --   --   HGB 10.4* 12.6 11.4*  HCT 31.6* 37.0 35.6*  MCV 89.5  --  90.4  PLT 188  --  196   Basic Metabolic Panel: Recent Labs  Lab 10/12/24 1311 10/12/24 1805 10/12/24 1930 10/13/24 0201  NA 136 135 136 137  K 5.2* 4.4 4.2 4.0  CL 99  --  97* 96*  CO2 26  --  28 27  GLUCOSE 104*  --  145* 101*  BUN 17  --  19 18  CREATININE 0.86  --  0.96 0.92  CALCIUM  9.6  --  9.7 9.1   Liver Function Tests: Recent Labs  Lab 10/12/24 1930 10/13/24 0201   AST 19 17  ALT 7 7  ALKPHOS 99 86  BILITOT 1.1 0.9  PROT 6.6 6.1*  ALBUMIN 4.1 3.5   CBG: No results for input(s): GLUCAP in the last 168 hours.  Discharge time spent: greater than 30 minutes.  Signed: [name], MD [facility] 10/15/2024 "

## 2024-10-15 NOTE — TOC Transition Note (Signed)
 Transition of Care [facility]) - Discharge Note   Patient Details  Name: Rachel Hines MRN: [id number] Date of Birth: May 03, 1931  Transition of Care [facility]) CM/SW Contact:  Marval Gell, RN Phone Number: 10/15/2024, 10:31 AM   Clinical Narrative:     Beatris w patient's daughter over the phone. She confirms they have needed DME at home including home oxygen.  She confirms they will be transporting home by private vehicle.  Oxygen was delivered to home through [facility]. I reached out to [facility] to confirm they are following and to notify of DC today.  Requested MD to write DC and send meds through [facility] pharmacy.         Patient Goals and CMS Choice            Discharge Placement                       Discharge Plan and Services Additional resources added to the After Visit Summary for                                       Social Drivers of Health (SDOH) Interventions SDOH Screenings   Food Insecurity: No Food Insecurity (10/12/2024)  Housing: Low Risk (10/12/2024)  Transportation Needs: No Transportation Needs (10/12/2024)  Utilities: Not At Risk (10/12/2024)  Social Connections: Socially Isolated (10/12/2024)  Tobacco Use: Low Risk (10/12/2024)     Readmission Risk Interventions    10/13/2024    2:53 PM  Readmission Risk Prevention Plan  Transportation Screening Complete  PCP or Specialist Appt within 5-7 Days Complete  Home Care Screening Complete  Medication Review (RN CM) Complete

## 2024-10-15 NOTE — Plan of Care (Signed)
   Problem: Health Behavior/Discharge Planning: Goal: Ability to manage health-related needs will improve Outcome: Progressing

## 2024-10-15 NOTE — Progress Notes (Signed)
 [facility] [phone] [facility] Liaison Note  Received request for for hospice services at home after discharge.  Plan is for patient to discharge home via private vehicle.   DME needs discussed. Requested O2 at 2 lpm per [place]. Ordered and delivered.  Please send completed and signed DNR with patient at discharge.  Please provide prescriptions as needed to ensure ongoing symptom management.  Please call with any questions or concerns.  Thank you, Randine Nail, BSN, [facility]  [phone]

## 2024-10-15 NOTE — Progress Notes (Signed)
 DISCHARGE NOTE HOME ODESSIA ASLESON to be discharged Home per MD order. Discussed prescriptions and follow up appointments with the patient. Prescriptions given to patient; medication list explained in detail. Patient verbalized understanding.  Skin clean, dry and intact without evidence of skin break down, no evidence of skin tears noted. IV catheter discontinued intact. Site without signs and symptoms of complications. Dressing and pressure applied. Pt denies pain at the site currently. No complaints noted.  Patient free of lines, drains, and wounds.   An After Visit Summary (AVS) was printed and given to the patient. Patient escorted via wheelchair, and discharged home via private auto.  Peyton SHAUNNA Pepper, RN

## 2024-11-07 ENCOUNTER — Ambulatory Visit

## 2024-12-08 ENCOUNTER — Ambulatory Visit

## 2025-01-08 ENCOUNTER — Ambulatory Visit

## 2025-02-08 ENCOUNTER — Ambulatory Visit

## 2025-03-11 ENCOUNTER — Ambulatory Visit

## 2025-04-11 ENCOUNTER — Ambulatory Visit

## 2025-05-12 ENCOUNTER — Ambulatory Visit

## 2025-06-12 ENCOUNTER — Ambulatory Visit

## 2025-07-13 ENCOUNTER — Ambulatory Visit

## 2025-08-13 ENCOUNTER — Ambulatory Visit

## 2025-09-13 ENCOUNTER — Ambulatory Visit
